# Patient Record
Sex: Female | Born: 1960
Health system: Southern US, Community
[De-identification: ages and names within clinical notes are randomized; demographics above are authoritative.]

## PROBLEM LIST (undated history)

## (undated) DIAGNOSIS — E119 Type 2 diabetes mellitus without complications: Secondary | ICD-10-CM

## (undated) DIAGNOSIS — E7800 Pure hypercholesterolemia, unspecified: Secondary | ICD-10-CM

## (undated) DIAGNOSIS — I10 Essential (primary) hypertension: Secondary | ICD-10-CM

## (undated) DIAGNOSIS — Z862 Personal history of diseases of the blood and blood-forming organs and certain disorders involving the immune mechanism: Secondary | ICD-10-CM

## (undated) DIAGNOSIS — K219 Gastro-esophageal reflux disease without esophagitis: Secondary | ICD-10-CM

## (undated) DIAGNOSIS — Z8719 Personal history of other diseases of the digestive system: Secondary | ICD-10-CM

## (undated) DIAGNOSIS — M272 Inflammatory conditions of jaws: Secondary | ICD-10-CM

## (undated) HISTORY — DX: Type 2 diabetes mellitus without complications: E11.9

## (undated) HISTORY — PX: TUBAL LIGATION: SHX77

## (undated) HISTORY — PX: DENTAL SURGERY: SHX609

## (undated) HISTORY — DX: Pure hypercholesterolemia, unspecified: E78.00

## (undated) NOTE — *Deleted (*Deleted)
Diabetes Mellitus and Nutrition, Adult When you have diabetes (diabetes mellitus), it is very important to have healthy eating habits because your blood sugar (glucose) levels are greatly affected by what you eat and drink. Eating healthy foods in the appropriate amounts, at about the same times every day, can help you:  Control your blood glucose.  Lower your risk of heart disease.  Improve your blood pressure.  Reach or maintain a healthy weight. Every person with diabetes is different, and each person has different needs for a meal plan. Your health care provider may recommend that you work with a diet and nutrition specialist (dietitian) to make a meal plan that is best for you. Your meal plan may vary depending on factors such as:  The calories you need.  The medicines you take.  Your weight.  Your blood glucose, blood pressure, and cholesterol levels.  Your activity level.  Other health conditions you have, such as heart or kidney disease. How do carbohydrates affect me? Carbohydrates, also called carbs, affect your blood glucose level more than any other type of food. Eating carbs naturally raises the amount of glucose in your blood. Carb counting is a method for keeping track of how many carbs you eat. Counting carbs is important to keep your blood glucose at a healthy level, especially if you use insulin or take certain oral diabetes medicines. It is important to know how many carbs you can safely have in each meal. This is different for every person. Your dietitian can help you calculate how many carbs you should have at each meal and for each snack. Foods that contain carbs include:  Bread, cereal, rice, pasta, and crackers.  Potatoes and corn.  Peas, beans, and lentils.  Milk and yogurt.  Fruit and juice.  Desserts, such as cakes, cookies, ice cream, and candy. How does alcohol affect me? Alcohol can cause a sudden decrease in blood glucose (hypoglycemia),  especially if you use insulin or take certain oral diabetes medicines. Hypoglycemia can be a life-threatening condition. Symptoms of hypoglycemia (sleepiness, dizziness, and confusion) are similar to symptoms of having too much alcohol. If your health care provider says that alcohol is safe for you, follow these guidelines:  Limit alcohol intake to no more than 1 drink per day for nonpregnant women and 2 drinks per day for men. One drink equals 12 oz of beer, 5 oz of wine, or 1 oz of hard liquor.  Do not drink on an empty stomach.  Keep yourself hydrated with water, diet soda, or unsweetened iced tea.  Keep in mind that regular soda, juice, and other mixers may contain a lot of sugar and must be counted as carbs. What are tips for following this plan?  Reading food labels  Start by checking the serving size on the "Nutrition Facts" label of packaged foods and drinks. The amount of calories, carbs, fats, and other nutrients listed on the label is based on one serving of the item. Many items contain more than one serving per package.  Check the total grams (g) of carbs in one serving. You can calculate the number of servings of carbs in one serving by dividing the total carbs by 15. For example, if a food has 30 g of total carbs, it would be equal to 2 servings of carbs.  Check the number of grams (g) of saturated and trans fats in one serving. Choose foods that have low or no amount of these fats.  Check the number of   milligrams (mg) of salt (sodium) in one serving. Most people should limit total sodium intake to less than 2,300 mg per day.  Always check the nutrition information of foods labeled as "low-fat" or "nonfat". These foods may be higher in added sugar or refined carbs and should be avoided.  Talk to your dietitian to identify your daily goals for nutrients listed on the label. Shopping  Avoid buying canned, premade, or processed foods. These foods tend to be high in fat, sodium,  and added sugar.  Shop around the outside edge of the grocery store. This includes fresh fruits and vegetables, bulk grains, fresh meats, and fresh dairy. Cooking  Use low-heat cooking methods, such as baking, instead of high-heat cooking methods like deep frying.  Cook using healthy oils, such as olive, canola, or sunflower oil.  Avoid cooking with butter, cream, or high-fat meats. Meal planning  Eat meals and snacks regularly, preferably at the same times every day. Avoid going long periods of time without eating.  Eat foods high in fiber, such as fresh fruits, vegetables, beans, and whole grains. Talk to your dietitian about how many servings of carbs you can eat at each meal.  Eat 4-6 ounces (oz) of lean protein each day, such as lean meat, chicken, fish, eggs, or tofu. One oz of lean protein is equal to: ? 1 oz of meat, chicken, or fish. ? 1 egg. ?  cup of tofu.  Eat some foods each day that contain healthy fats, such as avocado, nuts, seeds, and fish. Lifestyle  Check your blood glucose regularly.  Exercise regularly as told by your health care provider. This may include: ? 150 minutes of moderate-intensity or vigorous-intensity exercise each week. This could be brisk walking, biking, or water aerobics. ? Stretching and doing strength exercises, such as yoga or weightlifting, at least 2 times a week.  Take medicines as told by your health care provider.  Do not use any products that contain nicotine or tobacco, such as cigarettes and e-cigarettes. If you need help quitting, ask your health care provider.  Work with a counselor or diabetes educator to identify strategies to manage stress and any emotional and social challenges. Questions to ask a health care provider  Do I need to meet with a diabetes educator?  Do I need to meet with a dietitian?  What number can I call if I have questions?  When are the best times to check my blood glucose? Where to find more  information:  American Diabetes Association: diabetes.org  Academy of Nutrition and Dietetics: www.eatright.org  National Institute of Diabetes and Digestive and Kidney Diseases (NIH): www.niddk.nih.gov Summary  A healthy meal plan will help you control your blood glucose and maintain a healthy lifestyle.  Working with a diet and nutrition specialist (dietitian) can help you make a meal plan that is best for you.  Keep in mind that carbohydrates (carbs) and alcohol have immediate effects on your blood glucose levels. It is important to count carbs and to use alcohol carefully. This information is not intended to replace advice given to you by your health care provider. Make sure you discuss any questions you have with your health care provider. Document Revised: 07/12/2017 Document Reviewed: 09/03/2016 Elsevier Patient Education  2020 Elsevier Inc.  

---

## 1998-03-17 ENCOUNTER — Emergency Department (HOSPITAL_COMMUNITY): Admission: EM | Admit: 1998-03-17 | Discharge: 1998-03-17 | Payer: Self-pay | Admitting: Emergency Medicine

## 2009-08-13 HISTORY — PX: ESOPHAGOGASTRODUODENOSCOPY: SHX1529

## 2010-01-04 ENCOUNTER — Ambulatory Visit: Payer: Self-pay | Admitting: Internal Medicine

## 2010-01-04 ENCOUNTER — Inpatient Hospital Stay (HOSPITAL_COMMUNITY): Admission: EM | Admit: 2010-01-04 | Discharge: 2010-01-06 | Payer: Self-pay | Admitting: Emergency Medicine

## 2010-01-05 ENCOUNTER — Encounter (INDEPENDENT_AMBULATORY_CARE_PROVIDER_SITE_OTHER): Payer: Self-pay | Admitting: Internal Medicine

## 2010-01-05 ENCOUNTER — Ambulatory Visit: Payer: Self-pay | Admitting: Internal Medicine

## 2010-01-12 ENCOUNTER — Encounter: Payer: Self-pay | Admitting: Internal Medicine

## 2010-02-06 ENCOUNTER — Ambulatory Visit: Payer: Self-pay | Admitting: Urgent Care

## 2010-02-06 DIAGNOSIS — B3781 Candidal esophagitis: Secondary | ICD-10-CM | POA: Insufficient documentation

## 2010-02-06 DIAGNOSIS — A048 Other specified bacterial intestinal infections: Secondary | ICD-10-CM | POA: Insufficient documentation

## 2010-02-06 DIAGNOSIS — K279 Peptic ulcer, site unspecified, unspecified as acute or chronic, without hemorrhage or perforation: Secondary | ICD-10-CM

## 2010-02-06 DIAGNOSIS — D509 Iron deficiency anemia, unspecified: Secondary | ICD-10-CM | POA: Insufficient documentation

## 2010-03-02 ENCOUNTER — Encounter: Payer: Self-pay | Admitting: Internal Medicine

## 2010-03-03 ENCOUNTER — Encounter: Payer: Self-pay | Admitting: Internal Medicine

## 2010-03-17 ENCOUNTER — Encounter: Payer: Self-pay | Admitting: Internal Medicine

## 2010-04-20 ENCOUNTER — Encounter (INDEPENDENT_AMBULATORY_CARE_PROVIDER_SITE_OTHER): Payer: Self-pay | Admitting: *Deleted

## 2010-07-18 ENCOUNTER — Encounter: Payer: Self-pay | Admitting: Urgent Care

## 2010-09-12 NOTE — Assessment & Plan Note (Signed)
Summary: HOS F/U [id number]   Primary Care Provider:  Rande Lawman, [facility]  Chief Complaint:  follow up from hosp- anemia.  History of Present Illness: A 50 year old lady recently admitted to the hospital with profound iron-deficiency anemia and melena in the setting of frequent aspirin ingestion.  Found to have PUD, candida esophagitis, and h pylori on recent EGD.  She had multiple gastric (antral) ulcerations, likely benign and related to nonsteroidal use, and mulltiple small bulbar erosions and ulcerations as described above.  [facility]  No NSAIDs.  Finished diflucan.  Has not started meds for h pylori yet, but states it's too expensive.  Denies dysphagia and odynophagia.  c/o occ sharp right-sided transient abd pain.  Taking SS Tonic liquid iron daily.  c/o nausea after that.  Denies vomiting.  c/o water brash.  Protonin 40mg  two times a day.  Denies heartburn, butr has indigestion.  BM good.  LMP last week w/ heavy menses.  Has always had heavy periods.    Current Problems (verified): 1)  Helicobacter Pylori Infection  (ICD-041.86) 2)  Candidiasis, Esophageal  (ICD-112.84) 3)  Pud  (ICD-533.90) 4)  Anemia, Iron Deficiency  (ICD-280.9)  Current Medications (verified): 1)  Ss Tonic .Marland Kitchen.. 3 Tablespoons Daily 2)  Multivitamin .... Once Daily 3)  Protonix 40 Mg Tbec (Pantoprazole Sodium) .... Two Times A Day  Allergies (verified): 1)  ! Diona Fanti  Past History:  Past Medical History: HELICOBACTER PYLORI INFECTION (ICD-041.86) CANDIDIASIS, ESOPHAGEAL (ICD-112.84) PUD (ICD-533.90) ANEMIA, IRON DEFICIENCY (ICD-280.9)  Past Surgical History: tubal ligation  Family History: No known family history of colorectal carcinoma, IBD, liver or chronic GI problems.  Social History: She is single.  She has three children.  She works at [employer].  She smokes.  When I asked her how much she smokes she stated it just depends.  No alcohol or drug  use.      Review of Systems      See HPI General:  Denies fever, chills, sweats, anorexia, fatigue, weakness, malaise, weight loss, and sleep disorder. CV:  Denies chest pains, angina, palpitations, syncope, dyspnea on exertion, orthopnea, PND, peripheral edema, and claudication. Resp:  Denies dyspnea at rest, dyspnea with exercise, cough, sputum, wheezing, coughing up blood, and pleurisy. GI:  Denies difficulty swallowing, pain on swallowing, nausea, indigestion/heartburn, vomiting, vomiting blood, abdominal pain, jaundice, gas/bloating, diarrhea, constipation, change in bowel habits, bloody BM's, black BMs, and fecal incontinence. GU:  Complains of abnormal vaginal bleeding; denies urinary burning, blood in urine, nocturnal urination, urinary frequency, urinary incontinence, amenorrhea, vaginal discharge, and pelvic pain. Derm:  Denies rash, itching, dry skin, hives, moles, warts, and unhealing ulcers. Psych:  Denies depression, anxiety, memory loss, suicidal ideation, hallucinations, paranoia, phobia, and confusion. Heme:  Denies bruising, bleeding, and enlarged lymph nodes.  Vital Signs:  Patient profile:   50 year old female Height:      67.5 inches Weight:      153 pounds BMI:     23.69 Temp:     98.0 degrees F oral Pulse rate:   88 / minute BP sitting:   128 / 82  (left arm) Cuff size:   regular  Vitals Entered By: Burnadette Peter LPN (June 27, 624THL QA348G PM)  Physical Exam  General:  Well developed, well nourished, no acute distress. Head:  Normocephalic and atraumatic. Eyes:  Sclera clear, no icterus. Ears:  Normal auditory acuity. Nose:  No deformity, discharge,  or lesions. Mouth:  No deformity or lesions, dentition normal. Neck:  Supple; no masses or thyromegaly. Lungs:  Clear throughout to auscultation. Heart:  Regular rate and rhythm; no murmurs, rubs,  or bruits. Abdomen:  Soft, nontender and nondistended. No masses, hepatosplenomegaly or hernias noted. Normal bowel  sounds.without guarding and without rebound.   Msk:  Symmetrical with no gross deformities. Normal posture. Pulses:  Normal pulses noted. Extremities:  No clubbing, cyanosis, edema or deformities noted. Neurologic:  Alert and  oriented x4;  grossly normal neurologically. Skin:  Intact without significant lesions or rashes. Cervical Nodes:  No significant cervical adenopathy. Psych:  Alert and cooperative. Normal mood and affect.  Impression & Recommendations:  Problem # 1:  HELICOBACTER PYLORI INFECTION (ICD-041.86) 50 y/o [place] female recently admitted w/ profound IDA, candida esophagitis, h pylori & PUD.  She also has menorrhagia which may be contributing to IDA as well.  She has completed diflucan, will need h pylori treatment, followed by FU EGD to verify healing and colonoscopy to r/o malignancy in 04/2010.  Orders: Est. Patient Level III [id number])  Problem # 2:  PUD (ICD-533.90) See #1  Problem # 3:  ANEMIA, IRON DEFICIENCY (ICD-280.9) See #1  Patient Instructions: 1)  PYLERA 3 capsules at Breakfast, lunch, dinner & bedtime for 10 days (samples given) 2)  PRILOSEC 20mg  before breakfast & dinner for 10 days (samples) 3)  Hold PROTONIX while on omeprazole, then resume 4)  Have copy of labwork faxed to [place] 5)  EGD and colonoscopy in 04/2010 6)  The medication list was reviewed and reconciled.  All changed / newly prescribed medications were explained.  A complete medication list was provided to the patient / caregiver. Prescriptions: PROTONIX 40 MG TBEC (PANTOPRAZOLE SODIUM) two times a day  #60 x 2   Entered and Authorized by:   Andria Meuse FNP-BC   Signed by:   Andria Meuse FNP-BC on 02/06/2010   Method used:   Electronically to        [facility] (retail)       [address]       [place], [place]  [address]       Ph: [phone]       Fax: [phone]   RxID:   [id number]

## 2010-09-12 NOTE — Consult Note (Signed)
Summary: Consultation  NAME:  Sydney Morrow, Sydney Morrow               ACCOUNT NO.:  [id number]      MEDICAL RECORD NO.:  [id number]          PATIENT TYPE:  INP      LOCATION:  A309                          FACILITY:  [facility]      PHYSICIAN:  R. Garfield Cornea, M.D. DATE OF BIRTH:  October 17, 1960      DATE OF CONSULTATION:  01/04/2010   DATE OF DISCHARGE:                                    CONSULTATION      REASON FOR CONSULTATION:  GI bleed.      PHYSICIAN REQUESTING CONSULTATION:  Tesfaye D. Legrand Rams, MD.      HISTORY OF PRESENT ILLNESS:  The patient is a 50 year old African   American female who presented to the hospital with chief complaint, I am   just not feeling well.  She has had a couple episodes of vomiting.   Overall she just states that she feels lousy.  She has been feeling   tired and lightheaded.  She has had some increased reflux and some   abdominal discomfort especially with meals.  Her history is somewhat   vague.  She relates most of her symptoms since eating some steak at   [employer] over the weekend.  She admits to taking quite a bit of Galva for abdominal discomfort anywhere from 5 or 6 daily.  She is not   really clear on how long she has been doing this.  She really denies any   constipation and diarrhea.  She has not noticed any melena or rectal   bleeding.  She has never had an EGD or colonoscopy.      In the emergency department she was evaluated and found to have a   hemoglobin 3.9, hematocrit 12.4, MCV 63.9.  On digital rectal exam she   had black heme-positive stools.  She is scheduled to get 4 units of   packed red blood cells, the first unit is currently being transfused.      MEDICATIONS AT HOME:   1. Alka-Seltzer, several daily as outlined above.   2. Tylenol p.r.n.      ALLERGIES:  SHE STATES SHE IS ALLERGIC TO ASPIRIN WHICH CAUSES HER TO   FEEL NUMB IN THE FACE IF SHE TAKES TOO MUCH.      PAST MEDICAL HISTORY:  Seasonal allergies.      PAST SURGICAL  HISTORY:  Tubal ligation.      FAMILY HISTORY:  Negative for chronic GI illnesses, colorectal cancer or   liver disease.      SOCIAL HISTORY:  She is single.  She has three children.  She works at   [employer].  She smokes.  When I asked her how much she smokes she stated it   just depends.  No alcohol or drug use.      REVIEW OF SYSTEMS:  See HPI for GI and CONSTITUTIONAL.  CARDIOPULMONARY:   She has had some dyspnea on exertion.  No chest pain, palpitations or   cough.  GENITOURINARY:  No dysuria, hematuria.  GU:  She states her  menstrual cycles are fairly regular about once a month and not heavy   without any blood clots.      PHYSICAL EXAM:  Temp 97.6, pulse 108, respirations 18, blood pressure   139/76, O2 saturations 91% on room air.   GENERAL:  Pleasant well-nourished, well-developed black female in no   acute distress.   SKIN:  Warm and dry.  No jaundice.   HEENT:  Sclerae nonicteric.  Oropharyngeal mucosa moist and pink.   Exophthalmus bilaterally.  No lesions, erythema or exudate.  No   lymphadenopathy, thyromegaly.   CHEST:  Lungs are clear to auscultation.   CARDIAC:  Exam reveals regular rate and rhythm.  No murmurs, rubs or   gallops.   ABDOMEN:  Positive bowel sounds.  Abdomen soft.  She has mild epigastric   tenderness to deep palpation.  No rebound or guarding.  No organomegaly   or masses.  No abdominal bruits or hernias.   LOWER EXTREMITIES:  No edema.      LABORATORIES:  As mentioned above.  In addition her white count is 6100,   platelets 341,000, amylase 51.  Sodium 133, potassium 3.1, BUN 10,   creatinine 0.84, glucose 136, total bilirubin 0.3, alk phos 52, AST 21,   ALT 17, albumin 3.4, lipase 32.      IMPRESSION:  The patient is a 50-year lady who presents with profound   microcytic anemia with vague upper gastrointestinal symptoms and   gastroesophageal reflux disease.  She has been self medicating with   multiple Alka-Seltzers daily which contain   aspirin.  Her microcytic   indices would suggest that she has had a chronic gastrointestinal bleed.   On digital rectal exam she is noted to have black heme-positive stools.   Suspect upper gastrointestinal source as etiology.      RECOMMENDATIONS:   1. Esophagogastroduodenoscopy in the morning.   2. Proton pump inhibitor.   3. Transfuse as needed.   4. She will need a colonoscopy at a later date, timing based on       findings of esophagogastroduodenoscopy.   5. Replete potassium.   6. Check TSH.               Neil Crouch, P.A.         ______________________________   R. Garfield Cornea, M.D.

## 2010-09-12 NOTE — Letter (Signed)
Summary: Recall Colonoscopy/Endoscopy, Change to Office Visit  [facility]  [address]   [place], [place] [address]   Phone: [phone]  Fax: [phone]      April 20, 2010   Sydney Morrow [address] [place], [place]  [address] 1960-12-25   Dear Ms. Parrott,   According to our records, it is time for you to schedule a Colonoscopy/Endoscopy. However, after reviewing your medical record, we recommend an office visit in order to determine your need for a repeat procedure.  Please call [phone] at your convenience to schedule an office visit. If you have any questions or concerns, please feel free to contact our office.   Sincerely,   [facility] Ph: [phone]   Fax: [phone]  Appended Document: Recall Colonoscopy/Endoscopy, Change to Office Visit Invalid address..I tried to call pt and number listed is disconnected.

## 2010-09-12 NOTE — Letter (Signed)
Summary: DISABILITY DETERMINATION  DISABILITY DETERMINATION   Imported By: Hoy Morn 03/17/2010 13:53:07  _____________________________________________________________________  External Attachment:    Type:   Image     Comment:   External Document

## 2010-09-12 NOTE — Letter (Signed)
Summary: disability form  disability form   Imported By: Orma Flaming 03/03/2010 16:33:58  _____________________________________________________________________  External Attachment:    Type:   Image     Comment:   External Document

## 2010-09-12 NOTE — Medication Information (Signed)
Summary: PROTONIX 40MG   PROTONIX 40MG    Imported By: Hoy Morn 07/18/2010 13:44:07  _____________________________________________________________________  External Attachment:    Type:   Image     Comment:   External Document  Appended Document: PROTONIX 40MG     Prescriptions: PROTONIX 40 MG TBEC (PANTOPRAZOLE SODIUM) two times a day  #60 x 5   Entered and Authorized by:   Andria Meuse FNP-BC   Signed by:   Andria Meuse FNP-BC on 07/18/2010   Method used:   Electronically to        [facility] (retail)       [address]       [place], [place]  [address]       Ph: [phone]       Fax: [phone]   RxID:   [id number]

## 2010-09-12 NOTE — Op Note (Signed)
Summary: EGD w/ BX  NAME:  Sydney Morrow, Sydney Morrow               ACCOUNT NO.:  [id number]      MEDICAL RECORD NO.:  [id number]          PATIENT TYPE:  INP      LOCATION:  A309                          FACILITY:  [facility]      PHYSICIAN:  R. Garfield Cornea, M.D. DATE OF BIRTH:  03-Dec-1960      DATE OF PROCEDURE:   DATE OF DISCHARGE:                                  OPERATIVE REPORT      EGD with gastric biopsy and esophageal brushing for KOH prep.      INDICATIONS FOR PROCEDURE:  A 50 year old lady admitted to the hospital   with profound iron-deficiency anemia and melena in the setting of   frequent aspirin ingestion.  Hemoglobin in 3 range on admission.  She   received 4 units of packed red blood cells overnight.  Hemoglobin 8.4   this morning.  She remains entirely stable.  EGD now being done.  Risks,   benefits, alternatives, limitations, and imponderables have been   discussed, questions answered.  Please see the documentation in the   medical record.      PROCEDURE NOTE:  O2 saturation, blood pressure, pulse, respirations   monitored throughout the entirety of the procedure.      CONSCIOUS SEDATION:  Versed 5 mg IV, Demerol 125 mg IV in divided doses.      INSTRUMENT:  Pentax video chip system.      Cetacaine spray for topical oropharyngeal anesthesia.      FINDINGS:  Examination of the tubular esophagus revealed white plaques   throughout the esophagus that were fixed, more prominent in the distal   one-third of the esophagus.  Please see photos.  Mucosa was somewhat   friable underneath these areas.  EG junction easily traversed.      Stomach:  Gastric cavity was emptied.  It insufflated well with air and   thorough examination of the gastric mucosa including retroflexion in the   proximal stomach esophagogastric junction demonstrated multiple antral   erosions, the largest being approximately 1.25 cm.  It was deep yet   there were at least two other satellite ulcers.  They were  somewhat   smaller.  All had a clean base.  They appeared to be benign.  The   pylorus was patent, easily traversed.  Examination of the bulb and   second portion revealed multiple 3 to 4 mm bulbar ulcerations with   satellite erosions.  Otherwise, D1 and D2 appeared unremarkable.      Therapeutic/diagnostic maneuvers performed:  Antral biopsies of gastric   mucosa were taken to rule out H. pylori.  Subsequently a KOH brushing of   the distal esophageal plaques was taken.  The patient tolerated the   procedure well and was reactive after endoscopy.      IMPRESSION:   1. White, raised, somewhat fixed plaques esophageal mucosa concerning       for possible Candida esophagitis status post brushing.   2. Multiple gastric (antral) ulcerations, likely benign and related to  nonsteroidal use requiring no intervention at this time.   3. Status post gastric biopsy to rule out Helicobacter pylori.       Multiple small bulbar erosions and ulcerations as described above.      RECOMMENDATIONS:   1. Advance diet.  B.i.d. proton pump inhibitor therapy for the next 12       weeks.   2. Follow up on path.   3. Return in 12 weeks for follow up EGD to assess ulcer healing and       for first ever colonoscopy.   4. The patient is to absolutely avoid aspirin and NSAID products from       here on out.  Follow up on pending studies.  Further       recommendations to follow.               Bridgette Habermann, M.D.      Appended Document: EGD w/ BX pt has appt on 02/06/10 @ 2:30pm w/KJ

## 2010-10-30 LAB — CROSSMATCH

## 2010-10-30 LAB — COMPREHENSIVE METABOLIC PANEL
ALT: 17 U/L (ref 0–35)
Alkaline Phosphatase: 52 U/L (ref 39–117)
CO2: 25 mEq/L (ref 19–32)
Chloride: 98 mEq/L (ref 96–112)
GFR calc non Af Amer: >60 mL/min (ref >60)
Glucose, Bld: 136 mg/dL — ABNORMAL HIGH (ref 70–99)
Potassium: 3.1 mEq/L — ABNORMAL LOW (ref 3.5–5.1)
Sodium: 133 mEq/L — ABNORMAL LOW (ref 135–145)
Total Bilirubin: 0.3 mg/dL (ref 0.3–1.2)

## 2010-10-30 LAB — DIFFERENTIAL
Band Neutrophils: 0 % (ref 0–10)
Basophils Absolute: 0 10*3/uL (ref 0.0–0.1)
Basophils Absolute: 0 10*3/uL (ref 0.0–0.1)
Basophils Absolute: 0 10*3/uL (ref 0.0–0.1)
Basophils Relative: 0 % (ref 0–1)
Basophils Relative: 1 % (ref 0–1)
Eosinophils Absolute: 0 10*3/uL (ref 0.0–0.7)
Eosinophils Absolute: 0 10*3/uL (ref 0.0–0.7)
Eosinophils Relative: 0 % (ref 0–5)
Eosinophils Relative: 0 % (ref 0–5)
Lymphs Abs: 0.8 10*3/uL (ref 0.7–4.0)
Lymphs Abs: 2.4 10*3/uL (ref 0.7–4.0)
Monocytes Absolute: 0.6 10*3/uL (ref 0.1–1.0)
Monocytes Absolute: 0.7 10*3/uL (ref 0.1–1.0)
Monocytes Relative: 12 % (ref 3–12)
Myelocytes: 0 %
Neutro Abs: 4.1 10*3/uL (ref 1.7–7.7)
Neutro Abs: 4.6 10*3/uL (ref 1.7–7.7)
Neutrophils Relative %: 60 % (ref 43–77)
Promyelocytes Absolute: 0 %

## 2010-10-30 LAB — CBC
HCT: 25.9 % — ABNORMAL LOW (ref 36.0–46.0)
Hemoglobin: 3.9 g/dL — CL (ref 12.0–15.0)
Hemoglobin: 8.3 g/dL — ABNORMAL LOW (ref 12.0–15.0)
Hemoglobin: 8.4 g/dL — ABNORMAL LOW (ref 12.0–15.0)
MCHC: 30.4 g/dL (ref 30.0–36.0)
MCHC: 31.3 g/dL (ref 30.0–36.0)
MCHC: 32.4 g/dL (ref 30.0–36.0)
MCV: 77.5 fL — ABNORMAL LOW (ref 78.0–100.0)
MCV: 78.6 fL (ref 78.0–100.0)
RBC: 1.94 MIL/uL — ABNORMAL LOW (ref 3.87–5.11)
RBC: 3.34 MIL/uL — ABNORMAL LOW (ref 3.87–5.11)
RBC: 3.49 MIL/uL — ABNORMAL LOW (ref 3.87–5.11)
RDW: 25.4 % — ABNORMAL HIGH (ref 11.5–15.5)
WBC: 6.1 10*3/uL (ref 4.0–10.5)
WBC: 6.7 10*3/uL (ref 4.0–10.5)

## 2010-10-30 LAB — BASIC METABOLIC PANEL
CO2: 25 mEq/L (ref 19–32)
Calcium: 8.4 mg/dL (ref 8.4–10.5)
Chloride: 105 mEq/L (ref 96–112)
GFR calc Af Amer: >60 mL/min (ref >60)
Glucose, Bld: 108 mg/dL — ABNORMAL HIGH (ref 70–99)
Sodium: 136 mEq/L (ref 135–145)

## 2010-10-30 LAB — RETICULOCYTES
RBC.: 1.95 MIL/uL — ABNORMAL LOW (ref 3.87–5.11)
Retic Count, Absolute: 136.5 10*3/uL (ref 19.0–186.0)

## 2010-10-30 LAB — GLUCOSE, CAPILLARY

## 2010-10-30 LAB — FERRITIN: Ferritin: 3 ng/mL — ABNORMAL LOW (ref 10–291)

## 2010-10-30 LAB — FOLATE: Folate: 7.9 ng/mL

## 2010-10-30 LAB — KOH PREP

## 2010-10-30 LAB — IRON AND TIBC: UIBC: 459 ug/dL

## 2010-11-09 ENCOUNTER — Other Ambulatory Visit (HOSPITAL_COMMUNITY): Payer: Self-pay | Admitting: Family Medicine

## 2010-11-09 DIAGNOSIS — Z139 Encounter for screening, unspecified: Secondary | ICD-10-CM

## 2010-11-20 ENCOUNTER — Ambulatory Visit (HOSPITAL_COMMUNITY): Payer: PRIVATE HEALTH INSURANCE

## 2010-12-05 ENCOUNTER — Ambulatory Visit (HOSPITAL_COMMUNITY): Payer: PRIVATE HEALTH INSURANCE

## 2010-12-07 ENCOUNTER — Ambulatory Visit (HOSPITAL_COMMUNITY)
Admission: RE | Admit: 2010-12-07 | Discharge: 2010-12-07 | Disposition: A | Discharge status: Home / Self Care | Payer: PRIVATE HEALTH INSURANCE | Source: Ambulatory Visit | Attending: Family Medicine | Admitting: Family Medicine

## 2010-12-07 DIAGNOSIS — Z139 Encounter for screening, unspecified: Secondary | ICD-10-CM

## 2012-06-26 ENCOUNTER — Other Ambulatory Visit (HOSPITAL_COMMUNITY): Payer: Self-pay | Admitting: Nurse Practitioner

## 2012-06-26 DIAGNOSIS — Z139 Encounter for screening, unspecified: Secondary | ICD-10-CM

## 2012-07-01 ENCOUNTER — Ambulatory Visit (HOSPITAL_COMMUNITY): Payer: PRIVATE HEALTH INSURANCE

## 2012-07-24 ENCOUNTER — Ambulatory Visit (HOSPITAL_COMMUNITY): Payer: PRIVATE HEALTH INSURANCE

## 2012-07-25 ENCOUNTER — Ambulatory Visit (HOSPITAL_COMMUNITY): Payer: PRIVATE HEALTH INSURANCE

## 2017-01-29 ENCOUNTER — Encounter (HOSPITAL_COMMUNITY): Payer: Self-pay | Admitting: Emergency Medicine

## 2017-01-29 ENCOUNTER — Inpatient Hospital Stay (HOSPITAL_COMMUNITY)
Admission: EM | Admit: 2017-01-29 | Discharge: 2017-02-04 | DRG: 131 | Disposition: A | Discharge status: Home / Self Care | Payer: Self-pay | Attending: Internal Medicine | Admitting: Internal Medicine

## 2017-01-29 ENCOUNTER — Emergency Department (HOSPITAL_COMMUNITY): Payer: Self-pay

## 2017-01-29 DIAGNOSIS — I10 Essential (primary) hypertension: Secondary | ICD-10-CM | POA: Diagnosis present

## 2017-01-29 DIAGNOSIS — M272 Inflammatory conditions of jaws: Secondary | ICD-10-CM | POA: Diagnosis present

## 2017-01-29 DIAGNOSIS — K219 Gastro-esophageal reflux disease without esophagitis: Secondary | ICD-10-CM | POA: Diagnosis present

## 2017-01-29 DIAGNOSIS — F17210 Nicotine dependence, cigarettes, uncomplicated: Secondary | ICD-10-CM | POA: Diagnosis present

## 2017-01-29 DIAGNOSIS — K029 Dental caries, unspecified: Secondary | ICD-10-CM | POA: Diagnosis present

## 2017-01-29 DIAGNOSIS — L03211 Cellulitis of face: Secondary | ICD-10-CM

## 2017-01-29 DIAGNOSIS — Z79899 Other long term (current) drug therapy: Secondary | ICD-10-CM

## 2017-01-29 DIAGNOSIS — K047 Periapical abscess without sinus: Principal | ICD-10-CM | POA: Diagnosis present

## 2017-01-29 DIAGNOSIS — Z8249 Family history of ischemic heart disease and other diseases of the circulatory system: Secondary | ICD-10-CM

## 2017-01-29 DIAGNOSIS — E785 Hyperlipidemia, unspecified: Secondary | ICD-10-CM | POA: Diagnosis present

## 2017-01-29 DIAGNOSIS — R7309 Other abnormal glucose: Secondary | ICD-10-CM

## 2017-01-29 DIAGNOSIS — K122 Cellulitis and abscess of mouth: Secondary | ICD-10-CM

## 2017-01-29 DIAGNOSIS — E1165 Type 2 diabetes mellitus with hyperglycemia: Secondary | ICD-10-CM | POA: Diagnosis present

## 2017-01-29 DIAGNOSIS — R590 Localized enlarged lymph nodes: Secondary | ICD-10-CM | POA: Diagnosis present

## 2017-01-29 HISTORY — DX: Gastro-esophageal reflux disease without esophagitis: K21.9

## 2017-01-29 HISTORY — DX: Essential (primary) hypertension: I10

## 2017-01-29 HISTORY — DX: Personal history of diseases of the blood and blood-forming organs and certain disorders involving the immune mechanism: Z86.2

## 2017-01-29 HISTORY — DX: Inflammatory conditions of jaws: M27.2

## 2017-01-29 LAB — BASIC METABOLIC PANEL
ANION GAP: 13 (ref 5–15)
BUN: 13 mg/dL (ref 6–20)
CALCIUM: 9.3 mg/dL (ref 8.9–10.3)
CO2: 22 mmol/L (ref 22–32)
CREATININE: 1.02 mg/dL — AB (ref 0.44–1.00)
Chloride: 98 mmol/L — ABNORMAL LOW (ref 101–111)
Glucose, Bld: 266 mg/dL — ABNORMAL HIGH (ref 65–99)
Potassium: 4.1 mmol/L (ref 3.5–5.1)
SODIUM: 133 mmol/L — AB (ref 135–145)

## 2017-01-29 LAB — CBC WITH DIFFERENTIAL/PLATELET
BASOS ABS: 0 10*3/uL (ref 0.0–0.1)
BASOS PCT: 0 %
EOS ABS: 0 10*3/uL (ref 0.0–0.7)
Eosinophils Relative: 0 %
HEMATOCRIT: 42.1 % (ref 36.0–46.0)
HEMOGLOBIN: 14.6 g/dL (ref 12.0–15.0)
Lymphocytes Relative: 21 %
Lymphs Abs: 2.2 10*3/uL (ref 0.7–4.0)
MCH: 31.4 pg (ref 26.0–34.0)
MCHC: 34.7 g/dL (ref 30.0–36.0)
MCV: 90.5 fL (ref 78.0–100.0)
Monocytes Absolute: 0.6 10*3/uL (ref 0.1–1.0)
Monocytes Relative: 6 %
NEUTROS ABS: 7.8 10*3/uL — AB (ref 1.7–7.7)
NEUTROS PCT: 73 %
Platelets: 281 10*3/uL (ref 150–400)
RBC: 4.65 MIL/uL (ref 3.87–5.11)
RDW: 12.4 % (ref 11.5–15.5)
WBC: 10.7 10*3/uL — AB (ref 4.0–10.5)

## 2017-01-29 LAB — GLUCOSE, CAPILLARY
GLUCOSE-CAPILLARY: 225 mg/dL — AB (ref 65–99)
GLUCOSE-CAPILLARY: 293 mg/dL — AB (ref 65–99)

## 2017-01-29 LAB — CBC
HEMATOCRIT: 42.7 % (ref 36.0–46.0)
HEMOGLOBIN: 14.7 g/dL (ref 12.0–15.0)
MCH: 31.4 pg (ref 26.0–34.0)
MCHC: 34.4 g/dL (ref 30.0–36.0)
MCV: 91.2 fL (ref 78.0–100.0)
Platelets: 294 10*3/uL (ref 150–400)
RBC: 4.68 MIL/uL (ref 3.87–5.11)
RDW: 12.5 % (ref 11.5–15.5)
WBC: 10.2 10*3/uL (ref 4.0–10.5)

## 2017-01-29 LAB — I-STAT CG4 LACTIC ACID, ED: LACTIC ACID, VENOUS: 1.45 mmol/L (ref 0.5–1.9)

## 2017-01-29 LAB — CREATININE, SERUM
CREATININE: 1.05 mg/dL — AB (ref 0.44–1.00)
GFR calc Af Amer: >60 mL/min (ref >60)
GFR, EST NON AFRICAN AMERICAN: 58 mL/min — AB (ref >60)

## 2017-01-29 MED ORDER — IOPAMIDOL (ISOVUE-300) INJECTION 61%
100.0000 mL | Freq: Once | INTRAVENOUS | Status: AC | PRN
  Administered 2017-01-29: 75 mL via INTRAVENOUS

## 2017-01-29 MED ORDER — FENTANYL CITRATE (PF) 100 MCG/2ML IJ SOLN
INTRAMUSCULAR | Status: AC
  Filled 2017-01-29: qty 2

## 2017-01-29 MED ORDER — METOPROLOL TARTRATE 50 MG PO TABS
50.0000 mg | ORAL_TABLET | Freq: Two times a day (BID) | ORAL | Status: DC
  Administered 2017-01-29 – 2017-02-01 (×6): 50 mg via ORAL
  Filled 2017-01-29 (×6): qty 1

## 2017-01-29 MED ORDER — FENTANYL CITRATE (PF) 100 MCG/2ML IJ SOLN
50.0000 ug | Freq: Once | INTRAMUSCULAR | Status: AC
  Administered 2017-01-29: 50 ug via INTRAVENOUS

## 2017-01-29 MED ORDER — HYDRALAZINE HCL 20 MG/ML IJ SOLN
10.0000 mg | INTRAMUSCULAR | Status: DC | PRN
  Administered 2017-02-01 – 2017-02-03 (×3): 10 mg via INTRAVENOUS
  Filled 2017-01-29 (×3): qty 1

## 2017-01-29 MED ORDER — ONDANSETRON HCL 4 MG/2ML IJ SOLN: 4.0000 mg | Freq: Four times a day (QID) | INTRAMUSCULAR | Status: DC | PRN

## 2017-01-29 MED ORDER — ONDANSETRON HCL 4 MG PO TABS: 4.0000 mg | ORAL_TABLET | Freq: Four times a day (QID) | ORAL | Status: DC | PRN

## 2017-01-29 MED ORDER — LOSARTAN POTASSIUM 25 MG PO TABS
12.5000 mg | ORAL_TABLET | Freq: Every day | ORAL | Status: DC
  Administered 2017-01-29 – 2017-01-31 (×3): 12.5 mg via ORAL
  Filled 2017-01-29 (×3): qty 0.5

## 2017-01-29 MED ORDER — ACETAMINOPHEN 650 MG RE SUPP: 650.0000 mg | Freq: Four times a day (QID) | RECTAL | Status: DC | PRN

## 2017-01-29 MED ORDER — SODIUM CHLORIDE 0.9 % IV SOLN
INTRAVENOUS | Status: DC
  Administered 2017-01-29 – 2017-02-03 (×7): via INTRAVENOUS

## 2017-01-29 MED ORDER — ACETAMINOPHEN 325 MG PO TABS
650.0000 mg | ORAL_TABLET | Freq: Four times a day (QID) | ORAL | Status: DC | PRN
  Administered 2017-01-29 – 2017-02-02 (×4): 650 mg via ORAL
  Filled 2017-01-29 (×5): qty 2

## 2017-01-29 MED ORDER — INSULIN ASPART 100 UNIT/ML ~~LOC~~ SOLN
3.0000 [IU] | Freq: Three times a day (TID) | SUBCUTANEOUS | Status: DC
  Administered 2017-01-29 – 2017-01-31 (×6): 3 [IU] via SUBCUTANEOUS

## 2017-01-29 MED ORDER — SENNOSIDES-DOCUSATE SODIUM 8.6-50 MG PO TABS: 1.0000 | ORAL_TABLET | Freq: Every evening | ORAL | Status: DC | PRN

## 2017-01-29 MED ORDER — CLINDAMYCIN PHOSPHATE 600 MG/50ML IV SOLN
600.0000 mg | Freq: Once | INTRAVENOUS | Status: AC
  Administered 2017-01-29: 600 mg via INTRAVENOUS
  Filled 2017-01-29: qty 50

## 2017-01-29 MED ORDER — SODIUM CHLORIDE 0.9 % IV BOLUS (SEPSIS)
500.0000 mL | Freq: Once | INTRAVENOUS | Status: AC
  Administered 2017-01-29: 500 mL via INTRAVENOUS

## 2017-01-29 MED ORDER — ENOXAPARIN SODIUM 40 MG/0.4ML ~~LOC~~ SOLN
40.0000 mg | SUBCUTANEOUS | Status: DC
  Administered 2017-01-29 – 2017-02-02 (×3): 40 mg via SUBCUTANEOUS
  Filled 2017-01-29 (×5): qty 0.4

## 2017-01-29 MED ORDER — INSULIN ASPART 100 UNIT/ML ~~LOC~~ SOLN
0.0000 [IU] | Freq: Every day | SUBCUTANEOUS | Status: DC
  Administered 2017-01-29: 3 [IU] via SUBCUTANEOUS
  Administered 2017-01-30 – 2017-02-02 (×2): 2 [IU] via SUBCUTANEOUS

## 2017-01-29 MED ORDER — FENTANYL CITRATE (PF) 100 MCG/2ML IJ SOLN
50.0000 ug | Freq: Once | INTRAMUSCULAR | Status: AC
  Administered 2017-01-29: 50 ug via INTRAVENOUS
  Filled 2017-01-29: qty 2

## 2017-01-29 MED ORDER — INSULIN ASPART 100 UNIT/ML ~~LOC~~ SOLN
0.0000 [IU] | Freq: Three times a day (TID) | SUBCUTANEOUS | Status: DC
  Administered 2017-01-29 – 2017-01-30 (×3): 3 [IU] via SUBCUTANEOUS
  Administered 2017-01-30: 2 [IU] via SUBCUTANEOUS
  Administered 2017-01-31: 3 [IU] via SUBCUTANEOUS
  Administered 2017-01-31: 2 [IU] via SUBCUTANEOUS
  Administered 2017-02-01: 3 [IU] via SUBCUTANEOUS
  Administered 2017-02-01 – 2017-02-02 (×4): 2 [IU] via SUBCUTANEOUS
  Administered 2017-02-03 – 2017-02-04 (×2): 1 [IU] via SUBCUTANEOUS

## 2017-01-29 MED ORDER — PRAVASTATIN SODIUM 40 MG PO TABS
40.0000 mg | ORAL_TABLET | Freq: Every day | ORAL | Status: DC
  Administered 2017-01-29 – 2017-02-03 (×5): 40 mg via ORAL
  Filled 2017-01-29 (×5): qty 1

## 2017-01-29 MED ORDER — LORATADINE 10 MG PO TABS
10.0000 mg | ORAL_TABLET | Freq: Every day | ORAL | Status: DC
  Administered 2017-01-30 – 2017-02-04 (×5): 10 mg via ORAL
  Filled 2017-01-29 (×5): qty 1

## 2017-01-29 MED ORDER — CLINDAMYCIN PHOSPHATE 600 MG/50ML IV SOLN
600.0000 mg | Freq: Four times a day (QID) | INTRAVENOUS | Status: DC
  Administered 2017-01-29 – 2017-02-04 (×23): 600 mg via INTRAVENOUS
  Filled 2017-01-29 (×23): qty 50

## 2017-01-29 NOTE — ED Provider Notes (Signed)
[facility] DEPT Provider Note   CSN: [id number] Arrival date & time: 01/29/17  1059     History   Chief Complaint Chief Complaint  Patient presents with  . Facial Swelling    HPI Sydney Morrow is a 56 y.o. female.  HPI Patient presents with right-sided facial pain and swelling. States she's had some sinus type pain in her mid face for last couple days. States that today she had more swelling of the lower side of her face. States it hurts backbiter irritable closer jaw. Swelling by the lower jaw. States she does not have many teeth in her mouth here. No difficulty swallowing. No fevers. Patient states she is not diabetic. Past Medical History:  Diagnosis Date  . Acid reflux   . Hypertension     Patient Active Problem List   Diagnosis Date Noted  . 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 CANDIDIASIS, ESOPHAGEAL 02/06/2010  . ANEMIA, IRON DEFICIENCY 02/06/2010  . PUD 02/06/2010    History reviewed. No pertinent surgical history.  OB History    No data available       Home Medications    Prior to Admission medications   Medication Sig Start Date End Date Taking? Authorizing Provider  loratadine (CLARITIN) 10 MG tablet Take 10 mg by mouth daily.   Yes [provider]  losartan (COZAAR) 25 MG tablet Take 12.5-25 mg by mouth daily.   Yes [provider]  lovastatin (MEVACOR) 20 MG tablet Take 20 mg by mouth at bedtime.   Yes [provider]  metoprolol tartrate (LOPRESSOR) 50 MG tablet Take 50 mg by mouth 2 (two) times daily.   Yes [provider]    Family History History reviewed. No pertinent family history.  Social History Social History  Substance Use Topics  . Smoking status: Current Every Day Smoker    Packs/day: 0.50  . Smokeless tobacco: Never Used  . Alcohol use No     Allergies   Aspirin and Penicillins   Review of Systems Review of Systems  Constitutional: Negative for appetite change and fever.    HENT: Positive for facial swelling and sinus pressure. Negative for dental problem.   Respiratory: Negative for chest tightness.   Cardiovascular: Negative for chest pain.  Gastrointestinal: Negative for abdominal pain.  Genitourinary: Negative for dysuria.  Musculoskeletal: Negative for back pain.  Neurological: Negative for light-headedness.  Hematological: Negative for adenopathy.  Psychiatric/Behavioral: Negative for confusion.     Physical Exam Updated Vital Signs BP (!) 189/95 (BP Location: Right Arm)   Pulse (!) 129   Temp 98.5 F (36.9 C) (Oral)   Resp 18   Ht 5\' 7"  (1.702 m)   Wt 83.5 kg (184 lb)   SpO2 97%   BMI 28.82 kg/m   Physical Exam  Constitutional: She appears well-developed.  HENT:  Right lower facial swelling. Appears to go up to the lower jaw. There is appears to be either previous extraction are broken off posterior tooth. No fluctuance. No swelling of floor of mouth. No fluctuance on upper jaw. No trismus  Eyes: EOM are normal.  Neck: Neck supple.  Cardiovascular:  Tachycardia  Pulmonary/Chest: Effort normal.  Abdominal: There is no tenderness.  Musculoskeletal: She exhibits no edema.  Neurological: She is alert.  Skin: Skin is warm. Capillary refill takes less than 2 seconds.  Psychiatric: She has a normal mood and affect.     ED Treatments / Results  Labs (all labs ordered are listed, but only abnormal  results are displayed) Labs Reviewed  BASIC METABOLIC PANEL - Abnormal; Notable for the following:       Result Value   Sodium 133 (*)    Chloride 98 (*)    Glucose, Bld 266 (*)    Creatinine, Ser 1.02 (*)    All other components within normal limits  CBC WITH DIFFERENTIAL/PLATELET - Abnormal; Notable for the following:    WBC 10.7 (*)    Neutro Abs 7.8 (*)    All other components within normal limits  I-STAT CG4 LACTIC ACID, ED    EKG  EKG Interpretation None       Radiology Ct Maxillofacial W Contrast  Result Date:  01/29/2017 CLINICAL DATA:  56 year old female with right facial swelling, difficulty swallowing this morning. No known injury. CONTRAST:  75 mL Isovue-300 EXAM: CT MAXILLOFACIAL WITH CONTRAST TECHNIQUE: Multidetector CT imaging of the maxillofacial structures was performed with IV contrast. Multiplanar CT image reconstructions were also generated. A small metallic BB was placed on the right temple in order to reliably differentiate right from left. COMPARISON:  None. FINDINGS: Osseous: Poor dentition. Dental caries and periapical lucency along the residual right mandible molar with what appears to be a small area of lateral cortical breakthrough (series 7, image 28 and series 3, image 57) with a small 3 x 9 mm adjacent periosteal abscess (series 2, images 58 and 59). Multiple maxillary dentition periapical lucencies, including a large 18 mm left incisor region maxilla cyst (series 2, image 53 and series 7, image 13). No superimposed acute facial fracture.  Visible calvarium intact. Orbits: Bilateral orbits soft tissues are within normal limits. Orbital walls are intact. Sinuses: Clear aside from mild mucosal thickening in the left maxillary alveolar recess. Bilateral tympanic cavities, mastoids, and petrous apex air cells are clear. Soft tissues: Negative visualized larynx, pharynx, parapharyngeal spaces, retropharyngeal space and sublingual space. Left submandibular and parotid spaces are normal. Along the posterior body of the right mandible there is asymmetric soft tissue thickening and stranding which continues superficially to the right subcutaneous soft tissues along the right jaw. Small periosteal abscess noted as detailed above. There is asymmetric thickening of the right platysma. There is moderate asymmetric enlargement of the soft tissues of the right masticator space, including the right masseter muscle which appears heterogeneous and edematous (series 2, image 55). There is some secondary involvement of  the right submandibular space and anterior right parotid space. There are reactive appearing right level Ib and right level IIa lymph nodes individually measuring up to 9 mm short axis. No cystic or necrotic nodes. Limited intracranial: Negative visualized brain parenchyma. Major vascular structures in the neck and at the skullbase are patent, including the right IJ, and the cavernous sinus. IMPRESSION: 1. Multi-spatial soft tissue infection of the right face appears to be odontogenic in origin. There is a very small periosteal abscess along the lateral aspect of the roots of the residual right mandible molar. 2. There is associated moderate to severe inflammation affecting the right masticator space, portions of the right submandibular and anterior parotid spaces, and superficial soft tissues of the right face. No other soft tissue abscess identified. 3. Reactive right cervical lymphadenopathy.  No suppurative nodes. 4. Diffuse poor dentition. Prominent 18 mm probably chronic postinflammatory cyst of the left maxilla alveolar process. Electronically Signed   By: Genevie Ann M.D.   On: 01/29/2017 13:15    Procedures Procedures (including critical care time)  Medications Ordered in ED Medications  sodium chloride 0.9 %  bolus 500 mL (500 mLs Intravenous New Bag/Given 01/29/17 1205)  iopamidol (ISOVUE-300) 61 % injection 100 mL (75 mLs Intravenous Contrast Given 01/29/17 1238)     Initial Impression / Assessment and Plan / ED Course  I have reviewed the triage vital signs and the nursing notes.  Pertinent labs & imaging results that were available during my care of the patient were reviewed by me and considered in my medical decision making (see chart for details).     Patient with facial swelling. Has cellulitis without frank abscess on CT scan. However white count is mildly elevated and new elevation of her glucose. No reported diabetes. However with the elevated sugar and the rather acute onset of the  infection and should benefit from admission. Admit to internal medicine. No abscess for draining right now.  Final Clinical Impressions(s) / ED Diagnoses   Final diagnoses:  Facial cellulitis    New Prescriptions New Prescriptions   No medications on file     Davonna Belling, MD 01/29/17 1333

## 2017-01-29 NOTE — ED Triage Notes (Signed)
Pt reports woke up with right jaw swelling and oral pain. Pt denies any recent injury. nad noted. Airway patent.

## 2017-01-29 NOTE — H&P (Signed)
History and Physical    Sydney Morrow RXV:[id number] DOB: [birth year] DOA: 01/29/2017  Referring MD/NP/PA: Debbra Riding PCP: [facility]  Patient coming from: Home  Chief Complaint: Pain and swelling of right side of face  HPI: Sydney Morrow is a 56 y.o. female with history of hypertension and hyperlipidemia who comes into the hospital with a 24-hour history of pain and edema to the right side of her face. She has very poor dentition and states she has not seen a dentist in many years, possibly since she was a child. She is afebrile, has a slight leukocytosis of 10.7, a maxillofacial CT shows a soft tissue infection of the right face that appears to be odontogenic in origin with a small periosteal abscess along the lateral aspect of the roots of the residual right mandible molar. Moderate to severe inflammation affecting the right masticator space, portions of the right submandibular and anterior parotid spaces, and superficial soft tissues of the right face, no other soft tissue abscess identified. There is also reactive right cervical lymphadenopathy. EDP has discussed with oral surgery on-call, Dr. Diona Browner, decision has been made to transfer to [facility] in [place] for continued IV antibiotic therapy as well as potential oral surgery. Admission requested.  Past Medical/Surgical History: Past Medical History:  Diagnosis Date  . Acid reflux   . Hypertension     History reviewed. No pertinent surgical history.  Social History:  reports that she has been smoking.  She has been smoking about 0.50 packs per day. She has never used smokeless tobacco. She reports that she does not drink alcohol or use drugs.  Allergies: Allergies  Allergen Reactions  . Aspirin   . Penicillins     unknown    Family History:  History of hypertension, no diabetes that she is aware of any family members.  Prior to Admission medications   Medication Sig  Start Date End Date Taking? Authorizing Provider  loratadine (CLARITIN) 10 MG tablet Take 10 mg by mouth daily.   Yes [provider]  losartan (COZAAR) 25 MG tablet Take 12.5-25 mg by mouth daily.   Yes [provider]  lovastatin (MEVACOR) 20 MG tablet Take 20 mg by mouth at bedtime.   Yes [provider]  metoprolol tartrate (LOPRESSOR) 50 MG tablet Take 50 mg by mouth 2 (two) times daily.   Yes [provider]    Review of Systems:  Constitutional: Denies fever, chills, diaphoresis, appetite change and fatigue.  HEENT: Denies photophobia, eye pain, redness, hearing loss, ear pain, congestion, sore throat, rhinorrhea, sneezing, mouth sores, trouble swallowing, neck pain, neck stiffness and tinnitus.   Respiratory: Denies SOB, DOE, cough, chest tightness,  and wheezing.   Cardiovascular: Denies chest pain, palpitations and leg swelling.  Gastrointestinal: Denies nausea, vomiting, abdominal pain, diarrhea, constipation, blood in stool and abdominal distention.  Genitourinary: Denies dysuria, urgency, frequency, hematuria, flank pain and difficulty urinating.  Endocrine: Denies: hot or cold intolerance, sweats, changes in hair or nails, polyuria, polydipsia. Musculoskeletal: Denies myalgias, back pain, joint swelling, arthralgias and gait problem.  Skin: Denies pallor, rash and wound.  Neurological: Denies dizziness, seizures, syncope, weakness, light-headedness, numbness and headaches.  Hematological: Denies adenopathy. Easy bruising, personal or family bleeding history  Psychiatric/Behavioral: Denies suicidal ideation, mood changes, confusion, nervousness, sleep disturbance and agitation    Physical Exam: Vitals:   01/29/17 1105  BP: (!) 189/95  Pulse: (!) 129  Resp: 18  Temp: 98.5 F (36.9 C)  TempSrc: Oral  SpO2: 97%  Weight: 83.5 kg (184 lb)  Height: 5\' 7"  (1.702 m)     Constitutional: NAD, calm, comfortable Eyes: PERRL, lids and  conjunctivae normal ENMT: Mucous membranes are moist. Very poor dentition, significant swelling of the right lower facial area around the mandible.  Neck: normal, supple, no masses, no thyromegaly Respiratory: clear to auscultation bilaterally, no wheezing, no crackles. Normal respiratory effort. No accessory muscle use.  Cardiovascular: Regular rate and rhythm, no murmurs / rubs / gallops. No extremity edema. 2+ pedal pulses. No carotid bruits.  Abdomen: no tenderness, no masses palpated. No hepatosplenomegaly. Bowel sounds positive.  Musculoskeletal: no clubbing / cyanosis. No joint deformity upper and lower extremities. Good ROM, no contractures. Normal muscle tone.  Skin: no rashes, lesions, ulcers. No induration Neurologic: CN 2-12 grossly intact. Sensation intact, DTR normal. Strength 5/5 in all 4.  Psychiatric: Normal judgment and insight. Alert and oriented x 3. Normal mood.    Labs on Admission: I have personally reviewed the following labs and imaging studies  CBC:  Recent Labs Lab 01/29/17 1141  WBC 10.7*  NEUTROABS 7.8*  HGB 14.6  HCT 42.1  MCV 90.5  PLT 017   Basic Metabolic Panel:  Recent Labs Lab 01/29/17 1141  NA 133*  K 4.1  CL 98*  CO2 22  GLUCOSE 266*  BUN 13  CREATININE 1.02*  CALCIUM 9.3   GFR: Estimated Creatinine Clearance: 68.4 mL/min (A) (by C-G formula based on SCr of 1.02 mg/dL (H)). Liver Function Tests: No results for input(s): AST, ALT, ALKPHOS, BILITOT, PROT, ALBUMIN in the last 168 hours. No results for input(s): LIPASE, AMYLASE in the last 168 hours. No results for input(s): AMMONIA in the last 168 hours. Coagulation Profile: No results for input(s): INR, PROTIME in the last 168 hours. Cardiac Enzymes: No results for input(s): CKTOTAL, CKMB, CKMBINDEX, TROPONINI in the last 168 hours. BNP (last 3 results) No results for input(s): PROBNP in the last 8760 hours. HbA1C: No results for input(s): HGBA1C in the last 72 hours. CBG: No  results for input(s): GLUCAP in the last 168 hours. Lipid Profile: No results for input(s): CHOL, HDL, LDLCALC, TRIG, CHOLHDL, LDLDIRECT in the last 72 hours. Thyroid Function Tests: No results for input(s): TSH, T4TOTAL, FREET4, T3FREE, THYROIDAB in the last 72 hours. Anemia Panel: No results for input(s): VITAMINB12, FOLATE, FERRITIN, TIBC, IRON, RETICCTPCT in the last 72 hours. Urine analysis: No results found for: COLORURINE, APPEARANCEUR, LABSPEC, PHURINE, GLUCOSEU, HGBUR, BILIRUBINUR, KETONESUR, PROTEINUR, UROBILINOGEN, NITRITE, LEUKOCYTESUR Sepsis Labs: @LABRCNTIP (procalcitonin:4,lacticidven:4) )No results found for this or any previous visit (from the past 240 hour(s)).   Radiological Exams on Admission: Ct Maxillofacial W Contrast  Result Date: 01/29/2017 CLINICAL DATA:  56 year old female with right facial swelling, difficulty swallowing this morning. No known injury. CONTRAST:  75 mL Isovue-300 EXAM: CT MAXILLOFACIAL WITH CONTRAST TECHNIQUE: Multidetector CT imaging of the maxillofacial structures was performed with IV contrast. Multiplanar CT image reconstructions were also generated. A small metallic BB was placed on the right temple in order to reliably differentiate right from left. COMPARISON:  None. FINDINGS: Osseous: Poor dentition. Dental caries and periapical lucency along the residual right mandible molar with what appears to be a small area of lateral cortical breakthrough (series 7, image 28 and series 3, image 57) with a small 3 x 9 mm adjacent periosteal abscess (series 2, images 58 and 59). Multiple maxillary dentition periapical lucencies, including a large 18 mm left incisor region maxilla cyst (series 2, image  53 and series 7, image 13). No superimposed acute facial fracture.  Visible calvarium intact. Orbits: Bilateral orbits soft tissues are within normal limits. Orbital walls are intact. Sinuses: Clear aside from mild mucosal thickening in the left maxillary alveolar  recess. Bilateral tympanic cavities, mastoids, and petrous apex air cells are clear. Soft tissues: Negative visualized larynx, pharynx, parapharyngeal spaces, retropharyngeal space and sublingual space. Left submandibular and parotid spaces are normal. Along the posterior body of the right mandible there is asymmetric soft tissue thickening and stranding which continues superficially to the right subcutaneous soft tissues along the right jaw. Small periosteal abscess noted as detailed above. There is asymmetric thickening of the right platysma. There is moderate asymmetric enlargement of the soft tissues of the right masticator space, including the right masseter muscle which appears heterogeneous and edematous (series 2, image 55). There is some secondary involvement of the right submandibular space and anterior right parotid space. There are reactive appearing right level Ib and right level IIa lymph nodes individually measuring up to 9 mm short axis. No cystic or necrotic nodes. Limited intracranial: Negative visualized brain parenchyma. Major vascular structures in the neck and at the skullbase are patent, including the right IJ, and the cavernous sinus. IMPRESSION: 1. Multi-spatial soft tissue infection of the right face appears to be odontogenic in origin. There is a very small periosteal abscess along the lateral aspect of the roots of the residual right mandible molar. 2. There is associated moderate to severe inflammation affecting the right masticator space, portions of the right submandibular and anterior parotid spaces, and superficial soft tissues of the right face. No other soft tissue abscess identified. 3. Reactive right cervical lymphadenopathy.  No suppurative nodes. 4. Diffuse poor dentition. Prominent 18 mm probably chronic postinflammatory cyst of the left maxilla alveolar process. Electronically Signed   By: Genevie Ann M.D.   On: 01/29/2017 13:15    EKG: Independently reviewed. None obtained in  ED  Assessment/Plan Principal Problem:   Odontogenic infection of jaw Active Problems:   Elevated glucose    Facial cellulitis -Due to odontogenic infection of the jaw -Agree with continued clindamycin. -Will transfer to [facility] for evaluation by oral surgery. Dr. Hoyt Koch has been notified.  Elevated glucose -No history of diabetes. -With a random glucose above 200 this qualifies her as being a diabetic. -Will check hemoglobin A1c, start a sensitive sliding scale. Would benefit from metformin upon discharge plus/minus other medications based on hemoglobin A1c levels. -Will ask diabetes coordinator and dietitian to see her.  Hypertension -Blood pressure elevated, suspect in part due to active infection and pain. -Plan for now to continue home medications: Metoprolol and Cozaar. Can consider medication changes or dose adjustments based on further blood pressure readings.  Hyperlipidemia -Continue statin   DVT prophylaxis: Lovenox  Code Status: Full code  Family Communication: Mother at bedside updated on plan of care and questions answered  Disposition Plan: Transfer to [place] called: Oral surgery, Dr. Hoyt Koch  Admission status: Inpatient    Time Spent: 80 minutes  Lelon Frohlich MD [facility] Pager [phone]  If 7PM-7AM, please contact night-coverage [email or web address] Password [facility]  01/29/2017, 2:36 PM

## 2017-01-30 ENCOUNTER — Encounter (HOSPITAL_COMMUNITY): Payer: Self-pay | Admitting: General Practice

## 2017-01-30 ENCOUNTER — Inpatient Hospital Stay (HOSPITAL_COMMUNITY): Payer: Self-pay

## 2017-01-30 DIAGNOSIS — M272 Inflammatory conditions of jaws: Secondary | ICD-10-CM

## 2017-01-30 HISTORY — DX: Inflammatory conditions of jaws: M27.2

## 2017-01-30 LAB — BASIC METABOLIC PANEL
Anion gap: 7 (ref 5–15)
BUN: 12 mg/dL (ref 6–20)
CHLORIDE: 104 mmol/L (ref 101–111)
CO2: 24 mmol/L (ref 22–32)
CREATININE: 1.04 mg/dL — AB (ref 0.44–1.00)
Calcium: 8.8 mg/dL — ABNORMAL LOW (ref 8.9–10.3)
GFR calc non Af Amer: 59 mL/min — ABNORMAL LOW (ref >60)
Glucose, Bld: 235 mg/dL — ABNORMAL HIGH (ref 65–99)
Potassium: 3.9 mmol/L (ref 3.5–5.1)
Sodium: 135 mmol/L (ref 135–145)

## 2017-01-30 LAB — CBC
HCT: 40.2 % (ref 36.0–46.0)
Hemoglobin: 13.3 g/dL (ref 12.0–15.0)
MCH: 30.6 pg (ref 26.0–34.0)
MCHC: 33.1 g/dL (ref 30.0–36.0)
MCV: 92.4 fL (ref 78.0–100.0)
PLATELETS: 246 10*3/uL (ref 150–400)
RBC: 4.35 MIL/uL (ref 3.87–5.11)
RDW: 12.7 % (ref 11.5–15.5)
WBC: 7.2 10*3/uL (ref 4.0–10.5)

## 2017-01-30 LAB — GLUCOSE, CAPILLARY
GLUCOSE-CAPILLARY: 213 mg/dL — AB (ref 65–99)
GLUCOSE-CAPILLARY: 213 mg/dL — AB (ref 65–99)
GLUCOSE-CAPILLARY: 168 mg/dL — AB (ref 65–99)
GLUCOSE-CAPILLARY: 215 mg/dL — AB (ref 65–99)

## 2017-01-30 LAB — HIV ANTIBODY (ROUTINE TESTING W REFLEX): HIV SCREEN 4TH GENERATION: NONREACTIVE

## 2017-01-30 LAB — HEMOGLOBIN A1C
Hgb A1c MFr Bld: 9.7 % — ABNORMAL HIGH (ref 4.8–5.6)
Mean Plasma Glucose: 232 mg/dL

## 2017-01-30 MED ORDER — LIVING WELL WITH DIABETES BOOK
Freq: Once | Status: AC
  Administered 2017-01-30: 14:00:00
  Filled 2017-01-30: qty 1

## 2017-01-30 MED ORDER — MORPHINE SULFATE (PF) 2 MG/ML IV SOLN
1.0000 mg | INTRAVENOUS | Status: DC | PRN
  Administered 2017-01-30 – 2017-02-01 (×9): 1 mg via INTRAVENOUS
  Filled 2017-01-30 (×10): qty 1

## 2017-01-30 MED ORDER — INSULIN GLARGINE 100 UNIT/ML ~~LOC~~ SOLN
12.0000 [IU] | Freq: Every day | SUBCUTANEOUS | Status: DC
  Administered 2017-01-30 – 2017-01-31 (×2): 12 [IU] via SUBCUTANEOUS
  Filled 2017-01-30 (×3): qty 0.12

## 2017-01-30 NOTE — Progress Notes (Signed)
[facility] Progress Note  Patient: Sydney Morrow IWP:[id number]   PCP: [facility] [place] DOB: [birth year]   DOA: 01/29/2017   DOS: 01/30/2017   Date of Service: the patient was seen and examined on 01/30/2017  Subjective: Feeling better but still has pain. Swelling is actually also getting better. No fever no chills. No dysphagia or odynophagia. No diarrhea no chest pain  Brief hospital course: Pt. with PMH of hypertension and dyslipidemia; admitted on 01/29/2017, presented with complaint of swelling of the jaw, was found to have cellulitis and abscess of the jaw as well as new onset diabetes. Currently further plan is continue clinical monitoring with IV antibiotics.  Assessment and Plan: 1. Cellulitis of the face. Mandible or abscess. Primary odontogenic, oral surgeon consulted. Appreciate input. At present they do not feel that the patient has a drainable abscess and recommended to continue IV antibiotics and clinical monitoring. Follow-up blood cultures.  2. New-onset type 2 diabetes mellitus. Patient mentions that she follows up with PCP on a regular basis and has not been told that she has diabetes. A1c 9.7. Will require insulin on discharge. Currently on sliding scale insulin as well as meal coverage. I would add scheduled long-acting Lantus at night. Diabetes coordinator also consulted. Nurse requested to educate the patient on checking CBG as well as on giving herself insulin.  3. Essential hypertension Blood pressure elevated. Continuing home medication metoprolol and Cozaar.  4. Dyslipidemia. Continue statin.  Diet: Carb modified diet DVT Prophylaxis: subcutaneous Heparin  Advance goals of care discussion: full code  Family Communication: family was present at bedside, at the time of interview. The pt provided permission to discuss medical plan with the family. Opportunity was given to ask question and all questions were answered satisfactorily.    Disposition:  Discharge to home.  Consultants: Oral surgery Procedures: none  Antibiotics: Anti-infectives    Start     Dose/Rate Route Frequency Ordered Stop   01/29/17 2000  clindamycin (CLEOCIN) IVPB 600 mg     600 mg 100 mL/hr over 30 Minutes Intravenous Every 6 hours 01/29/17 1758     01/29/17 1345  clindamycin (CLEOCIN) IVPB 600 mg     600 mg 100 mL/hr over 30 Minutes Intravenous  Once 01/29/17 1333 01/29/17 1349       Objective: Physical Exam: Vitals:   01/29/17 1819 01/29/17 2227 01/30/17 0805 01/30/17 1537  BP: (!) 180/82 (!) 158/74 (!) 154/75 (!) 128/51  Pulse: (!) 107 84 84 73  Resp: 17 18 18 18   Temp: 98.2 F (36.8 C) 99.4 F (37.4 C) 99.3 F (37.4 C) 99.1 F (37.3 C)  TempSrc:   Oral Oral  SpO2: 98% 99% 98% 100%  Weight: 84.1 kg (185 lb 6.4 oz)     Height: 5\' 7"  (1.702 m)       Intake/Output Summary (Last 24 hours) at 01/30/17 1846 Last data filed at 01/30/17 1806  Gross per 24 hour  Intake           2392.5 ml  Output              400 ml  Net           1992.5 ml   Filed Weights   01/29/17 1105 01/29/17 1819  Weight: 83.5 kg (184 lb) 84.1 kg (185 lb 6.4 oz)   General: Alert, Awake and Oriented to Time, Place and Person. Appear in mild distress, affect irritable Eyes: PERRL, Conjunctiva normal ENT: Oral Mucosa clear moist. Poor  dentition, mild edema of the right jaw Neck: no JVD, no Abnormal Mass Or lumps Cardiovascular: S1 and S2 Present, no Murmur, Peripheral Pulses Present Respiratory: normal respiratory effort, Bilateral Air entry equal and Decreased, no use of accessory muscle, Clear to Auscultation, no Crackles, no wheezes Abdomen: Bowel Sound present, Soft and no tenderness, no hernia Skin: no redness, no Rash, on induration Extremities: no Pedal edema, no calf tenderness Neurologic: Grossly no focal neuro deficit. Bilaterally Equal motor strength  Data Reviewed: CBC:  Recent Labs Lab 01/29/17 1141 01/29/17 1915 01/30/17 0834   WBC 10.7* 10.2 7.2  NEUTROABS 7.8*  --   --   HGB 14.6 14.7 13.3  HCT 42.1 42.7 40.2  MCV 90.5 91.2 92.4  PLT 281 294 741   Basic Metabolic Panel:  Recent Labs Lab 01/29/17 1141 01/29/17 1915 01/30/17 0834  NA 133*  --  135  K 4.1  --  3.9  CL 98*  --  104  CO2 22  --  24  GLUCOSE 266*  --  235*  BUN 13  --  12  CREATININE 1.02* 1.05* 1.04*  CALCIUM 9.3  --  8.8*    Liver Function Tests: No results for input(s): AST, ALT, ALKPHOS, BILITOT, PROT, ALBUMIN in the last 168 hours. No results for input(s): LIPASE, AMYLASE in the last 168 hours. No results for input(s): AMMONIA in the last 168 hours. Coagulation Profile: No results for input(s): INR, PROTIME in the last 168 hours. Cardiac Enzymes: No results for input(s): CKTOTAL, CKMB, CKMBINDEX, TROPONINI in the last 168 hours. BNP (last 3 results) No results for input(s): PROBNP in the last 8760 hours. CBG:  Recent Labs Lab 01/29/17 1826 01/29/17 2229 01/30/17 0804 01/30/17 1220 01/30/17 1723  GLUCAP 225* 293* 213* 213* 168*   Studies: Dg Orthopantogram  Result Date: 01/30/2017 CLINICAL DATA:  Cellulitis and abscess of oral soft tissues since yesterday, right worse than left, no injury. EXAM: ORTHOPANTOGRAM/PANORAMIC COMPARISON:  Maxillofacial CT 01/29/2017. FINDINGS: Midline artifact typical of Panorex technique. Multiple teeth are absent. As seen on CT, there is poor dentition with multiple dental caries and periapical lucencies. Largest lucency involves the left maxillary incisor. No evidence of acute fracture, dislocation or bone destruction. IMPRESSION: Stable radiographic appearance compared with recent CT. Poor dentition with multiple dental caries and periapical abscesses. Electronically Signed   By: Richardean Sale M.D.   On: 01/30/2017 16:54    Scheduled Meds: . enoxaparin (LOVENOX) injection  40 mg Subcutaneous Q24H  . insulin aspart  0-5 Units Subcutaneous QHS  . insulin aspart  0-9 Units Subcutaneous  TID WC  . insulin aspart  3 Units Subcutaneous TID WC  . loratadine  10 mg Oral Daily  . losartan  12.5 mg Oral Daily  . metoprolol tartrate  50 mg Oral BID  . pravastatin  40 mg Oral q1800   Continuous Infusions: . sodium chloride 75 mL/hr at 01/30/17 1020  . clindamycin (CLEOCIN) IV Stopped (01/30/17 1729)   PRN Meds: acetaminophen **OR** acetaminophen, hydrALAZINE, morphine injection, ondansetron **OR** ondansetron (ZOFRAN) IV, senna-docusate  Time spent: 35 minutes  Author: Berle Mull, MD [facility] Pager: [phone] 01/30/2017 6:46 PM  If 7PM-7AM, please contact night-coverage at [email or web address], password [facility]

## 2017-01-30 NOTE — Consult Note (Signed)
Reason for Consult:facial swelling Referring Physician: Hospitalist service  Sydney Morrow is an 56 y.o. female.  ZO:XWRUE facial swelling, cheek and lower jaw   HPI: Began having swelling 3 days ago. History of bad teeth. Admitted to [facility] and transferred to [facility]. Denies difficulty swallowing. Feeling somewhat better with lessening of swelling since on antibiotics.  Past Medical History:  Diagnosis Date  . Acid reflux   . History of anemia   . Hypertension   . Jaw inflammation, right 01/30/2017    Past Surgical History:  Procedure Laterality Date  . DENTAL SURGERY    . TUBAL LIGATION      History reviewed. No pertinent family history.  Social History:  reports that she has been smoking Cigarettes.  She has a 7.50 pack-year smoking history. She has never used smokeless tobacco. She reports that she does not drink alcohol or use drugs.  Allergies:  Allergies  Allergen Reactions  . Aspirin   . Penicillins     unknown    Medications: I have reviewed the patient's current medications.  Results for orders placed or performed during the hospital encounter of 01/29/17 (from the past 48 hour(s))  Basic metabolic panel     Status: Abnormal   Collection Time: 01/29/17 11:41 AM  Result Value Ref Range   Sodium 133 (L) 135 - 145 mmol/L   Potassium 4.1 3.5 - 5.1 mmol/L   Chloride 98 (L) 101 - 111 mmol/L   CO2 22 22 - 32 mmol/L   Glucose, Bld 266 (H) 65 - 99 mg/dL   BUN 13 6 - 20 mg/dL   Creatinine, Ser 1.02 (H) 0.44 - 1.00 mg/dL   Calcium 9.3 8.9 - 10.3 mg/dL   GFR calc non Af Amer >60 >60 mL/min   GFR calc Af Amer >60 >60 mL/min    Comment: (NOTE) The eGFR has been calculated using the CKD EPI equation. This calculation has not been validated in all clinical situations. eGFR's persistently <60 mL/min signify possible Chronic Kidney Disease.    Anion gap 13 5 - 15  CBC with Differential     Status: Abnormal   Collection Time: 01/29/17 11:41 AM  Result Value Ref  Range   WBC 10.7 (H) 4.0 - 10.5 K/uL   RBC 4.65 3.87 - 5.11 MIL/uL   Hemoglobin 14.6 12.0 - 15.0 g/dL   HCT 42.1 36.0 - 46.0 %   MCV 90.5 78.0 - 100.0 fL   MCH 31.4 26.0 - 34.0 pg   MCHC 34.7 30.0 - 36.0 g/dL   RDW 12.4 11.5 - 15.5 %   Platelets 281 150 - 400 K/uL   Neutrophils Relative % 73 %   Neutro Abs 7.8 (H) 1.7 - 7.7 K/uL   Lymphocytes Relative 21 %   Lymphs Abs 2.2 0.7 - 4.0 K/uL   Monocytes Relative 6 %   Monocytes Absolute 0.6 0.1 - 1.0 K/uL   Eosinophils Relative 0 %   Eosinophils Absolute 0.0 0.0 - 0.7 K/uL   Basophils Relative 0 %   Basophils Absolute 0.0 0.0 - 0.1 K/uL  I-Stat CG4 Lactic Acid, ED     Status: None   Collection Time: 01/29/17 12:01 PM  Result Value Ref Range   Lactic Acid, Venous 1.45 0.5 - 1.9 mmol/L  Glucose, capillary     Status: Abnormal   Collection Time: 01/29/17  6:26 PM  Result Value Ref Range   Glucose-Capillary 225 (H) 65 - 99 mg/dL  HIV antibody (Routine Testing)  Status: None   Collection Time: 01/29/17  7:15 PM  Result Value Ref Range   HIV Screen 4th Generation wRfx Non Reactive Non Reactive    Comment: (NOTE) Performed At: [facility] [place], [place] [id number] Lindon Romp MD CW:[id number]   CBC     Status: None   Collection Time: 01/29/17  7:15 PM  Result Value Ref Range   WBC 10.2 4.0 - 10.5 K/uL   RBC 4.68 3.87 - 5.11 MIL/uL   Hemoglobin 14.7 12.0 - 15.0 g/dL   HCT 42.7 36.0 - 46.0 %   MCV 91.2 78.0 - 100.0 fL   MCH 31.4 26.0 - 34.0 pg   MCHC 34.4 30.0 - 36.0 g/dL   RDW 12.5 11.5 - 15.5 %   Platelets 294 150 - 400 K/uL  Creatinine, serum     Status: Abnormal   Collection Time: 01/29/17  7:15 PM  Result Value Ref Range   Creatinine, Ser 1.05 (H) 0.44 - 1.00 mg/dL   GFR calc non Af Amer 58 (L) >60 mL/min   GFR calc Af Amer >60 >60 mL/min    Comment: (NOTE) The eGFR has been calculated using the CKD EPI equation. This calculation has not been validated in all clinical  situations. eGFR's persistently <60 mL/min signify possible Chronic Kidney Disease.   Hemoglobin A1c     Status: Abnormal   Collection Time: 01/29/17  7:15 PM  Result Value Ref Range   Hgb A1c MFr Bld 9.7 (H) 4.8 - 5.6 %    Comment: (NOTE)         Pre-diabetes: 5.7 - 6.4         Diabetes: >6.4         Glycemic control for adults with diabetes: <7.0    Mean Plasma Glucose 232 mg/dL    Comment: (NOTE) Performed At: [facility] [place], [place] [id number] Lindon Romp MD KJ:[id number]   Glucose, capillary     Status: Abnormal   Collection Time: 01/29/17 10:29 PM  Result Value Ref Range   Glucose-Capillary 293 (H) 65 - 99 mg/dL  Glucose, capillary     Status: Abnormal   Collection Time: 01/30/17  8:04 AM  Result Value Ref Range   Glucose-Capillary 213 (H) 65 - 99 mg/dL  Basic metabolic panel     Status: Abnormal   Collection Time: 01/30/17  8:34 AM  Result Value Ref Range   Sodium 135 135 - 145 mmol/L   Potassium 3.9 3.5 - 5.1 mmol/L   Chloride 104 101 - 111 mmol/L   CO2 24 22 - 32 mmol/L   Glucose, Bld 235 (H) 65 - 99 mg/dL   BUN 12 6 - 20 mg/dL   Creatinine, Ser 1.04 (H) 0.44 - 1.00 mg/dL   Calcium 8.8 (L) 8.9 - 10.3 mg/dL   GFR calc non Af Amer 59 (L) >60 mL/min   GFR calc Af Amer >60 >60 mL/min    Comment: (NOTE) The eGFR has been calculated using the CKD EPI equation. This calculation has not been validated in all clinical situations. eGFR's persistently <60 mL/min signify possible Chronic Kidney Disease.    Anion gap 7 5 - 15  CBC     Status: None   Collection Time: 01/30/17  8:34 AM  Result Value Ref Range   WBC 7.2 4.0 - 10.5 K/uL   RBC 4.35 3.87 - 5.11 MIL/uL   Hemoglobin 13.3 12.0 - 15.0 g/dL   HCT 40.2  36.0 - 46.0 %   MCV 92.4 78.0 - 100.0 fL   MCH 30.6 26.0 - 34.0 pg   MCHC 33.1 30.0 - 36.0 g/dL   RDW 12.7 11.5 - 15.5 %   Platelets 246 150 - 400 K/uL  Glucose, capillary     Status: Abnormal   Collection Time: 01/30/17  12:20 PM  Result Value Ref Range   Glucose-Capillary 213 (H) 65 - 99 mg/dL    Ct Maxillofacial W Contrast  Result Date: 01/29/2017 CLINICAL DATA:  56 year old female with right facial swelling, difficulty swallowing this morning. No known injury. CONTRAST:  75 mL Isovue-300 EXAM: CT MAXILLOFACIAL WITH CONTRAST TECHNIQUE: Multidetector CT imaging of the maxillofacial structures was performed with IV contrast. Multiplanar CT image reconstructions were also generated. A small metallic BB was placed on the right temple in order to reliably differentiate right from left. COMPARISON:  None. FINDINGS: Osseous: Poor dentition. Dental caries and periapical lucency along the residual right mandible molar with what appears to be a small area of lateral cortical breakthrough (series 7, image 28 and series 3, image 57) with a small 3 x 9 mm adjacent periosteal abscess (series 2, images 58 and 59). Multiple maxillary dentition periapical lucencies, including a large 18 mm left incisor region maxilla cyst (series 2, image 53 and series 7, image 13). No superimposed acute facial fracture.  Visible calvarium intact. Orbits: Bilateral orbits soft tissues are within normal limits. Orbital walls are intact. Sinuses: Clear aside from mild mucosal thickening in the left maxillary alveolar recess. Bilateral tympanic cavities, mastoids, and petrous apex air cells are clear. Soft tissues: Negative visualized larynx, pharynx, parapharyngeal spaces, retropharyngeal space and sublingual space. Left submandibular and parotid spaces are normal. Along the posterior body of the right mandible there is asymmetric soft tissue thickening and stranding which continues superficially to the right subcutaneous soft tissues along the right jaw. Small periosteal abscess noted as detailed above. There is asymmetric thickening of the right platysma. There is moderate asymmetric enlargement of the soft tissues of the right masticator space, including  the right masseter muscle which appears heterogeneous and edematous (series 2, image 55). There is some secondary involvement of the right submandibular space and anterior right parotid space. There are reactive appearing right level Ib and right level IIa lymph nodes individually measuring up to 9 mm short axis. No cystic or necrotic nodes. Limited intracranial: Negative visualized brain parenchyma. Major vascular structures in the neck and at the skullbase are patent, including the right IJ, and the cavernous sinus. IMPRESSION: 1. Multi-spatial soft tissue infection of the right face appears to be odontogenic in origin. There is a very small periosteal abscess along the lateral aspect of the roots of the residual right mandible molar. 2. There is associated moderate to severe inflammation affecting the right masticator space, portions of the right submandibular and anterior parotid spaces, and superficial soft tissues of the right face. No other soft tissue abscess identified. 3. Reactive right cervical lymphadenopathy.  No suppurative nodes. 4. Diffuse poor dentition. Prominent 18 mm probably chronic postinflammatory cyst of the left maxilla alveolar process. Electronically Signed   By: Genevie Ann M.D.   On: 01/29/2017 13:15    ROS Blood pressure (!) 128/51, pulse 73, temperature 99.1 F (37.3 C), temperature source Oral, resp. rate 18, height _0  (1.702 m), weight 185 lb 6.4 oz (84.1 kg), SpO2 100 %. General appearance: alert, cooperative and no distress Head: Normocephalic, without obvious abnormality, atraumatic, mild edema right cheek.  non-fluctuant. Eyes: negative Nose: Nares normal. Septum midline. Mucosa normal. No drainage or sinus tenderness. Throat: multiple decayed teeth. No fluctuant absesses. No purulent exudate. Pharynx clear. no trismus Neck: supple, symmetrical, trachea midline and mild lymphadenopathy right cervical  Assessment/Plan: 57 yo F HTN, Hyperlipidemia with multiple dental  caries and abscessed teeth. No drainable space infection. Will obtain panorex radiograph. If swelling continues to diminish, would prefer to treat as outpatient in office setting.   Navya Timmons M 01/30/2017, 3:58 PM

## 2017-01-30 NOTE — Progress Notes (Signed)
Inpatient Diabetes Program Recommendations  AACE/ADA: New Consensus Statement on Inpatient Glycemic Control (2015)  Target Ranges:  Prepandial:   less than 140 mg/dL      Peak postprandial:   less than 180 mg/dL (1-2 hours)      Critically ill patients:  140 - 180 mg/dL   Results for KITRINA, MAURIN (MRN [id number]) as of 01/30/2017 15:49  Ref. Range 01/29/2017 18:26 01/29/2017 22:29 01/30/2017 08:04 01/30/2017 12:20  Glucose-Capillary Latest Ref Range: 65 - 99 mg/dL 225 (H) 293 (H) 213 (H) 213 (H)   Results for VALEREE, LEIDY (MRN [id number]) as of 01/30/2017 15:49  Ref. Range 01/29/2017 19:15  Hemoglobin A1C Latest Ref Range: 4.8 - 5.6 % 9.7 (H)    Admit with: Odontogenic infection of the jaw  No Prior History of DM.  New diagnosis of DM this admission.  Current Insulin Orders: Novolog Sensitive Correction Scale/ SSI (0-9 units) TID AC + HS      Novolog 3 units TID      MD- If pt continues to have elevated fasting glucose levels, please consider starting basal insulin for in-hospital:  Recommend Lantus 12 units QHS (0.15 units/kg dosing)  For discharge, pt may be able to try oral DM medications to start.  Could try Metformin + Amaryl with follow up with the [facility].     Spoke with pt about new diagnosis.  Discussed A1C results with her and explained what an A1C is, basic pathophysiology of DM Type 2, basic home care, basic diabetes diet nutrition principles, importance of checking CBGs and maintaining good CBG control to prevent long-term and short-term complications.  Reviewed signs and symptoms of hyperglycemia and hypoglycemia and how to treat hypoglycemia at home.  Also reviewed blood sugar goals and A1c goals for home.    RNs to provide ongoing basic DM education at bedside with this patient.  Have ordered educational booklet and DM videos.  RD consult also placed for DM diet education for this patient.  Patient told me both her mother and her father  both had diabetes.  Has checked her CBGs before and was told she was "pre-diabetic".    Discussed with patient that the MD will likely send her home on some sort of medication regimen to help control CBGs.  [facility] for care.  Gave patient information on purchasing an inexpensive CBG meter and strips at [employer] OTC without a Rx.  Meter is $9 and a box of 50 strips is $9.  Also gave pt information on free DM classes offered at [facility].      --Will follow patient during hospitalization--  Wyn Quaker RN, MSN, CDE Diabetes Coordinator Inpatient Glycemic Control Team Team Pager: [phone] (8a-5p)

## 2017-01-31 LAB — BASIC METABOLIC PANEL
Anion gap: 8 (ref 5–15)
BUN: 12 mg/dL (ref 6–20)
CALCIUM: 8.5 mg/dL — AB (ref 8.9–10.3)
CO2: 22 mmol/L (ref 22–32)
Chloride: 108 mmol/L (ref 101–111)
Creatinine, Ser: 0.93 mg/dL (ref 0.44–1.00)
GFR calc Af Amer: >60 mL/min (ref >60)
Glucose, Bld: 190 mg/dL — ABNORMAL HIGH (ref 65–99)
POTASSIUM: 4 mmol/L (ref 3.5–5.1)
SODIUM: 138 mmol/L (ref 135–145)

## 2017-01-31 LAB — CBC
HCT: 37.5 % (ref 36.0–46.0)
Hemoglobin: 12.1 g/dL (ref 12.0–15.0)
MCH: 30.5 pg (ref 26.0–34.0)
MCHC: 32.3 g/dL (ref 30.0–36.0)
MCV: 94.5 fL (ref 78.0–100.0)
PLATELETS: 200 10*3/uL (ref 150–400)
RBC: 3.97 MIL/uL (ref 3.87–5.11)
RDW: 13.1 % (ref 11.5–15.5)
WBC: 6.4 10*3/uL (ref 4.0–10.5)

## 2017-01-31 LAB — GLUCOSE, CAPILLARY
GLUCOSE-CAPILLARY: 198 mg/dL — AB (ref 65–99)
Glucose-Capillary: 183 mg/dL — ABNORMAL HIGH (ref 65–99)
Glucose-Capillary: 212 mg/dL — ABNORMAL HIGH (ref 65–99)
Glucose-Capillary: 181 mg/dL — ABNORMAL HIGH (ref 65–99)

## 2017-01-31 LAB — MAGNESIUM: MAGNESIUM: 1.8 mg/dL (ref 1.7–2.4)

## 2017-01-31 MED ORDER — FAMOTIDINE 20 MG PO TABS
20.0000 mg | ORAL_TABLET | Freq: Every day | ORAL | Status: DC
  Administered 2017-01-31 – 2017-02-04 (×4): 20 mg via ORAL
  Filled 2017-01-31 (×4): qty 1

## 2017-01-31 NOTE — Progress Notes (Addendum)
[facility] Progress Note  Patient: Sydney Morrow   PCP: [facility] [place] DOB: [birth year]   DOA: 01/29/2017             DOS: 01/31/2017   Date of Service: the patient was seen and examined on 01/31/2017  Subjective: Pain is about the same, swelling she feels is actually about the same. No difficulty swallowing. No diarrhea.  Brief hospital course: Pt. with PMH of hypertension and dyslipidemia; admitted on 01/29/2017, presented with complaint of swelling of the jaw, was found to have cellulitis and abscess of the jaw as well as new onset diabetes. Currently further plan is continue clinical monitoring with IV antibiotics.  Assessment and Plan: 1. Cellulitis of the face. Mandible or abscess. Primary odontogenic, oral surgeon consulted. Appreciate input. At present they do not feel that the patient has a drainable abscess and recommended to continue IV antibiotics and clinical monitoring. Unfortunately no blood cultures. Continue IV antibiotics for tonight transition to by mouth tomorrow.  2. New-onset type 2 diabetes mellitus. Patient mentions that she follows up with PCP on a regular basis and has not been told that she has diabetes. A1c 9.7. Will require insulin on discharge. Currently on sliding scale insulin as well as meal coverage. I would add scheduled long-acting Lantus at night. Diabetes coordinator also consulted. Nurse requested to educate the patient on checking CBG as well as on giving herself insulin.  3. Essential hypertension Blood pressure elevated. Continuing home medication metoprolol and Cozaar.  4. Dyslipidemia. Continue statin.  Diet: Carb modified diet DVT Prophylaxis: subcutaneous Heparin  Advance goals of care discussion: full code  Family Communication: no family was present at bedside, at the time of interview.    Disposition:  Discharge to home. Likely tomorrow  Consultants: Oral surgery Procedures:  none  Antibiotics:           Anti-infectives    Start     Dose/Rate Route Frequency Ordered Stop   01/29/17 2000  clindamycin (CLEOCIN) IVPB 600 mg     600 mg 100 mL/hr over 30 Minutes Intravenous Every 6 hours 01/29/17 1758     01/29/17 1345  clindamycin (CLEOCIN) IVPB 600 mg     600 mg 100 mL/hr over 30 Minutes Intravenous  Once 01/29/17 1333 01/29/17 1349       Objective: Physical Exam:       Vitals:   01/31/17 0502 01/31/17 0558 01/31/17 1254 01/31/17 1446  BP: (!) 175/79 (!) 149/57 (!) 144/66 (!) 168/84  Pulse: 65 66 66 65  Resp: 18   16  Temp: 98.1 F (36.7 C)   98.7 F (37.1 C)  TempSrc:      SpO2: 100%   100%  Weight:      Height:        Intake/Output Summary (Last 24 hours) at 01/31/17 1717 Last data filed at 01/31/17 1444  Gross per 24 hour  Intake             1360 ml  Output             1900 ml  Net             -540 ml       Filed Weights   01/29/17 1105 01/29/17 1819  Weight: 83.5 kg (184 lb) 84.1 kg (185 lb 6.4 oz)   General: Alert, Awake and Oriented to Time, Place and Person. Appear in mild distress, affect irritable Eyes: PERRL, Conjunctiva normal ENT: Oral Mucosa  clear moist. Poor dentition, mild edema of the right jaw Neck: no JVD, no Abnormal Mass Or lumps Cardiovascular: S1 and S2 Present, no Murmur, Peripheral Pulses Present Respiratory: normal respiratory effort, Bilateral Air entry equal and Decreased, no use of accessory muscle, Clear to Auscultation, no Crackles, no wheezes Abdomen: Bowel Sound present, Soft and no tenderness, no hernia Skin: no redness, no Rash, on induration Extremities: no Pedal edema, no calf tenderness Neurologic: Grossly no focal neuro deficit. Bilaterally Equal motor strength  Data Reviewed: CBC:  Last Labs    Recent Labs Lab 01/29/17 1141 01/29/17 1915 01/30/17 0834 01/31/17 0552  WBC 10.7* 10.2 7.2 6.4  NEUTROABS 7.8*  --   --   --   HGB 14.6 14.7 13.3 12.1  HCT  42.1 42.7 40.2 37.5  MCV 90.5 91.2 92.4 94.5  PLT 281 294 246 200     Basic Metabolic Panel:  Last Labs    Recent Labs Lab 01/29/17 1141 01/29/17 1915 01/30/17 0834 01/31/17 0552  NA 133*  --  135 138  K 4.1  --  3.9 4.0  CL 98*  --  104 108  CO2 22  --  24 22  GLUCOSE 266*  --  235* 190*  BUN 13  --  12 12  CREATININE 1.02* 1.05* 1.04* 0.93  CALCIUM 9.3  --  8.8* 8.5*  MG  --   --   --  1.8     CBG:  Last Labs    Recent Labs Lab 01/30/17 1220 01/30/17 1723 01/30/17 2008 01/31/17 0800 01/31/17 1202  GLUCAP 213* 168* 215* 183* 212*     Studies: No results found.  Scheduled Meds: . enoxaparin (LOVENOX) injection  40 mg Subcutaneous Q24H  . famotidine  20 mg Oral Daily  . insulin aspart  0-5 Units Subcutaneous QHS  . insulin aspart  0-9 Units Subcutaneous TID WC  . insulin aspart  3 Units Subcutaneous TID WC  . insulin glargine  12 Units Subcutaneous QHS  . loratadine  10 mg Oral Daily  . losartan  12.5 mg Oral Daily  . metoprolol tartrate  50 mg Oral BID  . pravastatin  40 mg Oral q1800   Continuous Infusions: . sodium chloride 75 mL/hr at 01/31/17 0038  . clindamycin (CLEOCIN) IV Stopped (01/31/17 1502)   PRN Meds: acetaminophen **OR** acetaminophen, hydrALAZINE, morphine injection, ondansetron **OR** ondansetron (ZOFRAN) IV, senna-docusate  Time spent: 30 minutes  Author: Berle Mull, MD [facility] Pager: [phone] 01/31/2017 5:17 PM  If 7PM-7AM, please contact night-coverage at [email or web address], password [facility]

## 2017-01-31 NOTE — Progress Notes (Signed)
Brief Nutrition Education Note  Lab Results  Component Value Date   HGBA1C 9.7 (H) 01/29/2017   RD consulted for diet education for DM. Pt with new onset DM, but was diagnosed with pre-diabetes in the past.   Attempted to see pt x 2, however, pt either unavailable or in with nursing at time of visit.   RD will attempt to follow-up to address further education needs. Noted pt was seen by DM coordinator on 01/30/17 and 01/31/17; pt was also given information on free outpatient DM classes in [facility] area where pt resides.   Erica Osuna A. Jimmye Norman, RD, LDN, CDE Pager: [phone] After hours Pager: [phone]

## 2017-01-31 NOTE — Progress Notes (Addendum)
Inpatient Diabetes Program Recommendations  AACE/ADA: New Consensus Statement on Inpatient Glycemic Control (2015)  Target Ranges:  Prepandial:   less than 140 mg/dL      Peak postprandial:   less than 180 mg/dL (1-2 hours)      Critically ill patients:  140 - 180 mg/dL   Lab Results  Component Value Date   GLUCAP 183 (H) 01/31/2017   HGBA1C 9.7 (H) 01/29/2017    Review of Glycemic Control  Results for Sydney Morrow, Sydney Morrow (MRN [id number]) as of 01/31/2017 09:37  Ref. Range 01/30/2017 08:04 01/30/2017 12:20 01/30/2017 17:23 01/30/2017 20:08 01/31/2017 08:00  Glucose-Capillary Latest Ref Range: 65 - 99 mg/dL 213 (H) 213 (H) 168 (H) 215 (H) 183 (H)    Diabetes history: Type 2- new diagnosis Outpatient Diabetes medications: none  Current orders for Inpatient glycemic control: Novolog Sensitive Correction Scale/ SSI (0-9 units) TID AC , Novolog 0-5 units qhs, Novolog 3 units TID, Lantus 12 units qhs  Inpatient Diabetes Program Recommendations:   Consider increasing Novolog correction to moderate correction scale 0-15 units tid.   Consider increasing Lantus insulin to 14 units qhs  Please change diet to heart healthy/carb modified  Met with patient at the bedside- she is feeling very poorly and denies questions regarding diabetes stating" both my mother and father" had diabetes.  When I attempted to discuss A1C- she stated "she told me yesterday".  She was able to tell me where to purchase a meter as instructed by RN yesterday.  When the patient is feeling better and if she has questions, please do not hesitate to place a referral so we can speak to her.   Gentry Fitz, RN, BA, MHA, CDE Diabetes Coordinator Inpatient Diabetes Program  [phone] (Team Pager) [phone] ([place]) 01/31/2017 9:43 AM

## 2017-02-01 ENCOUNTER — Inpatient Hospital Stay (HOSPITAL_COMMUNITY): Payer: Self-pay | Admitting: Anesthesiology

## 2017-02-01 ENCOUNTER — Encounter (HOSPITAL_COMMUNITY)
Admission: EM | Disposition: A | Discharge status: Home / Self Care | Payer: Self-pay | Source: Home / Self Care | Attending: Internal Medicine

## 2017-02-01 HISTORY — PX: TOOTH EXTRACTION: SHX859

## 2017-02-01 LAB — CBC
HCT: 34.9 % — ABNORMAL LOW (ref 36.0–46.0)
Hemoglobin: 11.3 g/dL — ABNORMAL LOW (ref 12.0–15.0)
MCH: 30.1 pg (ref 26.0–34.0)
MCHC: 32.4 g/dL (ref 30.0–36.0)
MCV: 92.8 fL (ref 78.0–100.0)
PLATELETS: 247 10*3/uL (ref 150–400)
RBC: 3.76 MIL/uL — ABNORMAL LOW (ref 3.87–5.11)
RDW: 12.7 % (ref 11.5–15.5)
WBC: 5.7 10*3/uL (ref 4.0–10.5)

## 2017-02-01 LAB — BASIC METABOLIC PANEL
Anion gap: 6 (ref 5–15)
BUN: 9 mg/dL (ref 6–20)
CALCIUM: 8.7 mg/dL — AB (ref 8.9–10.3)
CO2: 22 mmol/L (ref 22–32)
CREATININE: 1.04 mg/dL — AB (ref 0.44–1.00)
Chloride: 110 mmol/L (ref 101–111)
GFR calc Af Amer: >60 mL/min (ref >60)
GFR calc non Af Amer: 59 mL/min — ABNORMAL LOW (ref >60)
GLUCOSE: 177 mg/dL — AB (ref 65–99)
Potassium: 4.1 mmol/L (ref 3.5–5.1)
Sodium: 138 mmol/L (ref 135–145)

## 2017-02-01 LAB — GLUCOSE, CAPILLARY
GLUCOSE-CAPILLARY: 120 mg/dL — AB (ref 65–99)
Glucose-Capillary: 155 mg/dL — ABNORMAL HIGH (ref 65–99)
Glucose-Capillary: 142 mg/dL — ABNORMAL HIGH (ref 65–99)
Glucose-Capillary: 207 mg/dL — ABNORMAL HIGH (ref 65–99)
Glucose-Capillary: 164 mg/dL — ABNORMAL HIGH (ref 65–99)
Glucose-Capillary: 199 mg/dL — ABNORMAL HIGH (ref 65–99)

## 2017-02-01 LAB — SURGICAL PCR SCREEN
MRSA, PCR: NEGATIVE
STAPHYLOCOCCUS AUREUS: NEGATIVE

## 2017-02-01 SURGERY — DENTAL RESTORATION/EXTRACTIONS
Anesthesia: General | Site: Mouth

## 2017-02-01 MED ORDER — 0.9 % SODIUM CHLORIDE (POUR BTL) OPTIME
TOPICAL | Status: DC | PRN
  Administered 2017-02-01: 1000 mL

## 2017-02-01 MED ORDER — OXYMETAZOLINE HCL 0.05 % NA SOLN
NASAL | Status: AC
  Filled 2017-02-01: qty 15

## 2017-02-01 MED ORDER — LABETALOL HCL 5 MG/ML IV SOLN
INTRAVENOUS | Status: AC
  Filled 2017-02-01: qty 4

## 2017-02-01 MED ORDER — METOPROLOL TARTRATE 5 MG/5ML IV SOLN
5.0000 mg | Freq: Three times a day (TID) | INTRAVENOUS | Status: DC
  Administered 2017-02-01 – 2017-02-02 (×4): 5 mg via INTRAVENOUS
  Filled 2017-02-01 (×4): qty 5

## 2017-02-01 MED ORDER — INSULIN GLARGINE 100 UNIT/ML ~~LOC~~ SOLN
6.0000 [IU] | Freq: Every day | SUBCUTANEOUS | Status: DC
  Administered 2017-02-01: 6 [IU] via SUBCUTANEOUS
  Filled 2017-02-01 (×2): qty 0.06

## 2017-02-01 MED ORDER — OXYCODONE HCL 5 MG/5ML PO SOLN: 5.0000 mg | Freq: Once | ORAL | Status: DC | PRN

## 2017-02-01 MED ORDER — MIDAZOLAM HCL 5 MG/5ML IJ SOLN
INTRAMUSCULAR | Status: DC | PRN
  Administered 2017-02-01: 2 mg via INTRAVENOUS

## 2017-02-01 MED ORDER — OXYCODONE-ACETAMINOPHEN 5-325 MG PO TABS
1.0000 | ORAL_TABLET | ORAL | Status: DC | PRN
  Administered 2017-02-02 – 2017-02-04 (×8): 2 via ORAL
  Filled 2017-02-01 (×8): qty 2

## 2017-02-01 MED ORDER — LIDOCAINE-EPINEPHRINE 2 %-1:100000 IJ SOLN
INTRAMUSCULAR | Status: AC
  Filled 2017-02-01: qty 1

## 2017-02-01 MED ORDER — LABETALOL HCL 5 MG/ML IV SOLN
5.0000 mg | Freq: Once | INTRAVENOUS | Status: AC
  Administered 2017-02-01: 5 mg via INTRAVENOUS

## 2017-02-01 MED ORDER — LIDOCAINE HCL (CARDIAC) 20 MG/ML IV SOLN
INTRAVENOUS | Status: DC | PRN
  Administered 2017-02-01: 60 mg via INTRAVENOUS

## 2017-02-01 MED ORDER — LIDOCAINE-EPINEPHRINE 2 %-1:100000 IJ SOLN
INTRAMUSCULAR | Status: DC | PRN
  Administered 2017-02-01: 20 mL

## 2017-02-01 MED ORDER — KETOROLAC TROMETHAMINE 30 MG/ML IJ SOLN: 30.0000 mg | Freq: Four times a day (QID) | INTRAMUSCULAR | Status: DC | PRN

## 2017-02-01 MED ORDER — OXYCODONE HCL 5 MG PO TABS: 5.0000 mg | ORAL_TABLET | Freq: Once | ORAL | Status: DC | PRN

## 2017-02-01 MED ORDER — FENTANYL CITRATE (PF) 250 MCG/5ML IJ SOLN
INTRAMUSCULAR | Status: AC
  Filled 2017-02-01: qty 5

## 2017-02-01 MED ORDER — HYDROMORPHONE HCL 1 MG/ML IJ SOLN
0.2500 mg | INTRAMUSCULAR | Status: DC | PRN
  Administered 2017-02-01: 0.5 mg via INTRAVENOUS

## 2017-02-01 MED ORDER — STARCH (THICKENING) PO POWD: ORAL | Status: DC | PRN

## 2017-02-01 MED ORDER — MORPHINE SULFATE (PF) 2 MG/ML IV SOLN: 2.0000 mg | INTRAVENOUS | Status: DC | PRN

## 2017-02-01 MED ORDER — FENTANYL CITRATE (PF) 100 MCG/2ML IJ SOLN
INTRAMUSCULAR | Status: DC | PRN
  Administered 2017-02-01: 200 ug via INTRAVENOUS
  Administered 2017-02-01: 50 ug via INTRAVENOUS

## 2017-02-01 MED ORDER — MORPHINE SULFATE (PF) 2 MG/ML IV SOLN
1.0000 mg | INTRAVENOUS | Status: DC | PRN
Start: 2017-02-01 — End: 2017-02-04
  Administered 2017-02-01 – 2017-02-02 (×4): 1 mg via INTRAVENOUS
  Filled 2017-02-01 (×4): qty 1

## 2017-02-01 MED ORDER — SUGAMMADEX SODIUM 200 MG/2ML IV SOLN
INTRAVENOUS | Status: DC | PRN
  Administered 2017-02-01: 150 mg via INTRAVENOUS

## 2017-02-01 MED ORDER — LACTATED RINGERS IV SOLN
INTRAVENOUS | Status: DC
  Administered 2017-02-01 (×2): via INTRAVENOUS

## 2017-02-01 MED ORDER — NITROGLYCERIN 0.4 MG/HR TD PT24
0.4000 mg | MEDICATED_PATCH | Freq: Every day | TRANSDERMAL | Status: DC
  Administered 2017-02-01 – 2017-02-02 (×2): 0.4 mg via TRANSDERMAL
  Filled 2017-02-01 (×2): qty 1

## 2017-02-01 MED ORDER — PROPOFOL 10 MG/ML IV BOLUS
INTRAVENOUS | Status: AC
  Filled 2017-02-01: qty 20

## 2017-02-01 MED ORDER — PROMETHAZINE HCL 25 MG/ML IJ SOLN: 6.2500 mg | INTRAMUSCULAR | Status: DC | PRN

## 2017-02-01 MED ORDER — RESOURCE THICKENUP CLEAR PO POWD
ORAL | Status: DC | PRN
  Filled 2017-02-01: qty 125

## 2017-02-01 MED ORDER — PROPOFOL 10 MG/ML IV BOLUS
INTRAVENOUS | Status: DC | PRN
  Administered 2017-02-01: 200 mg via INTRAVENOUS

## 2017-02-01 MED ORDER — ONDANSETRON HCL 4 MG/2ML IJ SOLN
INTRAMUSCULAR | Status: DC | PRN
  Administered 2017-02-01: 4 mg via INTRAVENOUS

## 2017-02-01 MED ORDER — ROCURONIUM BROMIDE 100 MG/10ML IV SOLN
INTRAVENOUS | Status: DC | PRN
  Administered 2017-02-01: 20 mg via INTRAVENOUS
  Administered 2017-02-01: 10 mg via INTRAVENOUS

## 2017-02-01 MED ORDER — LOSARTAN POTASSIUM 50 MG PO TABS
50.0000 mg | ORAL_TABLET | Freq: Every day | ORAL | Status: DC
  Administered 2017-02-02: 50 mg via ORAL
  Filled 2017-02-01: qty 1

## 2017-02-01 MED ORDER — SODIUM CHLORIDE 0.9 % IR SOLN
Status: DC | PRN
  Administered 2017-02-01: 1000 mL

## 2017-02-01 MED ORDER — PHENYLEPHRINE HCL 10 MG/ML IJ SOLN
INTRAMUSCULAR | Status: DC | PRN
  Administered 2017-02-01: 80 ug via INTRAVENOUS

## 2017-02-01 MED ORDER — OXYMETAZOLINE HCL 0.05 % NA SOLN
NASAL | Status: DC | PRN
  Administered 2017-02-01: 1

## 2017-02-01 MED ORDER — HEMOSTATIC AGENTS (NO CHARGE) OPTIME
TOPICAL | Status: DC | PRN
  Administered 2017-02-01: 1 via TOPICAL

## 2017-02-01 MED ORDER — MIDAZOLAM HCL 2 MG/2ML IJ SOLN
INTRAMUSCULAR | Status: AC
  Filled 2017-02-01: qty 2

## 2017-02-01 MED ORDER — HYDROMORPHONE HCL 1 MG/ML IJ SOLN
INTRAMUSCULAR | Status: AC
  Filled 2017-02-01: qty 0.5

## 2017-02-01 MED ORDER — SUGAMMADEX SODIUM 200 MG/2ML IV SOLN
INTRAVENOUS | Status: AC
  Filled 2017-02-01: qty 2

## 2017-02-01 SURGICAL SUPPLY — 35 items
BLADE SURG 15 STRL LF DISP TIS (BLADE) IMPLANT
BLADE SURG 15 STRL SS (BLADE)
BUR CROSS CUT FISSURE 1.6 (BURR) ×1 IMPLANT
BUR CROSS CUT FISSURE 1.6MM (BURR) ×1
BUR EGG ELITE 4.0 (BURR) ×2 IMPLANT
BUR EGG ELITE 4.0MM (BURR) ×1
CANISTER SUCT 3000ML PPV (MISCELLANEOUS) ×3 IMPLANT
COVER SURGICAL LIGHT HANDLE (MISCELLANEOUS) ×3 IMPLANT
GAUZE PACKING FOLDED 2  STR (GAUZE/BANDAGES/DRESSINGS) ×2
GAUZE PACKING FOLDED 2 STR (GAUZE/BANDAGES/DRESSINGS) ×1 IMPLANT
GLOVE BIO SURGEON STRL SZ 6.5 (GLOVE) ×2 IMPLANT
GLOVE BIO SURGEON STRL SZ7 (GLOVE) IMPLANT
GLOVE BIO SURGEON STRL SZ7.5 (GLOVE) ×3 IMPLANT
GLOVE BIO SURGEONS STRL SZ 6.5 (GLOVE) ×1
GLOVE BIOGEL PI IND STRL 6.5 (GLOVE) IMPLANT
GLOVE BIOGEL PI IND STRL 7.0 (GLOVE) ×1 IMPLANT
GLOVE BIOGEL PI INDICATOR 6.5 (GLOVE)
GLOVE BIOGEL PI INDICATOR 7.0 (GLOVE) ×2
GOWN STRL REUS W/ TWL LRG LVL3 (GOWN DISPOSABLE) ×1 IMPLANT
GOWN STRL REUS W/ TWL XL LVL3 (GOWN DISPOSABLE) ×1 IMPLANT
GOWN STRL REUS W/TWL LRG LVL3 (GOWN DISPOSABLE) ×3
GOWN STRL REUS W/TWL XL LVL3 (GOWN DISPOSABLE) ×3
KIT BASIN OR (CUSTOM PROCEDURE TRAY) ×3 IMPLANT
KIT ROOM TURNOVER OR (KITS) ×3 IMPLANT
NEEDLE 22X1 1/2 (OR ONLY) (NEEDLE) ×6 IMPLANT
NS IRRIG 1000ML POUR BTL (IV SOLUTION) ×3 IMPLANT
PAD ARMBOARD 7.5X6 YLW CONV (MISCELLANEOUS) ×3 IMPLANT
SPONGE SURGIFOAM ABS GEL 12-7 (HEMOSTASIS) ×2 IMPLANT
SUT CHROMIC 3 0 PS 2 (SUTURE) ×6 IMPLANT
SWAB COLLECTION DEVICE MRSA (MISCELLANEOUS) ×2 IMPLANT
SWAB CULTURE LIQ STUART DBL (MISCELLANEOUS) ×2 IMPLANT
TOWEL GREEN STERILE (TOWEL DISPOSABLE) IMPLANT
TRAY ENT MC OR (CUSTOM PROCEDURE TRAY) ×3 IMPLANT
TUBING IRRIGATION (MISCELLANEOUS) ×3 IMPLANT
YANKAUER SUCT BULB TIP NO VENT (SUCTIONS) ×3 IMPLANT

## 2017-02-01 NOTE — Anesthesia Procedure Notes (Signed)
Procedure Name: Intubation Date/Time: 02/01/2017 12:44 PM Performed by: Izora Gala Pre-anesthesia Checklist: Patient identified, Emergency Drugs available, Suction available and Patient being monitored Patient Re-evaluated:Patient Re-evaluated prior to inductionOxygen Delivery Method: Circle system utilized Preoxygenation: Pre-oxygenation with 100% oxygen Intubation Type: IV induction Ventilation: Mask ventilation without difficulty Laryngoscope Size: Miller and 3 Grade View: Grade II Nasal Tubes: Right and Magill forceps- large, utilized Tube size: 7.0 mm Number of attempts: 1 Placement Confirmation: ETT inserted through vocal cords under direct vision,  positive ETCO2 and breath sounds checked- equal and bilateral Secured at: 25 cm Tube secured with: Tape Dental Injury: Teeth and Oropharynx as per pre-operative assessment

## 2017-02-01 NOTE — Plan of Care (Signed)
Problem: Food- and Nutrition-Related Knowledge Deficit (NB-1.1) Goal: Nutrition education Formal process to instruct or train a patient/client in a skill or to impart knowledge to help patients/clients voluntarily manage or modify food choices and eating behavior to maintain or improve health. Outcome: Adequate for Discharge  RD consulted for nutrition education regarding diabetes.   Lab Results  Component Value Date   HGBA1C 9.7 (H) 01/29/2017   Spoke with pt, who reports she has multiple family members with DM and is knowledgeable about diagnosis. She typically consumes 2 meals per day (Breakfast: eggs, sausage, toast with cheese and lunch of salad with a variety of fruits and vegetables). She reports she will occasionally consume pizza, however, will choose veggie with thin crust and only consume 2-3 square slices. She typically consumes a salad when eating pizza. Beverages are mainly water or sparkling water, but will occasionally consume tea or lemonade.   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   RD provided "Carbohydrate Counting for People with Diabetes" handout from the Academy of [employer]etics. Discussed different food groups and their effects on blood sugar, emphasizing carbohydrate-containing foods. Provided list of carbohydrates and recommended serving sizes of common foods.  Discussed importance of controlled and consistent carbohydrate intake throughout the day. Provided examples of ways to balance meals/snacks and encouraged intake of high-fiber, whole grain complex carbohydrates. Teach back method used.  Expect fair to good compliance.  Body mass index is 29.04 kg/m.  Pt meets criteria for overweight based on current BMI.  Current diet order is Carb Modified, patient is consuming approximately 50-75% of meals at this time. Labs and medications reviewed. No further nutrition interventions warranted at this time. RD contact information provided. If additional nutrition issues arise, please re-consult RD.  Telly Jawad A. Jimmye Norman, RD, LDN, CDE Pager: [phone] After hours Pager: [phone]

## 2017-02-01 NOTE — Transfer of Care (Signed)
Immediate Anesthesia Transfer of Care Note  Patient: Sydney Morrow  Procedure(s) Performed: Procedure(s): FULL DENTAL EXTRACTIONS (N/A)  Patient Location: PACU  Anesthesia Type:General  Level of Consciousness: awake, alert , oriented and patient cooperative  Airway & Oxygen Therapy: Patient Spontanous Breathing and Patient connected to nasal cannula oxygen  Post-op Assessment: Report given to RN, Post -op Vital signs reviewed and stable and Patient moving all extremities  Post vital signs: Reviewed and stable  Last Vitals:  Vitals:   02/01/17 0810 02/01/17 0814  BP: (!) 184/61   Pulse: (!) 55 68  Resp:    Temp:      Last Pain:  Vitals:   02/01/17 0217  TempSrc:   PainSc: 2       Patients Stated Pain Goal: 2 (74/71/85 5015)  Complications: No apparent anesthesia complications

## 2017-02-01 NOTE — Anesthesia Postprocedure Evaluation (Signed)
Anesthesia Post Note  Patient: MA MUNOZ  Procedure(s) Performed: Procedure(s) (LRB): FULL DENTAL EXTRACTIONS (N/A)     Patient location during evaluation: PACU Anesthesia Type: General Level of consciousness: awake and alert Pain management: pain level controlled Vital Signs Assessment: post-procedure vital signs reviewed and stable Respiratory status: spontaneous breathing, nonlabored ventilation, respiratory function stable and patient connected to nasal cannula oxygen Cardiovascular status: blood pressure returned to baseline and stable Postop Assessment: no signs of nausea or vomiting Anesthetic complications: no    Last Vitals:  Vitals:   02/01/17 1840 02/01/17 2016  BP: (!) 199/74 (!) 189/71  Pulse:  81  Resp:  18  Temp:  37.9 C    Last Pain:  Vitals:   02/01/17 1650  TempSrc:   PainSc: Asleep                 Ryan P Ellender

## 2017-02-01 NOTE — H&P (Signed)
H&P documentation  -History and Physical Reviewed  -Patient has been re-examined  -No change in the plan of care  Leara Rawl M  

## 2017-02-01 NOTE — Anesthesia Preprocedure Evaluation (Addendum)
Anesthesia Evaluation  Patient identified by MRN, date of birth, ID band Patient awake    Reviewed: Allergy & Precautions, NPO status , Patient's Chart, lab work & pertinent test results  Airway Mallampati: IV  TM Distance: >3 FB Neck ROM: Full  Mouth opening: Limited Mouth Opening  Dental  (+) Poor Dentition, Missing   Pulmonary Current Smoker,    Pulmonary exam normal breath sounds clear to auscultation       Cardiovascular hypertension, Pt. on medications and Pt. on home beta blockers Normal cardiovascular exam Rhythm:Regular Rate:Normal     Neuro/Psych negative neurological ROS  negative psych ROS   GI/Hepatic Neg liver ROS, GERD  Medicated and Controlled,  Endo/Other  negative endocrine ROS  Renal/GU negative Renal ROS  negative genitourinary   Musculoskeletal negative musculoskeletal ROS (+)   Abdominal   Peds negative pediatric ROS (+)  Hematology  (+) anemia ,   Anesthesia Other Findings Hyperlipidemia   Reproductive/Obstetrics negative OB ROS                            Anesthesia Physical Anesthesia Plan  ASA: III  Anesthesia Plan: General   Post-op Pain Management:    Induction: Intravenous  PONV Risk Score and Plan: 2 and Ondansetron, Dexamethasone, Propofol and Midazolam  Airway Management Planned: Nasal ETT  Additional Equipment:   Intra-op Plan:   Post-operative Plan: Extubation in OR  Informed Consent: I have reviewed the patients History and Physical, chart, labs and discussed the procedure including the risks, benefits and alternatives for the proposed anesthesia with the patient or authorized representative who has indicated his/her understanding and acceptance.   Dental advisory given  Plan Discussed with: CRNA  Anesthesia Plan Comments:        Anesthesia Quick Evaluation

## 2017-02-01 NOTE — Op Note (Signed)
01/29/2017 - 02/01/2017  1:43 PM  PATIENT:  Sydney Morrow  56 y.o. female  PRE-OPERATIVE DIAGNOSIS:  Nonrestorable teeth # 3, 5, 7, 8, 9, 10, 11, 12, 13, 14, 21, 22, 23, 24, 25, 26, 27, 28, 31, cyst left maxilla, multiple dental abscesses, right facial cellulitis  POST-OPERATIVE DIAGNOSIS:  SAME  PROCEDURE:  Procedure(s):  EXTRACTIONS teeth # 3, 5, 7, 8, 9, 10, 11, 12, 13, 14, 21, 22, 23, 24, 25, 26, 27, 28, 31, Alveoloplasty, removal cyst left maxilla, incision and drainage right mandibular abscess  SURGEON:  Surgeon(s): Diona Browner, DDS  ANESTHESIA:   local and general  EBL:  minimal  DRAINS: none   SPECIMEN:  Cyst left maxilla  COUNTS:  YES  PLAN OF CARE: Discharge to home after PACU  PATIENT DISPOSITION:  PACU - hemodynamically stable.   PROCEDURE DETAILS: Dictation # [id number]  Gae Bon, DMD 02/01/2017 1:43 PM

## 2017-02-01 NOTE — Progress Notes (Signed)
[facility] Progress Note  Patient: Sydney Morrow:[id number]   PCP: [facility], [place] DOB: [birth year]   DOA: 01/29/2017   DOS: 02/01/2017   Date of Service: the patient was seen and examined on 02/01/2017  Subjective: Patient reports no significant change in her condition. Swelling and pain is about the same. No abdominal pain.  Brief hospital course: Pt. with PMH of hypertension and dyslipidemia; admitted on 01/29/2017, presented with complaint of swelling of the jaw, was found to have cellulitis and abscess of the jaw as well as new onset diabetes. Currently further plan is continue clinical monitoring post operative course  Assessment and Plan: 1. Cellulitis of the face. Mandibular abscess. Primary odontogenic, oral surgeon consulted. Appreciate input. Last time patient was informed that the patient will be undergoing tooth extraction as well as incision and drainage of the abscess. Remain nothing by mouth. Underwent extraction off 18 teeth as well as incision and drainage of the mandible or abscess. Multiple organisms growing currently. Since the patient is clinically getting better clindamycin I would continue and follow the results of the culture.  2. New-onset type 2 diabetes mellitus. Patient mentions that she follows up with PCP on a regular basis and has not been told that she has diabetes. A1c 9.7. Will require insulin on discharge. Diabetes coordinator also consulted. Nurse requested to educate the patient on checking CBG as well as on giving herself insulin. Since the patient remains much nothing by mouth, will continue with sliding scale insulin, discontinue scheduled before meals coverage as well as reduce the dose of Lantus from 12 units to 6 units.  3. Essential hypertension Blood pressure elevated. Since the patient remains nothing by mouth at present, will be using IV Lopressor, nitroglycerin ointment to control her blood pressure. May require  clonidine patch as well.  4. Dyslipidemia. Hold statin.  Diet: Carb modified diet DVT Prophylaxis: subcutaneous Heparin  Advance goals of care discussion: full code  Family Communication: no family was present at bedside, at the time of interview.    Disposition:  Discharge to home.  Consultants: Oral surgery Procedures: EXTRACTIONS teeth # 3, 5, 7, 8, 9, 10, 11, 12, 13, 14, 21, 22, 23, 24, 25, 26, 27, 28, 31, Alveoloplasty, removal cyst left maxilla, incision and drainage right mandibular abscess  Antibiotics: Anti-infectives    Start     Dose/Rate Route Frequency Ordered Stop   01/29/17 2000  clindamycin (CLEOCIN) IVPB 600 mg     600 mg 100 mL/hr over 30 Minutes Intravenous Every 6 hours 01/29/17 1758     01/29/17 1345  clindamycin (CLEOCIN) IVPB 600 mg     600 mg 100 mL/hr over 30 Minutes Intravenous  Once 01/29/17 1333 01/29/17 1349       Objective: Physical Exam: Vitals:   02/01/17 1514 02/01/17 1515 02/01/17 1659 02/01/17 1840  BP: (!) 179/85  (!) 201/69 (!) 199/74  Pulse: 74 78    Resp: 16 15    Temp:  98.6 F (37 C)    TempSrc:      SpO2: 100% 100%    Weight:      Height:        Intake/Output Summary (Last 24 hours) at 02/01/17 1931 Last data filed at 02/01/17 1558  Gross per 24 hour  Intake           2962.5 ml  Output               20 ml  Net  2942.5 ml   Filed Weights   01/29/17 1105 01/29/17 1819  Weight: 83.5 kg (184 lb) 84.1 kg (185 lb 6.4 oz)   General: Alert, Awake and Oriented to Time, Place and Person. Appear in mild distress, affect irritable Eyes: PERRL, Conjunctiva normal ENT: Oral Mucosa clear moist. Poor dentition, mild edema of the right jaw Neck: no JVD, no Abnormal Mass Or lumps Cardiovascular: S1 and S2 Present, no Murmur, Peripheral Pulses Present Respiratory: normal respiratory effort, Bilateral Air entry equal and Decreased, no use of accessory muscle, Clear to Auscultation, no Crackles, no wheezes Abdomen: Bowel  Sound present, Soft and no tenderness, no hernia Skin: no redness, no Rash, on induration Extremities: no Pedal edema, no calf tenderness Neurologic: Grossly no focal neuro deficit. Bilaterally Equal motor strength  Data Reviewed: CBC:  Recent Labs Lab 01/29/17 1141 01/29/17 1915 01/30/17 0834 01/31/17 0552 02/01/17 0437  WBC 10.7* 10.2 7.2 6.4 5.7  NEUTROABS 7.8*  --   --   --   --   HGB 14.6 14.7 13.3 12.1 11.3*  HCT 42.1 42.7 40.2 37.5 34.9*  MCV 90.5 91.2 92.4 94.5 92.8  PLT 281 294 246 200 606   Basic Metabolic Panel:  Recent Labs Lab 01/29/17 1141 01/29/17 1915 01/30/17 0834 01/31/17 0552 02/01/17 0437  NA 133*  --  135 138 138  K 4.1  --  3.9 4.0 4.1  CL 98*  --  104 108 110  CO2 22  --  24 22 22   GLUCOSE 266*  --  235* 190* 177*  BUN 13  --  12 12 9   CREATININE 1.02* 1.05* 1.04* 0.93 1.04*  CALCIUM 9.3  --  8.8* 8.5* 8.7*  MG  --   --   --  1.8  --     Liver Function Tests: No results for input(s): AST, ALT, ALKPHOS, BILITOT, PROT, ALBUMIN in the last 168 hours. No results for input(s): LIPASE, AMYLASE in the last 168 hours. No results for input(s): AMMONIA in the last 168 hours. Coagulation Profile: No results for input(s): INR, PROTIME in the last 168 hours. Cardiac Enzymes: No results for input(s): CKTOTAL, CKMB, CKMBINDEX, TROPONINI in the last 168 hours. BNP (last 3 results) No results for input(s): PROBNP in the last 8760 hours. CBG:  Recent Labs Lab 01/31/17 2123 02/01/17 0749 02/01/17 1138 02/01/17 1431 02/01/17 1656  GLUCAP 181* 155* 120* 142* 207*   Studies: No results found.  Scheduled Meds: . enoxaparin (LOVENOX) injection  40 mg Subcutaneous Q24H  . famotidine  20 mg Oral Daily  . HYDROmorphone      . insulin aspart  0-5 Units Subcutaneous QHS  . insulin aspart  0-9 Units Subcutaneous TID WC  . insulin glargine  6 Units Subcutaneous QHS  . labetalol      . loratadine  10 mg Oral Daily  . losartan  50 mg Oral Daily  .  metoprolol tartrate  5 mg Intravenous Q8H  . nitroGLYCERIN  0.4 mg Transdermal Daily  . pravastatin  40 mg Oral q1800   Continuous Infusions: . sodium chloride 75 mL/hr at 01/31/17 2039  . clindamycin (CLEOCIN) IV 600 mg (02/01/17 1731)  . lactated ringers     PRN Meds: acetaminophen **OR** acetaminophen, hydrALAZINE, ketorolac, morphine injection, ondansetron **OR** ondansetron (ZOFRAN) IV, oxyCODONE-acetaminophen, senna-docusate  Time spent: 35 minutes  Author: Berle Mull, MD [facility] Pager: [phone] 02/01/2017 7:31 PM  If 7PM-7AM, please contact night-coverage at [email or web address], password [facility]

## 2017-02-02 ENCOUNTER — Encounter (HOSPITAL_COMMUNITY): Payer: Self-pay | Admitting: Oral Surgery

## 2017-02-02 LAB — BASIC METABOLIC PANEL
ANION GAP: 10 (ref 5–15)
Anion gap: 17 — ABNORMAL HIGH (ref 5–15)
BUN: 12 mg/dL (ref 6–20)
BUN: 12 mg/dL (ref 6–20)
CALCIUM: 8.8 mg/dL — AB (ref 8.9–10.3)
CALCIUM: 8.9 mg/dL (ref 8.9–10.3)
CHLORIDE: 105 mmol/L (ref 101–111)
CHLORIDE: 107 mmol/L (ref 101–111)
CO2: 17 mmol/L — AB (ref 22–32)
CO2: 22 mmol/L (ref 22–32)
CREATININE: 1.13 mg/dL — AB (ref 0.44–1.00)
Creatinine, Ser: 1.05 mg/dL — ABNORMAL HIGH (ref 0.44–1.00)
GFR calc Af Amer: >60 mL/min (ref >60)
GFR calc non Af Amer: 53 mL/min — ABNORMAL LOW (ref >60)
GFR, EST NON AFRICAN AMERICAN: 58 mL/min — AB (ref >60)
GLUCOSE: 154 mg/dL — AB (ref 65–99)
GLUCOSE: 171 mg/dL — AB (ref 65–99)
POTASSIUM: 3.9 mmol/L (ref 3.5–5.1)
Potassium: 4 mmol/L (ref 3.5–5.1)
SODIUM: 139 mmol/L (ref 135–145)
Sodium: 139 mmol/L (ref 135–145)

## 2017-02-02 LAB — GLUCOSE, CAPILLARY
GLUCOSE-CAPILLARY: 159 mg/dL — AB (ref 65–99)
Glucose-Capillary: 158 mg/dL — ABNORMAL HIGH (ref 65–99)
Glucose-Capillary: 192 mg/dL — ABNORMAL HIGH (ref 65–99)

## 2017-02-02 LAB — CBC
HEMATOCRIT: 38 % (ref 36.0–46.0)
HEMOGLOBIN: 12.4 g/dL (ref 12.0–15.0)
MCH: 30 pg (ref 26.0–34.0)
MCHC: 32.6 g/dL (ref 30.0–36.0)
MCV: 92 fL (ref 78.0–100.0)
Platelets: 316 10*3/uL (ref 150–400)
RBC: 4.13 MIL/uL (ref 3.87–5.11)
RDW: 12.7 % (ref 11.5–15.5)
WBC: 9.4 10*3/uL (ref 4.0–10.5)

## 2017-02-02 LAB — MAGNESIUM: MAGNESIUM: 1.9 mg/dL (ref 1.7–2.4)

## 2017-02-02 MED ORDER — INSULIN GLARGINE 100 UNIT/ML ~~LOC~~ SOLN
10.0000 [IU] | Freq: Every day | SUBCUTANEOUS | Status: DC
  Administered 2017-02-03 (×2): 10 [IU] via SUBCUTANEOUS
  Filled 2017-02-02 (×2): qty 0.1

## 2017-02-02 MED ORDER — METOPROLOL TARTRATE 50 MG PO TABS
50.0000 mg | ORAL_TABLET | Freq: Two times a day (BID) | ORAL | Status: DC
  Administered 2017-02-02 – 2017-02-04 (×4): 50 mg via ORAL
  Filled 2017-02-02 (×4): qty 1

## 2017-02-02 MED ORDER — KETOROLAC TROMETHAMINE 30 MG/ML IJ SOLN
30.0000 mg | Freq: Three times a day (TID) | INTRAMUSCULAR | Status: DC
  Administered 2017-02-02 – 2017-02-03 (×2): 30 mg via INTRAVENOUS
  Filled 2017-02-02 (×2): qty 1

## 2017-02-02 NOTE — Progress Notes (Signed)
[facility] Progress Note  Patient: Sydney Morrow DXI:[id number]   PCP: [facility] [place] DOB: [birth year]   DOA: 01/29/2017   DOS: 02/02/2017   Date of Service: the patient was seen and examined on 02/02/2017  Subjective: Pain and swelling is still present. Unable to tolerate anything by mouth until this morning. No abdominal pain no nausea or vomiting.  Brief hospital course: Pt. with PMH of hypertension and dyslipidemia; admitted on 01/29/2017, presented with complaint of swelling of the jaw, was found to have cellulitis and abscess of the jaw as well as new onset diabetes. Currently further plan is continue clinical monitoring post operative course  Assessment and Plan: 1. Cellulitis of the face. Mandibular abscess. Primary odontogenic, oral surgeon consulted. Appreciate input. Last time patient was informed that the patient will be undergoing tooth extraction as well as incision and drainage of the abscess. Remain nothing by mouth. Underwent extraction off 18 teeth as well as incision and drainage of the mandible or abscess. Multiple organisms growing currently. Since the patient is clinically getting better clindamycin I would continue and follow the results of the culture.  2. New-onset type 2 diabetes mellitus. Patient mentions that she follows up with PCP on a regular basis and has not been told that she has diabetes. A1c 9.7. Will require insulin on discharge. Diabetes coordinator also consulted. Nurse requested to educate the patient on checking CBG as well as on giving herself insulin.  3. Essential hypertension Blood pressure elevated. Resuming oral blood pressure medications.  4. Dyslipidemia. Resume statin.  Diet: Carb modified diet, full liquid DVT Prophylaxis: subcutaneous Heparin  Advance goals of care discussion: full code  Family Communication: no family was present at bedside, at the time of interview.    Disposition:  Discharge to  home.  Consultants: Oral surgery Procedures: EXTRACTIONS teeth # 3, 5, 7, 8, 9, 10, 11, 12, 13, 14, 21, 22, 23, 24, 25, 26, 27, 28, 31, Alveoloplasty, removal cyst left maxilla, incision and drainage right mandibular abscess  Antibiotics: Anti-infectives    Start     Dose/Rate Route Frequency Ordered Stop   01/29/17 2000  clindamycin (CLEOCIN) IVPB 600 mg     600 mg 100 mL/hr over 30 Minutes Intravenous Every 6 hours 01/29/17 1758     01/29/17 1345  clindamycin (CLEOCIN) IVPB 600 mg     600 mg 100 mL/hr over 30 Minutes Intravenous  Once 01/29/17 1333 01/29/17 1349       Objective: Physical Exam: Vitals:   02/01/17 2242 02/02/17 0141 02/02/17 0435 02/02/17 1509  BP: (!) 180/64 (!) 168/62 (!) 159/64 (!) 166/83  Pulse: 78 86 89 87  Resp: 17  17 16   Temp: 100.2 F (37.9 C)  98.7 F (37.1 C) 98.4 F (36.9 C)  TempSrc:      SpO2:   97% 100%  Weight:      Height:       No intake or output data in the 24 hours ending 02/02/17 1835 Filed Weights   01/29/17 1105 01/29/17 1819  Weight: 83.5 kg (184 lb) 84.1 kg (185 lb 6.4 oz)   General: Alert, Awake and Oriented to Time, Place and Person. Appear in mild distress, affect normal  Eyes: PERRL, Conjunctiva normal ENT: Swelling of the face as well as jaw. Difficult to assess further Cardiovascular: S1 and S2 Present, no Murmur, Peripheral Pulses Present Respiratory: normal respiratory effort, Bilateral Air entry equal and Decreased, no use of accessory muscle, Clear to Auscultation, no Crackles,  no wheezes Abdomen: Bowel Sound present, Soft and no tenderness, no hernia Skin: no redness, no Rash, on induration Extremities: no Pedal edema, no calf tenderness Neurologic: Grossly no focal neuro deficit. Bilaterally Equal motor strength  Data Reviewed: CBC:  Recent Labs Lab 01/29/17 1141 01/29/17 1915 01/30/17 0834 01/31/17 0552 02/01/17 0437 02/02/17 0427  WBC 10.7* 10.2 7.2 6.4 5.7 9.4  NEUTROABS 7.8*  --   --   --   --   --    HGB 14.6 14.7 13.3 12.1 11.3* 12.4  HCT 42.1 42.7 40.2 37.5 34.9* 38.0  MCV 90.5 91.2 92.4 94.5 92.8 92.0  PLT 281 294 246 200 247 169   Basic Metabolic Panel:  Recent Labs Lab 01/30/17 0834 01/31/17 0552 02/01/17 0437 02/02/17 0427 02/02/17 1208  NA 135 138 138 139 139  K 3.9 4.0 4.1 3.9 4.0  CL 104 108 110 105 107  CO2 24 22 22  17* 22  GLUCOSE 235* 190* 177* 154* 171*  BUN 12 12 9 12 12   CREATININE 1.04* 0.93 1.04* 1.05* 1.13*  CALCIUM 8.8* 8.5* 8.7* 8.8* 8.9  MG  --  1.8  --  1.9  --     Liver Function Tests: No results for input(s): AST, ALT, ALKPHOS, BILITOT, PROT, ALBUMIN in the last 168 hours. No results for input(s): LIPASE, AMYLASE in the last 168 hours. No results for input(s): AMMONIA in the last 168 hours. Coagulation Profile: No results for input(s): INR, PROTIME in the last 168 hours. Cardiac Enzymes: No results for input(s): CKTOTAL, CKMB, CKMBINDEX, TROPONINI in the last 168 hours. BNP (last 3 results) No results for input(s): PROBNP in the last 8760 hours. CBG:  Recent Labs Lab 02/01/17 2013 02/01/17 2302 02/02/17 0819 02/02/17 1202 02/02/17 1736  GLUCAP 164* 199* 159* 158* 192*   Studies: No results found.  Scheduled Meds: . enoxaparin (LOVENOX) injection  40 mg Subcutaneous Q24H  . famotidine  20 mg Oral Daily  . insulin aspart  0-5 Units Subcutaneous QHS  . insulin aspart  0-9 Units Subcutaneous TID WC  . insulin glargine  6 Units Subcutaneous QHS  . loratadine  10 mg Oral Daily  . losartan  50 mg Oral Daily  . metoprolol tartrate  5 mg Intravenous Q8H  . nitroGLYCERIN  0.4 mg Transdermal Daily  . pravastatin  40 mg Oral q1800   Continuous Infusions: . sodium chloride 75 mL/hr at 02/02/17 1816  . clindamycin (CLEOCIN) IV Stopped (02/02/17 1740)   PRN Meds: acetaminophen **OR** acetaminophen, hydrALAZINE, ketorolac, morphine injection, ondansetron **OR** ondansetron (ZOFRAN) IV, oxyCODONE-acetaminophen, RESOURCE THICKENUP CLEAR,  senna-docusate  Time spent: 35 minutes  Author: Berle Mull, MD [facility] Pager: [phone] 02/02/2017 6:35 PM  If 7PM-7AM, please contact night-coverage at [email or web address], password [facility]

## 2017-02-02 NOTE — Progress Notes (Signed)
Sheritha E Babiarz PROGRESS NOTE:   SUBJECTIVE: pain and swelling  OBJECTIVE:  Vitals: Blood pressure (!) 159/64, pulse 89, temperature 98.7 F (37.1 C), resp. rate 17, height 5\' 7"  (1.702 m), weight 185 lb 6.4 oz (84.1 kg), SpO2 97 %. Lab results: Results for orders placed or performed during the hospital encounter of 01/29/17 (from the past 24 hour(s))  Surgical pcr screen     Status: None   Collection Time: 02/01/17 10:39 AM  Result Value Ref Range   MRSA, PCR NEGATIVE NEGATIVE   Staphylococcus aureus NEGATIVE NEGATIVE  Glucose, capillary     Status: Abnormal   Collection Time: 02/01/17 11:38 AM  Result Value Ref Range   Glucose-Capillary 120 (H) 65 - 99 mg/dL  Aerobic/Anaerobic Culture (surgical/deep wound)     Status: None (Preliminary result)   Collection Time: 02/01/17  1:22 PM  Result Value Ref Range   Specimen Description ABSCESS    Special Requests ORAL ABSCESS    Gram Stain      MODERATE WBC PRESENT, PREDOMINANTLY PMN FEW SQUAMOUS EPITHELIAL CELLS PRESENT MODERATE GRAM POSITIVE COCCI IN PAIRS FEW GRAM POSITIVE RODS FEW GRAM NEGATIVE RODS    Culture PENDING    Report Status PENDING   Glucose, capillary     Status: Abnormal   Collection Time: 02/01/17  2:31 PM  Result Value Ref Range   Glucose-Capillary 142 (H) 65 - 99 mg/dL   Comment 1 Notify RN    Comment 2 Document in Chart   Glucose, capillary     Status: Abnormal   Collection Time: 02/01/17  4:56 PM  Result Value Ref Range   Glucose-Capillary 207 (H) 65 - 99 mg/dL  Glucose, capillary     Status: Abnormal   Collection Time: 02/01/17  8:13 PM  Result Value Ref Range   Glucose-Capillary 164 (H) 65 - 99 mg/dL  Glucose, capillary     Status: Abnormal   Collection Time: 02/01/17 11:02 PM  Result Value Ref Range   Glucose-Capillary 199 (H) 65 - 99 mg/dL  CBC     Status: None   Collection Time: 02/02/17  4:27 AM  Result Value Ref Range   WBC 9.4 4.0 - 10.5 K/uL   RBC 4.13 3.87 - 5.11 MIL/uL   Hemoglobin  12.4 12.0 - 15.0 g/dL   HCT 38.0 36.0 - 46.0 %   MCV 92.0 78.0 - 100.0 fL   MCH 30.0 26.0 - 34.0 pg   MCHC 32.6 30.0 - 36.0 g/dL   RDW 12.7 11.5 - 15.5 %   Platelets 316 150 - 400 K/uL  Basic metabolic panel     Status: Abnormal   Collection Time: 02/02/17  4:27 AM  Result Value Ref Range   Sodium 139 135 - 145 mmol/L   Potassium 3.9 3.5 - 5.1 mmol/L   Chloride 105 101 - 111 mmol/L   CO2 17 (L) 22 - 32 mmol/L   Glucose, Bld 154 (H) 65 - 99 mg/dL   BUN 12 6 - 20 mg/dL   Creatinine, Ser 1.05 (H) 0.44 - 1.00 mg/dL   Calcium 8.8 (L) 8.9 - 10.3 mg/dL   GFR calc non Af Amer 58 (L) >60 mL/min   GFR calc Af Amer >60 >60 mL/min   Anion gap 17 (H) 5 - 15  Magnesium     Status: None   Collection Time: 02/02/17  4:27 AM  Result Value Ref Range   Magnesium 1.9 1.7 - 2.4 mg/dL  Glucose, capillary  Status: Abnormal   Collection Time: 02/02/17  8:19 AM  Result Value Ref Range   Glucose-Capillary 159 (H) 65 - 99 mg/dL   Radiology Results: No results found. General appearance: alert, cooperative and no distress Head: Normocephalic, without obvious abnormality, atraumatic Eyes: negative Nose: Nares normal. Septum midline. Mucosa normal. No drainage or sinus tenderness. Throat: mild-moderate edema right mandible, sutures intact, hemostatic. pharynx clear no edema. Neck: no adenopathy, supple, symmetrical, trachea midline and thyroid not enlarged, symmetric, no tenderness/mass/nodules  ASSESSMENT: Post-op edema/pain.   PLAN: Antibiotics, pain meds. OK for D/C from oral surgery standpoint.    Gae Bon 02/02/2017

## 2017-02-03 LAB — CBC WITH DIFFERENTIAL/PLATELET
BASOS PCT: 1 %
Basophils Absolute: 0 10*3/uL (ref 0.0–0.1)
EOS ABS: 0.1 10*3/uL (ref 0.0–0.7)
Eosinophils Relative: 1 %
HCT: 36.4 % (ref 36.0–46.0)
HEMOGLOBIN: 11.3 g/dL — AB (ref 12.0–15.0)
Lymphocytes Relative: 45 %
Lymphs Abs: 2.7 10*3/uL (ref 0.7–4.0)
MCH: 29.6 pg (ref 26.0–34.0)
MCHC: 31 g/dL (ref 30.0–36.0)
MCV: 95.3 fL (ref 78.0–100.0)
MONOS PCT: 8 %
Monocytes Absolute: 0.5 10*3/uL (ref 0.1–1.0)
Neutro Abs: 2.7 10*3/uL (ref 1.7–7.7)
Neutrophils Relative %: 45 %
Platelets: 275 10*3/uL (ref 150–400)
RBC: 3.82 MIL/uL — ABNORMAL LOW (ref 3.87–5.11)
RDW: 13.3 % (ref 11.5–15.5)
WBC: 6 10*3/uL (ref 4.0–10.5)

## 2017-02-03 LAB — BASIC METABOLIC PANEL
Anion gap: 10 (ref 5–15)
BUN: 14 mg/dL (ref 6–20)
CALCIUM: 8.8 mg/dL — AB (ref 8.9–10.3)
CO2: 22 mmol/L (ref 22–32)
CREATININE: 1.17 mg/dL — AB (ref 0.44–1.00)
Chloride: 106 mmol/L (ref 101–111)
GFR calc Af Amer: 59 mL/min — ABNORMAL LOW (ref >60)
GFR calc non Af Amer: 51 mL/min — ABNORMAL LOW (ref >60)
Glucose, Bld: 168 mg/dL — ABNORMAL HIGH (ref 65–99)
Potassium: 4.3 mmol/L (ref 3.5–5.1)
SODIUM: 138 mmol/L (ref 135–145)

## 2017-02-03 LAB — GLUCOSE, CAPILLARY
GLUCOSE-CAPILLARY: 119 mg/dL — AB (ref 65–99)
GLUCOSE-CAPILLARY: 134 mg/dL — AB (ref 65–99)
GLUCOSE-CAPILLARY: 172 mg/dL — AB (ref 65–99)
Glucose-Capillary: 116 mg/dL — ABNORMAL HIGH (ref 65–99)
Glucose-Capillary: 144 mg/dL — ABNORMAL HIGH (ref 65–99)

## 2017-02-03 LAB — MAGNESIUM: MAGNESIUM: 2.1 mg/dL (ref 1.7–2.4)

## 2017-02-03 MED ORDER — POLYETHYLENE GLYCOL 3350 17 G PO PACK
17.0000 g | PACK | Freq: Every day | ORAL | Status: DC
  Administered 2017-02-03: 17 g via ORAL
  Filled 2017-02-03 (×2): qty 1

## 2017-02-03 MED ORDER — GI COCKTAIL ~~LOC~~
30.0000 mL | Freq: Once | ORAL | Status: AC
  Administered 2017-02-03: 30 mL via ORAL
  Filled 2017-02-03: qty 30

## 2017-02-03 MED ORDER — SENNOSIDES-DOCUSATE SODIUM 8.6-50 MG PO TABS
1.0000 | ORAL_TABLET | Freq: Two times a day (BID) | ORAL | Status: DC
  Administered 2017-02-03 – 2017-02-04 (×3): 1 via ORAL
  Filled 2017-02-03 (×3): qty 1

## 2017-02-03 MED ORDER — PANTOPRAZOLE SODIUM 40 MG PO TBEC
40.0000 mg | DELAYED_RELEASE_TABLET | Freq: Every day | ORAL | Status: DC
  Administered 2017-02-03 – 2017-02-04 (×2): 40 mg via ORAL
  Filled 2017-02-03 (×2): qty 1

## 2017-02-03 MED ORDER — GLIMEPIRIDE 2 MG PO TABS
2.0000 mg | ORAL_TABLET | Freq: Every day | ORAL | Status: DC
  Administered 2017-02-03 – 2017-02-04 (×2): 2 mg via ORAL
  Filled 2017-02-03 (×2): qty 1

## 2017-02-03 MED ORDER — METFORMIN HCL 500 MG PO TABS
1000.0000 mg | ORAL_TABLET | Freq: Two times a day (BID) | ORAL | Status: DC
  Administered 2017-02-03 – 2017-02-04 (×3): 1000 mg via ORAL
  Filled 2017-02-03: qty 2

## 2017-02-03 MED ORDER — MAGNESIUM HYDROXIDE 400 MG/5ML PO SUSP
15.0000 mL | Freq: Once | ORAL | Status: AC
  Administered 2017-02-03: 15 mL via ORAL
  Filled 2017-02-03: qty 30

## 2017-02-03 MED ORDER — LOSARTAN POTASSIUM 50 MG PO TABS
100.0000 mg | ORAL_TABLET | Freq: Every day | ORAL | Status: DC
  Administered 2017-02-03: 100 mg via ORAL
  Filled 2017-02-03 (×2): qty 2

## 2017-02-03 MED ORDER — METFORMIN HCL 500 MG PO TABS
1000.0000 mg | ORAL_TABLET | Freq: Two times a day (BID) | ORAL | Status: DC
  Filled 2017-02-03 (×2): qty 2

## 2017-02-03 MED ORDER — SODIUM BICARBONATE/SODIUM CHLORIDE MOUTHWASH
OROMUCOSAL | Status: DC | PRN
  Filled 2017-02-03: qty 1000

## 2017-02-03 NOTE — Progress Notes (Signed)
[facility] Progress Note  Patient: Sydney Morrow EUM:[id number]   PCP: [facility] [place] DOB: [birth year]   DOA: 01/29/2017   DOS: 02/03/2017   Date of Service: the patient was seen and examined on 02/03/2017  Subjective: Feeling better. Pain is well-controlled. Swelling is also about the same. Complain about severe acid reflux. No nausea no vomiting.  Brief hospital course: Pt. with PMH of hypertension and dyslipidemia; admitted on 01/29/2017, presented with complaint of swelling of the jaw, was found to have cellulitis and abscess of the jaw as well as new onset diabetes. Currently further plan is continue clinical monitoring post operative course  Assessment and Plan: 1. Cellulitis of the face. Mandibular abscess. Primary odontogenic, oral surgeon consulted. Appreciate input. Last time patient was informed that the patient will be undergoing tooth extraction as well as incision and drainage of the abscess. Tolerating full liquid diet, I would advance to dysphagia 1 diet and monitor prior to discharge Underwent extraction off 18 teeth as well as incision and drainage of the mandible or abscess. Multiple organisms growing currently. Since the patient is clinically getting better clindamycin I would continue and follow the results of the culture.  2. New-onset type 2 diabetes mellitus. Patient mentions that she follows up with PCP on a regular basis and has not been told that she has diabetes. A1c 9.7. Patient would like to go home on oral medication. We provided metformin as well as Amaryl in the hospital. Monitor.  3. Essential hypertension Blood pressure elevated. Resuming oral blood pressure medications.  4. Dyslipidemia. Resume statin.  Diet: Carb modified diet, full liquid DVT Prophylaxis: subcutaneous Heparin  Advance goals of care discussion: full code  Family Communication: no family was present at bedside, at the time of interview.    Disposition:    Discharge to home.  Consultants: Oral surgery Procedures: EXTRACTIONS teeth # 3, 5, 7, 8, 9, 10, 11, 12, 13, 14, 21, 22, 23, 24, 25, 26, 27, 28, 31, Alveoloplasty, removal cyst left maxilla, incision and drainage right mandibular abscess  Antibiotics: Anti-infectives    Start     Dose/Rate Route Frequency Ordered Stop   01/29/17 2000  clindamycin (CLEOCIN) IVPB 600 mg     600 mg 100 mL/hr over 30 Minutes Intravenous Every 6 hours 01/29/17 1758     01/29/17 1345  clindamycin (CLEOCIN) IVPB 600 mg     600 mg 100 mL/hr over 30 Minutes Intravenous  Once 01/29/17 1333 01/29/17 1349       Objective: Physical Exam: Vitals:   02/03/17 0015 02/03/17 0449 02/03/17 0945 02/03/17 1714  BP: (!) 158/90 (!) 172/62 (!) 164/60 (!) 175/73  Pulse: (!) 57 (!) 53 60 64  Resp: 18 18    Temp: 98.6 F (37 C) 98.3 F (36.8 C)  98.2 F (36.8 C)  TempSrc:    Oral  SpO2: 98% 100%  98%  Weight:      Height:        Intake/Output Summary (Last 24 hours) at 02/03/17 1737 Last data filed at 02/03/17 0444  Gross per 24 hour  Intake              885 ml  Output                0 ml  Net              885 ml   Filed Weights   01/29/17 1105 01/29/17 1819  Weight: 83.5 kg (184 lb) 84.1  kg (185 lb 6.4 oz)   General: Alert, Awake and Oriented to Time, Place and Person. Appear in mild distress, affect normal  Eyes: PERRL, Conjunctiva normal ENT: Swelling of the face as well as jaw. Difficult to assess further Cardiovascular: S1 and S2 Present, no Murmur, Peripheral Pulses Present Respiratory: normal respiratory effort, Bilateral Air entry equal and Decreased, no use of accessory muscle, Clear to Auscultation, no Crackles, no wheezes Abdomen: Bowel Sound present, Soft and no tenderness, no hernia Skin: no redness, no Rash, on induration Extremities: no Pedal edema, no calf tenderness Neurologic: Grossly no focal neuro deficit. Bilaterally Equal motor strength  Data Reviewed: CBC:  Recent Labs Lab  01/29/17 1141  01/30/17 0834 01/31/17 0552 02/01/17 0437 02/02/17 0427 02/03/17 0424  WBC 10.7*  < > 7.2 6.4 5.7 9.4 6.0  NEUTROABS 7.8*  --   --   --   --   --  2.7  HGB 14.6  < > 13.3 12.1 11.3* 12.4 11.3*  HCT 42.1  < > 40.2 37.5 34.9* 38.0 36.4  MCV 90.5  < > 92.4 94.5 92.8 92.0 95.3  PLT 281  < > 246 200 247 316 275  < > = values in this interval not displayed. Basic Metabolic Panel:  Recent Labs Lab 01/31/17 0552 02/01/17 0437 02/02/17 0427 02/02/17 1208 02/03/17 0424  NA 138 138 139 139 138  K 4.0 4.1 3.9 4.0 4.3  CL 108 110 105 107 106  CO2 22 22 17* 22 22  GLUCOSE 190* 177* 154* 171* 168*  BUN 12 9 12 12 14   CREATININE 0.93 1.04* 1.05* 1.13* 1.17*  CALCIUM 8.5* 8.7* 8.8* 8.9 8.8*  MG 1.8  --  1.9  --  2.1    Liver Function Tests: No results for input(s): AST, ALT, ALKPHOS, BILITOT, PROT, ALBUMIN in the last 168 hours. No results for input(s): LIPASE, AMYLASE in the last 168 hours. No results for input(s): AMMONIA in the last 168 hours. Coagulation Profile: No results for input(s): INR, PROTIME in the last 168 hours. Cardiac Enzymes: No results for input(s): CKTOTAL, CKMB, CKMBINDEX, TROPONINI in the last 168 hours. BNP (last 3 results) No results for input(s): PROBNP in the last 8760 hours. CBG:  Recent Labs Lab 02/02/17 1202 02/02/17 1736 02/03/17 0011 02/03/17 0844 02/03/17 1156  GLUCAP 158* 192* 172* 116* 144*   Studies: No results found.  Scheduled Meds: . famotidine  20 mg Oral Daily  . glimepiride  2 mg Oral Q breakfast  . insulin aspart  0-5 Units Subcutaneous QHS  . insulin aspart  0-9 Units Subcutaneous TID WC  . insulin glargine  10 Units Subcutaneous QHS  . loratadine  10 mg Oral Daily  . losartan  100 mg Oral Daily  . metFORMIN  1,000 mg Oral BID WC  . metFORMIN  1,000 mg Oral BID WC  . metoprolol tartrate  50 mg Oral BID  . pantoprazole  40 mg Oral Daily  . polyethylene glycol  17 g Oral Daily  . pravastatin  40 mg Oral q1800   . senna-docusate  1 tablet Oral BID   Continuous Infusions: . sodium chloride 75 mL/hr at 02/03/17 0444  . clindamycin (CLEOCIN) IV 600 mg (02/03/17 1715)   PRN Meds: acetaminophen **OR** acetaminophen, hydrALAZINE, morphine injection, ondansetron **OR** ondansetron (ZOFRAN) IV, oxyCODONE-acetaminophen, RESOURCE THICKENUP CLEAR, sodium bicarbonate/sodium chloride  Time spent: 35 minutes  Author: Berle Mull, MD [facility] Pager: [phone] 02/03/2017 5:37 PM  If 7PM-7AM, please contact night-coverage at  [email or web address], password [facility]

## 2017-02-03 NOTE — Plan of Care (Signed)
Problem: Skin Integrity: Goal: Risk for impaired skin integrity will decrease Outcome: Progressing Skin assessed, no red or areas of breakdown noted.  Patient is ambulatory and is able to reposition self frequently.  Pt verbalized understanding of reporting changes in skin integrity to RN.  Patient progressing towards goal.

## 2017-02-04 LAB — BASIC METABOLIC PANEL
Anion gap: 8 (ref 5–15)
BUN: 10 mg/dL (ref 6–20)
CALCIUM: 8.8 mg/dL — AB (ref 8.9–10.3)
CO2: 22 mmol/L (ref 22–32)
CREATININE: 0.95 mg/dL (ref 0.44–1.00)
Chloride: 110 mmol/L (ref 101–111)
GFR calc Af Amer: >60 mL/min (ref >60)
GFR calc non Af Amer: >60 mL/min (ref >60)
GLUCOSE: 97 mg/dL (ref 65–99)
Potassium: 4.3 mmol/L (ref 3.5–5.1)
Sodium: 140 mmol/L (ref 135–145)

## 2017-02-04 LAB — GLUCOSE, CAPILLARY: Glucose-Capillary: 123 mg/dL — ABNORMAL HIGH (ref 65–99)

## 2017-02-04 MED ORDER — FAMOTIDINE 20 MG PO TABS
20.0000 mg | ORAL_TABLET | Freq: Every day | ORAL | 0 refills | Status: DC
Start: 2017-02-04 — End: 2017-04-09

## 2017-02-04 MED ORDER — METFORMIN HCL 500 MG PO TABS: 500.0000 mg | ORAL_TABLET | Freq: Two times a day (BID) | ORAL | 0 refills | Status: DC

## 2017-02-04 MED ORDER — CLINDAMYCIN HCL 300 MG PO CAPS: 600.0000 mg | ORAL_CAPSULE | Freq: Three times a day (TID) | ORAL | 0 refills | Status: AC

## 2017-02-04 MED ORDER — LOSARTAN POTASSIUM 100 MG PO TABS: 100.0000 mg | ORAL_TABLET | Freq: Every day | ORAL | 0 refills | Status: DC

## 2017-02-04 MED ORDER — GLIMEPIRIDE 2 MG PO TABS: 2.0000 mg | ORAL_TABLET | Freq: Every day | ORAL | 0 refills | Status: DC

## 2017-02-04 MED ORDER — POLYETHYLENE GLYCOL 3350 17 G PO PACK: 17.0000 g | PACK | Freq: Every day | ORAL | 0 refills | Status: DC | PRN

## 2017-02-04 MED ORDER — SACCHAROMYCES BOULARDII 250 MG PO CAPS
250.0000 mg | ORAL_CAPSULE | Freq: Two times a day (BID) | ORAL | Status: DC
  Administered 2017-02-04: 250 mg via ORAL
  Filled 2017-02-04: qty 1

## 2017-02-04 MED ORDER — OXYCODONE-ACETAMINOPHEN 5-325 MG PO TABS: 1.0000 | ORAL_TABLET | Freq: Three times a day (TID) | ORAL | 0 refills | Status: DC | PRN

## 2017-02-04 MED ORDER — SACCHAROMYCES BOULARDII 250 MG PO CAPS: 250.0000 mg | ORAL_CAPSULE | Freq: Two times a day (BID) | ORAL | 0 refills | Status: DC

## 2017-02-04 MED ORDER — SODIUM BICARBONATE/SODIUM CHLORIDE MOUTHWASH: 1.0000 "application " | OROMUCOSAL | 0 refills | Status: DC | PRN

## 2017-02-04 MED ORDER — FAMOTIDINE 20 MG PO TABS: 20.0000 mg | ORAL_TABLET | Freq: Every day | ORAL | 0 refills | Status: DC

## 2017-02-04 MED ORDER — BLOOD GLUCOSE MONITOR KIT: PACK | 0 refills | Status: DC

## 2017-02-04 MED ORDER — CLINDAMYCIN HCL 300 MG PO CAPS: 600.0000 mg | ORAL_CAPSULE | Freq: Three times a day (TID) | ORAL | 0 refills | Status: DC

## 2017-02-04 MED FILL — POLYETHYLENE GLYCOL 3350 PO: 15 days supply | Qty: 255 | Fill #0

## 2017-02-04 MED FILL — LOSARTAN POTASSIUM 100 MG T: 100 | 30 days supply | Qty: 30 | Fill #0

## 2017-02-04 MED FILL — FAMOTIDINE 20 MG TABLET: 20 | 14 days supply | Qty: 14 | Fill #0

## 2017-02-04 MED FILL — GLIMEPIRIDE 2 MG TABLET: 2 | 30 days supply | Qty: 30 | Fill #0

## 2017-02-04 MED FILL — ?METFORMIN HCL 500MG TABLET: 500 | 30 days supply | Qty: 60 | Fill #0

## 2017-02-04 MED FILL — CLINDAMYCIN HCL 300 MG CAP: 300 | 3 days supply | Qty: 18 | Fill #0

## 2017-02-04 NOTE — Discharge Instructions (Signed)
Blood Glucose Monitoring, Adult Monitoring your blood sugar (glucose) helps you manage your diabetes. It also helps you and your health care provider determine how well your diabetes management plan is working. Blood glucose monitoring involves checking your blood glucose as often as directed, and keeping a record (log) of your results over time. Why should I monitor my blood glucose? Checking your blood glucose regularly can:  Help you understand how food, exercise, illnesses, and medicines affect your blood glucose.  Let you know what your blood glucose is at any time. You can quickly tell if you are having low blood glucose (hypoglycemia) or high blood glucose (hyperglycemia).  Help you and your health care provider adjust your medicines as needed.  When should I check my blood glucose? Follow instructions from your health care provider about how often to check your blood glucose. This may depend on:  The type of diabetes you have.  How well-controlled your diabetes is.  Medicines you are taking.  If you have type 2 diabetes:  If you take insulin or other diabetes medicines, check your blood glucose at least 2 times a day.  If you are on intensive insulin therapy, check your blood glucose at least 4 times a day. Occasionally, you may also need to check between 2:00 a.m. and 3:00 a.m., as directed.  Also check your blood glucose: ? Before and after exercise. ? Before potentially dangerous tasks, like driving or using heavy machinery.  You may need to check your blood glucose more often if: ? Your medicine is being adjusted. ? Your diabetes is not well-controlled. ? You are ill. What is a blood glucose log?  A blood glucose log is a record of your blood glucose readings. It helps you and your health care provider: ? Look for patterns in your blood glucose over time. ? Adjust your diabetes management plan as needed.  Every time you check your blood glucose, write down your  result and notes about things that may be affecting your blood glucose, such as your diet and exercise for the day.  Most glucose meters store a record of glucose readings in the meter. Some meters allow you to download your records to a computer. How do I check my blood glucose? Follow these steps to get accurate readings of your blood glucose: Supplies needed   Blood glucose meter.  Test strips for your meter. Each meter has its own strips. You must use the strips that come with your meter.  A needle to prick your finger (lancet). Do not use lancets more than once.  A device that holds the lancet (lancing device).  A journal or log book to write down your results. Procedure  Wash your hands with soap and water.  Prick the side of your finger (not the tip) with the lancet. Use a different finger each time.  Gently rub the finger until a small drop of blood appears.  Follow instructions that come with your meter for inserting the test strip, applying blood to the strip, and using your blood glucose meter.  Write down your result and any notes. Alternative testing sites  Some meters allow you to use areas of your body other than your finger (alternative sites) to test your blood.  If you think you may have hypoglycemia, or if you have hypoglycemia unawareness, do not use alternative sites. Use your finger instead.  Alternative sites may not be as accurate as the fingers, because blood flow is slower in these areas. This  means that the result you get may be delayed, and it may be different from the result that you would get from your finger.  The most common alternative sites are: ? Forearm. ? Thigh. ? Palm of the hand. Additional tips  Always keep your supplies with you.  If you have questions or need help, all blood glucose meters have a 24-hour hotline number that you can call. You may also contact your health care provider.  After you use a few boxes of test strips,  adjust (calibrate) your blood glucose meter by following instructions that came with your meter. This information is not intended to replace advice given to you by your health care provider. Make sure you discuss any questions you have with your health care provider. Document Released: 08/02/2003 Document Revised: 02/17/2016 Document Reviewed: 01/09/2016 Elsevier Interactive Patient Education  2017 [employer] for Diabetes Mellitus, Adult Carbohydrate counting is a method for keeping track of how many carbohydrates you eat. Eating carbohydrates naturally increases the amount of sugar (glucose) in the blood. Counting how many carbohydrates you eat helps keep your blood glucose within normal limits, which helps you manage your diabetes (diabetes mellitus). It is important to know how many carbohydrates you can safely have in each meal. This is different for every person. A diet and nutrition specialist (registered dietitian) can help you make a meal plan and calculate how many carbohydrates you should have at each meal and snack. Carbohydrates are found in the following foods:  Grains, such as breads and cereals.  Dried beans and soy products.  Starchy vegetables, such as potatoes, peas, and corn.  Fruit and fruit juices.  Milk and yogurt.  Sweets and snack foods, such as cake, cookies, candy, chips, and soft drinks.  How do I count carbohydrates? There are two ways to count carbohydrates in food. You can use either of the methods or a combination of both. Reading "Nutrition Facts" on packaged food The "Nutrition Facts" list is included on the labels of almost all packaged foods and beverages in the [place] It includes:  The serving size.  Information about nutrients in each serving, including the grams (g) of carbohydrate per serving.  To use the Nutrition Facts":  Decide how many servings you will have.  Multiply the number of servings by the number of  carbohydrates per serving.  The resulting number is the total amount of carbohydrates that you will be having.  Learning standard serving sizes of other foods When you eat foods containing carbohydrates that are not packaged or do not include "Nutrition Facts" on the label, you need to measure the servings in order to count the amount of carbohydrates:  Measure the foods that you will eat with a food scale or measuring cup, if needed.  Decide how many standard-size servings you will eat.  Multiply the number of servings by 15. Most carbohydrate-rich foods have about 15 g of carbohydrates per serving. ? For example, if you eat 8 oz (170 g) of strawberries, you will have eaten 2 servings and 30 g of carbohydrates (2 servings x 15 g = 30 g).  For foods that have more than one food mixed, such as soups and casseroles, you must count the carbohydrates in each food that is included.  The following list contains standard serving sizes of common carbohydrate-rich foods. Each of these servings has about 15 g of carbohydrates:   hamburger bun or  English muffin.   oz (15 mL) syrup.  oz (14 g) jelly.  1 slice of bread.  1 six-inch tortilla.  3 oz (85 g) cooked rice or pasta.  4 oz (113 g) cooked dried beans.  4 oz (113 g) starchy vegetable, such as peas, corn, or potatoes.  4 oz (113 g) hot cereal.  4 oz (113 g) mashed potatoes or  of a large baked potato.  4 oz (113 g) canned or frozen fruit.  4 oz (120 mL) fruit juice.  4-6 crackers.  6 chicken nuggets.  6 oz (170 g) unsweetened dry cereal.  6 oz (170 g) plain fat-free yogurt or yogurt sweetened with artificial sweeteners.  8 oz (240 mL) milk.  8 oz (170 g) fresh fruit or one small piece of fruit.  24 oz (680 g) popped popcorn.  Example of carbohydrate counting Sample meal  3 oz (85 g) chicken breast.  6 oz (170 g) brown rice.  4 oz (113 g) corn.  8 oz (240 mL) milk.  8 oz (170 g) strawberries with  sugar-free whipped topping. Carbohydrate calculation 1. Identify the foods that contain carbohydrates: ? Rice. ? Corn. ? Milk. ? Strawberries. 2. Calculate how many servings you have of each food: ? 2 servings rice. ? 1 serving corn. ? 1 serving milk. ? 1 serving strawberries. 3. Multiply each number of servings by 15 g: ? 2 servings rice x 15 g = 30 g. ? 1 serving corn x 15 g = 15 g. ? 1 serving milk x 15 g = 15 g. ? 1 serving strawberries x 15 g = 15 g. 4. Add together all of the amounts to find the total grams of carbohydrates eaten: ? 30 g + 15 g + 15 g + 15 g = 75 g of carbohydrates total. This information is not intended to replace advice given to you by your health care provider. Make sure you discuss any questions you have with your health care provider. Document Released: 07/30/2005 Document Revised: 02/17/2016 Document Reviewed: 01/11/2016 Elsevier Interactive Patient Education  2018 [employer]. Diabetes Mellitus and Food It is important for you to manage your blood sugar (glucose) level. Your blood glucose level can be greatly affected by what you eat. Eating healthier foods in the appropriate amounts throughout the day at about the same time each day will help you control your blood glucose level. It can also help slow or prevent worsening of your diabetes mellitus. Healthy eating may even help you improve the level of your blood pressure and reach or maintain a healthy weight. General recommendations for healthful eating and cooking habits include:  Eating meals and snacks regularly. Avoid going long periods of time without eating to lose weight.  Eating a diet that consists mainly of plant-based foods, such as fruits, vegetables, nuts, legumes, and whole grains.  Using low-heat cooking methods, such as baking, instead of high-heat cooking methods, such as deep frying.  Work with your dietitian to make sure you understand how to use the Nutrition Facts information on  food labels. How can food affect me? Carbohydrates Carbohydrates affect your blood glucose level more than any other type of food. Your dietitian will help you determine how many carbohydrates to eat at each meal and teach you how to count carbohydrates. Counting carbohydrates is important to keep your blood glucose at a healthy level, especially if you are using insulin or taking certain medicines for diabetes mellitus. Alcohol Alcohol can cause sudden decreases in blood glucose (hypoglycemia), especially if you use insulin or  take certain medicines for diabetes mellitus. Hypoglycemia can be a life-threatening condition. Symptoms of hypoglycemia (sleepiness, dizziness, and disorientation) are similar to symptoms of having too much alcohol. If your health care provider has given you approval to drink alcohol, do so in moderation and use the following guidelines:  Women should not have more than one drink per day, and men should not have more than two drinks per day. One drink is equal to: ? 12 oz of beer. ? 5 oz of wine. ? 1 oz of hard liquor.  Do not drink on an empty stomach.  Keep yourself hydrated. Have water, diet soda, or unsweetened iced tea.  Regular soda, juice, and other mixers might contain a lot of carbohydrates and should be counted.  What foods are not recommended? As you make food choices, it is important to remember that all foods are not the same. Some foods have fewer nutrients per serving than other foods, even though they might have the same number of calories or carbohydrates. It is difficult to get your body what it needs when you eat foods with fewer nutrients. Examples of foods that you should avoid that are high in calories and carbohydrates but low in nutrients include:  Trans fats (most processed foods list trans fats on the Nutrition Facts label).  Regular soda.  Juice.  Candy.  Sweets, such as cake, pie, doughnuts, and cookies.  Fried foods.  What foods  can I eat? Eat nutrient-rich foods, which will nourish your body and keep you healthy. The food you should eat also will depend on several factors, including:  The calories you need.  The medicines you take.  Your weight.  Your blood glucose level.  Your blood pressure level.  Your cholesterol level.  You should eat a variety of foods, including:  Protein. ? Lean cuts of meat. ? Proteins low in saturated fats, such as fish, egg whites, and beans. Avoid processed meats.  Fruits and vegetables. ? Fruits and vegetables that may help control blood glucose levels, such as apples, mangoes, and yams.  Dairy products. ? Choose fat-free or low-fat dairy products, such as milk, yogurt, and cheese.  Grains, bread, pasta, and rice. ? Choose whole grain products, such as multigrain bread, whole oats, and brown rice. These foods may help control blood pressure.  Fats. ? Foods containing healthful fats, such as nuts, avocado, olive oil, canola oil, and fish.  Does everyone with diabetes mellitus have the same meal plan? Because every person with diabetes mellitus is different, there is not one meal plan that works for everyone. It is very important that you meet with a dietitian who will help you create a meal plan that is just right for you. This information is not intended to replace advice given to you by your health care provider. Make sure you discuss any questions you have with your health care provider. Document Released: 04/26/2005 Document Revised: 01/05/2016 Document Reviewed: 06/26/2013 Elsevier Interactive Patient Education  2017 [employer].

## 2017-02-04 NOTE — Op Note (Signed)
NAMEMarland Kitchen  Sydney Morrow, Sydney Morrow NO.:  [id number]  MEDICAL RECORD NO.:  [id number]  LOCATION:                                 FACILITY:  PHYSICIAN:  Gae Bon, M.D.       DATE OF BIRTH:  DATE OF PROCEDURE:  02/01/2017 DATE OF DISCHARGE:                              OPERATIVE REPORT   PREOPERATIVE DIAGNOSES: 1. Nonrestorable teeth numbers 3, 5, 7, 8, 9, 10, 11, 12, 13, 14, 21,     22, 23, 24, 25, 26, 27, 28, 31. 2. Cyst, left maxilla. 3. Multiple dental abscesses. 4. Right facial cellulitis.  POSTOPERATIVE DIAGNOSES: 1. Nonrestorable teeth numbers 3, 5, 7, 8, 9, 10, 11, 12, 13, 14, 21,     22, 23, 24, 25, 26, 27, 28, 31. 2. Cyst, left maxilla. 3. Multiple dental abscesses. 4. Right facial cellulitis.  PROCEDURES: 1. Extraction of teeth numbers 3, 5, 7, 8, 9, 10, 11, 12, 13, 14, 21,     22, 23, 24, 25, 26, 27, 28, 31. 2. Alveoplasty, right and left maxilla and mandible. 3. Removal of cyst, left maxilla. 4. Incision and drainage of right mandibular abscess.  SURGEON:  Gae Bon, M.D.  ANESTHESIA:  General nasal intubation.  DESCRIPTION OF PROCEDURE:  The patient was taken to the operating room, placed on the table in supine position.  General anesthesia was administered intravenously and a nasal endotracheal tube was placed and secured.  The eyes were protected and the patient was draped for the procedure.  Time-out was performed.  The posterior pharynx was suctioned and a throat pack was placed.  Lidocaine 2% with 1:100,000 epinephrine was infiltrated in inferior alveolar block on the right and left side and in buccal and palatal infiltration of the maxilla, total of 20 mL was utilized.  A bite block was placed in the right side of the mouth and the left side was operated first.  A #15 blade was used to make an incision distal to tooth #21, carrying forward to tooth #28 in the gingival sulcus on the buccal and lingual aspects of these areas.   The periosteum was reflected with a periosteal elevator.  The teeth were elevated with a 301 elevator and removed from the mouth with the Asch forceps and the rongeurs.  The sockets were curetted.  The tissue was trimmed and then exposed buccally and lingually to expose the alveolar crest.  Alveoplasty was performed using egg-shaped burr and then a bone file.  The area was irrigated and closed with 3-0 chromic.  A 15 blade was then used to make an incision around teeth numbers 14, 13, 12, 11, 10, 9, 8, 7 in the maxilla on the buccal and palatal aspects of the gingival sulcus.  Periosteum was reflected buccally and palatally.  The teeth were elevated with a 301 elevator and removed from the mouth with a dental forceps and rongeurs.  The cystic lesion in the area of tooth #11 was enucleated using a Soil scientist and 15 blade.  This was removed and submitted for pathological examination in formalin.  Then, the periosteum was reflected to expose the alveolar crest.  An alveoplasty was performed  using the egg-shaped burr and bone file.  The area was irrigated and closed with 3-0 chromic.  A larger bite block was placed on the left side of the mouth and a Sweetheart retractor was used to retract the tongue.  A 15 blade was used to incise into the crestal tissue overlying the alveolar crest on the lower right side, beginning at tooth #31, carrying forward to tooth #28.  There was some fluctuance in the buccal vestibule and a 15 blade was used to make an incision and purulent exudate was expressed.  Cultures were submitted aerobic and anaerobic for this.  Then, the periosteum was reflected from around these teeth and then the teeth were elevated and removed with the forceps and rongeurs.  The sockets were irrigated.  Periosteum was reflected to expose the alveolar crest and then alveoplasty was performed.  The area was irrigated and closed with 3-0 chromic in the right maxilla.  Incision was  made around teeth numbers 3 and 5 and carried forward to the anterior incision.  Periosteum was reflected. Tooth #5 was removed using the upper forceps.  Tooth #3 required sectioning prior to removal and this tooth was removed in pieces using the rongeur.  Then, the egg-shaped burr and bone file were used to perform the alveoplasty.  The area was irrigated and closed with 3-0 chromic.  Then, the oral cavity was irrigated and suctioned.  Throat pack was removed.  The patient was left in care of anesthesia for extubation and transportation to the recovery room.  The patient was scheduled to go back to the floor.  ESTIMATED BLOOD LOSS:  Minimal.  COMPLICATIONS:  None.  SPECIMEN:  Cyst, left maxilla.  COUNTS:  Correct.     Gae Bon, M.D.   ______________________________ Gae Bon, M.D.    SMJ/MEDQ  D:  02/01/2017  T:  02/02/2017  Job:  [id number]

## 2017-02-04 NOTE — Progress Notes (Signed)
Lind Guest Schaad to be D/C'd Home per MD order.  Discussed with the patient and all questions fully answered.  VSS, Skin clean, dry and intact without evidence of skin break down, no evidence of skin tears noted. IV catheter discontinued intact. Site without signs and symptoms of complications. Dressing and pressure applied.  An After Visit Summary was printed and given to the patient. Patient received prescription.  D/c education completed with patient/family including follow up instructions, medication list, d/c activities limitations if indicated, with other d/c instructions as indicated by MD - patient able to verbalize understanding, all questions fully answered.   Patient instructed to return to ED, call 911, or call MD for any changes in condition.   Patient escorted via [place], and D/C home via private auto.  Luci Bank 02/04/2017 11:47 AM

## 2017-02-05 LAB — AEROBIC/ANAEROBIC CULTURE (SURGICAL/DEEP WOUND): CULTURE: NORMAL

## 2017-02-05 LAB — AEROBIC/ANAEROBIC CULTURE W GRAM STAIN (SURGICAL/DEEP WOUND)

## 2017-02-06 NOTE — Discharge Summary (Signed)
[facility] Discharge Summary   Patient: Sydney Morrow [id number]:[id number]   PCP: [facility] DOB: [birth year]   Date of admission: 01/29/2017   Date of discharge: 02/04/2017     Discharge Diagnoses:  Principal Problem:   Odontogenic infection of jaw Active Problems:   Elevated glucose   Admitted From: home Disposition:  home  Recommendations for Outpatient Follow-up:  1. Please follow up with PCP in 1 week   [place], [facility]. Schedule an appointment as soon as possible for a visit in 1 week(s).   Contact information: [place] [address] [phone]        [facility]. Call on 02/08/2017.   Why:  Please call on Friday, 02/08/2017 at 8:30 am  to arranged post hospital follow up appointment Contact information: [place] [phone]       [place], [place], [place]. Schedule an appointment as soon as possible for a visit in 1 month(s).   Specialty:  Oral Surgery Contact information: [place] [phone]          Diet recommendation: diabetic diet  Activity: The patient is advised to gradually reintroduce usual activities.  Discharge Condition: good  Code Status: full code  History of present illness: As per the H and P dictated on admission, "RACQUEL ARKIN is a 56 y.o. female with history of hypertension and hyperlipidemia who comes into the hospital with a 24-hour history of pain and edema to the right side of her face. She has very poor dentition and states she has not seen a dentist in many years, possibly since she was a child. She is afebrile, has a slight leukocytosis of 10.7, a maxillofacial CT shows a soft tissue infection of the right face that appears to be odontogenic in origin with a small periosteal abscess along the lateral aspect of the roots of the residual right mandible  molar. Moderate to severe inflammation affecting the right masticator space, portions of the right submandibular and anterior parotid spaces, and superficial soft tissues of the right face, no other soft tissue abscess identified. There is also reactive right cervical lymphadenopathy. EDP has discussed with oral surgery on-call, Dr. Diona Browner, decision has been made to transfer to [facility] in [place] for continued IV antibiotic therapy as well as potential oral surgery. Admission requested."  Hospital Course:  Summary of her active problems in the hospital is as following. 1. Cellulitis of the face. Mandibular abscess. Primary odontogenic, oral surgeon consulted. Appreciate input. Underwent multiple teeth extraction as well as incision and drainage of the abscess. Tolerating full liquid diet, I would advance to dysphagia 1 diet Multiple organisms growing currently. Since the patient is clinically getting better clindamycin I would continue and follow the results of the culture.  2. New-onset type 2 diabetes mellitus. Patient mentions that she follows up with PCP on a regular basis and has not been told that she has diabetes. A1c 9.7. Patient would like to go home on oral medication. We provided metformin as well as Amaryl.  3. Essential hypertension Blood pressure elevated. Resuming oral blood pressure medications.  4. Dyslipidemia. Resume statin.  All other chronic medical condition were stable during the hospitalization.  Patient was ambulatory without any assistance. On the day of the discharge the patient's vitals were stable, and no other acute medical condition were reported by patient. the patient was  felt safe to be discharge at home with family.  Procedures and Results:  EXTRACTIONSteeth # 3, 5, 7, 8, 9, 10, 11, 12, 13, 14, 21, 22, 23, 24, 25, 26, 27, 28, 45, Alveoloplasty, removal cyst left maxilla, incision and drainage right mandibular abscess    Consultations:  Oral surgery   DISCHARGE MEDICATION: Discharge Medication List as of 02/04/2017 12:24 PM    START taking these medications   Details  blood glucose meter kit and supplies KIT Dispense based on patient and insurance preference. Use up to four times daily as directed. (FOR ICD-9 250.00, 250.01)., Print    oxyCODONE-acetaminophen (PERCOCET/ROXICET) 5-325 MG tablet Take 1-2 tablets by mouth every 8 (eight) hours as needed for moderate pain., Starting Mon 02/04/2017, Print    saccharomyces boulardii (FLORASTOR) 250 MG capsule Take 1 capsule (250 mg total) by mouth 2 (two) times daily., Starting Mon 02/04/2017, Normal    sodium bicarbonate/sodium chloride SOLN 1 application by Mouth Rinse route as needed for mouth pain., Starting Mon 02/04/2017, Print      CONTINUE these medications which have CHANGED   Details  clindamycin (CLEOCIN) 300 MG capsule Take 2 capsules (600 mg total) by mouth 3 (three) times daily., Starting Mon 02/04/2017, Until Thu 02/07/2017, Normal    famotidine (PEPCID) 20 MG tablet Take 1 tablet (20 mg total) by mouth daily., Starting Mon 02/04/2017, Normal    glimepiride (AMARYL) 2 MG tablet Take 1 tablet (2 mg total) by mouth daily with breakfast., Starting Mon 02/04/2017, Normal    losartan (COZAAR) 100 MG tablet Take 1 tablet (100 mg total) by mouth daily., Starting Mon 02/04/2017, Normal    metFORMIN (GLUCOPHAGE) 500 MG tablet Take 1 tablet (500 mg total) by mouth 2 (two) times daily with a meal., Starting Mon 02/04/2017, Normal    polyethylene glycol (MIRALAX / GLYCOLAX) packet Take 17 g by mouth daily as needed., Starting Mon 02/04/2017, Normal      CONTINUE these medications which have NOT CHANGED   Details  fluticasone (FLONASE) 50 MCG/ACT nasal spray Place 1 spray into both nostrils 2 (two) times daily., Historical Med    loratadine (CLARITIN) 10 MG tablet Take 10 mg by mouth daily., Historical Med    lovastatin (MEVACOR) 20 MG tablet Take 20 mg  by mouth at bedtime., Historical Med    metoprolol tartrate (LOPRESSOR) 50 MG tablet Take 50 mg by mouth 2 (two) times daily., Historical Med      STOP taking these medications     ranitidine (ZANTAC) 150 MG tablet        Allergies  Allergen Reactions  . Aspirin   . Penicillins     unknown   Discharge Instructions    Diet - low sodium heart healthy    Complete by:  As directed    Diet Carb Modified    Complete by:  As directed    Discharge instructions    Complete by:  As directed    It is important that you read following instructions as well as go over your medication list with RN to help you understand your care after this hospitalization.  Discharge Instructions: Please follow-up with PCP in one week  Please request your primary care physician to go over all Hospital Tests and Procedure/Radiological results at the follow up,  Please get all Hospital records sent to your PCP by signing hospital release before you go home.   Do not drive, operating heavy machinery, perform activities at heights, swimming or participation in water  activities or provide baby sitting services while you are on Pain, Sleep and Anxiety Medications; until you have been seen by Primary Care Physician or a Neurologist and advised to do so again. Do not take more than prescribed Pain, Sleep and Anxiety Medications. You were cared for by a hospitalist during your hospital stay. If you have any questions about your discharge medications or the care you received while you were in the hospital after you are discharged, you can call the unit and ask to speak with the hospitalist on call if the hospitalist that took care of you is not available.  Once you are discharged, your primary care physician will handle any further medical issues. Please note that NO REFILLS for any discharge medications will be authorized once you are discharged, as it is imperative that you return to your primary care physician (or  establish a relationship with a primary care physician if you do not have one) for your aftercare needs so that they can reassess your need for medications and monitor your lab values. You Must read complete instructions/literature along with all the possible adverse reactions/side effects for all the Medicines you take and that have been prescribed to you. Take any new Medicines after you have completely understood and accept all the possible adverse reactions/side effects. Wear Seat belts while driving. If you have smoked or chewed Tobacco in the last 2 yrs please stop smoking and/or stop any Recreational drug use.   Increase activity slowly    Complete by:  As directed      Discharge Exam: Filed Weights   01/29/17 1105 01/29/17 1819  Weight: 83.5 kg (184 lb) 84.1 kg (185 lb 6.4 oz)   Vitals:   02/04/17 0545 02/04/17 0923  BP: (!) 150/85 (!) 175/61  Pulse: 60 65  Resp: 17   Temp: 98.3 F (36.8 C)    General: Appear in no distress, no Rash; Oral Mucosa moist. Cardiovascular: S1 and S2 Present, no Murmur, no JVD Respiratory: Bilateral Air entry present and Clear to Auscultation, no Crackles, no wheezes Abdomen: Bowel Sound present, Soft and no tenderness Extremities: no Pedal edema, no calf tenderness Neurology: Grossly no focal neuro deficit.  The results of significant diagnostics from this hospitalization (including imaging, microbiology, ancillary and laboratory) are listed below for reference.    Significant Diagnostic Studies: Dg Orthopantogram  Result Date: 01/30/2017 CLINICAL DATA:  Cellulitis and abscess of oral soft tissues since yesterday, right worse than left, no injury. EXAM: ORTHOPANTOGRAM/PANORAMIC COMPARISON:  Maxillofacial CT 01/29/2017. FINDINGS: Midline artifact typical of Panorex technique. Multiple teeth are absent. As seen on CT, there is poor dentition with multiple dental caries and periapical lucencies. Largest lucency involves the left maxillary incisor. No  evidence of acute fracture, dislocation or bone destruction. IMPRESSION: Stable radiographic appearance compared with recent CT. Poor dentition with multiple dental caries and periapical abscesses. Electronically Signed   By: Richardean Sale M.D.   On: 01/30/2017 16:54   Ct Maxillofacial W Contrast  Result Date: 01/29/2017 CLINICAL DATA:  56 year old female with right facial swelling, difficulty swallowing this morning. No known injury. CONTRAST:  75 mL Isovue-300 EXAM: CT MAXILLOFACIAL WITH CONTRAST TECHNIQUE: Multidetector CT imaging of the maxillofacial structures was performed with IV contrast. Multiplanar CT image reconstructions were also generated. A small metallic BB was placed on the right temple in order to reliably differentiate right from left. COMPARISON:  None. FINDINGS: Osseous: Poor dentition. Dental caries and periapical lucency along the residual right mandible molar with what  appears to be a small area of lateral cortical breakthrough (series 7, image 28 and series 3, image 57) with a small 3 x 9 mm adjacent periosteal abscess (series 2, images 58 and 59). Multiple maxillary dentition periapical lucencies, including a large 18 mm left incisor region maxilla cyst (series 2, image 53 and series 7, image 13). No superimposed acute facial fracture.  Visible calvarium intact. Orbits: Bilateral orbits soft tissues are within normal limits. Orbital walls are intact. Sinuses: Clear aside from mild mucosal thickening in the left maxillary alveolar recess. Bilateral tympanic cavities, mastoids, and petrous apex air cells are clear. Soft tissues: Negative visualized larynx, pharynx, parapharyngeal spaces, retropharyngeal space and sublingual space. Left submandibular and parotid spaces are normal. Along the posterior body of the right mandible there is asymmetric soft tissue thickening and stranding which continues superficially to the right subcutaneous soft tissues along the right jaw. Small periosteal  abscess noted as detailed above. There is asymmetric thickening of the right platysma. There is moderate asymmetric enlargement of the soft tissues of the right masticator space, including the right masseter muscle which appears heterogeneous and edematous (series 2, image 55). There is some secondary involvement of the right submandibular space and anterior right parotid space. There are reactive appearing right level Ib and right level IIa lymph nodes individually measuring up to 9 mm short axis. No cystic or necrotic nodes. Limited intracranial: Negative visualized brain parenchyma. Major vascular structures in the neck and at the skullbase are patent, including the right IJ, and the cavernous sinus. IMPRESSION: 1. Multi-spatial soft tissue infection of the right face appears to be odontogenic in origin. There is a very small periosteal abscess along the lateral aspect of the roots of the residual right mandible molar. 2. There is associated moderate to severe inflammation affecting the right masticator space, portions of the right submandibular and anterior parotid spaces, and superficial soft tissues of the right face. No other soft tissue abscess identified. 3. Reactive right cervical lymphadenopathy.  No suppurative nodes. 4. Diffuse poor dentition. Prominent 18 mm probably chronic postinflammatory cyst of the left maxilla alveolar process. Electronically Signed   By: Genevie Ann M.D.   On: 01/29/2017 13:15    Microbiology: Recent Results (from the past 240 hour(s))  Surgical pcr screen     Status: None   Collection Time: 02/01/17 10:39 AM  Result Value Ref Range Status   MRSA, PCR NEGATIVE NEGATIVE Final   Staphylococcus aureus NEGATIVE NEGATIVE Final    Comment:        The Xpert SA Assay (FDA approved for NASAL specimens in patients over 36 years of age), is one component of a comprehensive surveillance program.  Test performance has been validated by [facility] for patients greater than or  equal to 43 year old. It is not intended to diagnose infection nor to guide or monitor treatment.   Aerobic/Anaerobic Culture (surgical/deep wound)     Status: None   Collection Time: 02/01/17  1:22 PM  Result Value Ref Range Status   Specimen Description ABSCESS  Final   Special Requests ORAL ABSCESS  Final   Gram Stain   Final    MODERATE WBC PRESENT, PREDOMINANTLY PMN FEW SQUAMOUS EPITHELIAL CELLS PRESENT MODERATE GRAM POSITIVE COCCI IN PAIRS FEW GRAM POSITIVE RODS FEW GRAM NEGATIVE RODS    Culture   Final    NORMAL OROPHARYNGEAL FLORA FEW PREVOTELLA MELANINOGENICA BETA LACTAMASE POSITIVE    Report Status 02/05/2017 FINAL  Final  Labs: CBC:  Recent Labs Lab 01/31/17 0552 02/01/17 0437 02/02/17 0427 02/03/17 0424  WBC 6.4 5.7 9.4 6.0  NEUTROABS  --   --   --  2.7  HGB 12.1 11.3* 12.4 11.3*  HCT 37.5 34.9* 38.0 36.4  MCV 94.5 92.8 92.0 95.3  PLT 200 247 316 025   Basic Metabolic Panel:  Recent Labs Lab 01/31/17 0552 02/01/17 0437 02/02/17 0427 02/02/17 1208 02/03/17 0424 02/04/17 0555  NA 138 138 139 139 138 140  K 4.0 4.1 3.9 4.0 4.3 4.3  CL 108 110 105 107 106 110  CO2 22 22 17* 22 22 22   GLUCOSE 190* 177* 154* 171* 168* 97  BUN 12 9 12 12 14 10   CREATININE 0.93 1.04* 1.05* 1.13* 1.17* 0.95  CALCIUM 8.5* 8.7* 8.8* 8.9 8.8* 8.8*  MG 1.8  --  1.9  --  2.1  --    Liver Function Tests: No results for input(s): AST, ALT, ALKPHOS, BILITOT, PROT, ALBUMIN in the last 168 hours. No results for input(s): LIPASE, AMYLASE in the last 168 hours. No results for input(s): AMMONIA in the last 168 hours. Cardiac Enzymes: No results for input(s): CKTOTAL, CKMB, CKMBINDEX, TROPONINI in the last 168 hours. BNP (last 3 results) No results for input(s): BNP in the last 8760 hours. CBG:  Recent Labs Lab 02/03/17 0844 02/03/17 1156 02/03/17 1748 02/03/17 2239 02/04/17 0812  GLUCAP 116* 144* 119* 134* 123*   Time spent: 35 minutes  Signed:  Tyrann Donaho,  Josette Shimabukuro  [facility] 02/04/2017   , 11:15 PM

## 2017-02-19 ENCOUNTER — Inpatient Hospital Stay: Payer: PRIVATE HEALTH INSURANCE

## 2017-03-05 ENCOUNTER — Ambulatory Visit: Payer: Self-pay | Attending: Internal Medicine | Admitting: Physician Assistant

## 2017-03-05 VITALS — BP 169/99 | HR 71 | Temp 97.5°F | Ht 67.0 in | Wt 183.0 lb

## 2017-03-05 DIAGNOSIS — E785 Hyperlipidemia, unspecified: Secondary | ICD-10-CM | POA: Insufficient documentation

## 2017-03-05 DIAGNOSIS — E1165 Type 2 diabetes mellitus with hyperglycemia: Secondary | ICD-10-CM | POA: Insufficient documentation

## 2017-03-05 DIAGNOSIS — Z7984 Long term (current) use of oral hypoglycemic drugs: Secondary | ICD-10-CM | POA: Insufficient documentation

## 2017-03-05 DIAGNOSIS — Z5189 Encounter for other specified aftercare: Secondary | ICD-10-CM | POA: Insufficient documentation

## 2017-03-05 DIAGNOSIS — Z79891 Long term (current) use of opiate analgesic: Secondary | ICD-10-CM | POA: Insufficient documentation

## 2017-03-05 DIAGNOSIS — E119 Type 2 diabetes mellitus without complications: Secondary | ICD-10-CM

## 2017-03-05 DIAGNOSIS — K047 Periapical abscess without sinus: Secondary | ICD-10-CM | POA: Insufficient documentation

## 2017-03-05 DIAGNOSIS — Z79899 Other long term (current) drug therapy: Secondary | ICD-10-CM | POA: Insufficient documentation

## 2017-03-05 DIAGNOSIS — I10 Essential (primary) hypertension: Secondary | ICD-10-CM | POA: Insufficient documentation

## 2017-03-05 DIAGNOSIS — Z9889 Other specified postprocedural states: Secondary | ICD-10-CM | POA: Insufficient documentation

## 2017-03-05 DIAGNOSIS — K219 Gastro-esophageal reflux disease without esophagitis: Secondary | ICD-10-CM | POA: Insufficient documentation

## 2017-03-05 DIAGNOSIS — Z9851 Tubal ligation status: Secondary | ICD-10-CM | POA: Insufficient documentation

## 2017-03-05 LAB — POCT CBG (FASTING - GLUCOSE)-MANUAL ENTRY: GLUCOSE FASTING, POC: 129 mg/dL — AB (ref 70–99)

## 2017-03-05 MED ORDER — TRAMADOL HCL 50 MG PO TABS: 50.0000 mg | ORAL_TABLET | Freq: Three times a day (TID) | ORAL | 0 refills | Status: DC | PRN

## 2017-03-05 MED ORDER — GLUCOSE BLOOD VI STRP: ORAL_STRIP | 12 refills | Status: DC

## 2017-03-05 MED ORDER — TRUE METRIX METER DEVI: 1.0000 | Freq: Four times a day (QID) | 0 refills | Status: DC

## 2017-03-05 MED ORDER — METOPROLOL TARTRATE 50 MG PO TABS: 50.0000 mg | ORAL_TABLET | Freq: Two times a day (BID) | ORAL | 3 refills | Status: DC

## 2017-03-05 MED ORDER — TRUEPLUS LANCETS 28G MISC: 28.0000 g | Freq: Four times a day (QID) | 5 refills | Status: DC

## 2017-03-05 MED FILL — traMADol HCL 50 MG TABS: 50 | 10 days supply | Qty: 30 | Fill #0

## 2017-03-05 MED FILL — TRUE METRIX BLOOD GLUCOSE M: W/DEVICE | 365 days supply | Qty: 1 | Fill #0

## 2017-03-05 MED FILL — TRUE METRIX TEST STRIP: 25 days supply | Qty: 100 | Fill #0

## 2017-03-05 MED FILL — TRUEplus LANCETS 28G MISC: 25 days supply | Qty: 100 | Fill #0

## 2017-03-05 NOTE — Progress Notes (Signed)
Sydney Morrow  TUU:[id number]  PHX:[id number]  DOB - 1961/06/05  Chief Complaint  Patient presents with  . Hospitalization Follow-up       Subjective:   Sydney Morrow is a 56 y.o. female here today for establishment of care. She has a history of gastroesophageal reflux disease, hypertension, hyperlipidemia, and poor dentition. She smokes. She presented to the hospital 01/29/2017 with right facial pain and swelling. Mostly located to the lower jaw on her T4 causing pain. Her blood pressure was very elevated at 189/195. She was tachycardic on presentation as well. Her glucose was 266 and she had never been told that she was diabetic. Her white blood cell count was 10.7. Her creatinine was normal. A CT scan of the maxillofacial area showed soft tissue infection and she was admitted by the internal medicine team. The oral surgeon consult it and ultimately she had multiple tooth extractions on 02/01/17 and in incision and drainage of an abscess.  Her hospital course was completed by hyperglycemia with an A1c of 9.7%. She was started on oral agents. She also has some diabetes education. Otherwise her course has been uncomplicated.  She did get her medications field at discharge. She has completed her course of antibiotics. She does not have a blood sugar meter but has been using her daughter's and checking her sugars sporadically. She states her numbers have been somewhere between 95 and 145 on most occasions. Trying to eat a little bit better. No chest pain. Breathing doing okay urinating okay. No shortness of breath. Teeth still cause pain. She has had follow-up with the oral surgeon who has released her. No new complaints.   ROS: GEN: denies fever or chills, denies change in weight Skin: denies lesions or rashes HEENT: denies headache, earache, epistaxis, sore throat, or neck pain +dental pain LUNGS: denies SHOB, dyspnea, PND, orthopnea CV: denies CP or palpitations ABD: denies abd pain, N  or V EXT: denies muscle spasms or swelling; no pain in lower ext, no weakness NEURO: denies numbness or tingling, denies sz, stroke or TIA  ALLERGIES: Allergies  Allergen Reactions  . Aspirin   . Penicillins     unknown    PAST MEDICAL HISTORY: Past Medical History:  Diagnosis Date  . Acid reflux   . History of anemia   . Hypertension   . Jaw inflammation, right 01/30/2017    PAST SURGICAL HISTORY: Past Surgical History:  Procedure Laterality Date  . DENTAL SURGERY    . TOOTH EXTRACTION N/A 02/01/2017   Procedure: FULL DENTAL EXTRACTIONS;  Surgeon: Diona Browner, DDS;  Location: [place];  Service: Oral Surgery;  Laterality: N/A;  . TUBAL LIGATION      MEDICATIONS AT HOME: Prior to Admission medications   Medication Sig Start Date End Date Taking? Authorizing Provider  famotidine (PEPCID) 20 MG tablet Take 1 tablet (20 mg total) by mouth daily. 02/04/17  Yes Lavina Hamman, MD  fluticasone (FLONASE) 50 MCG/ACT nasal spray Place 1 spray into both nostrils 2 (two) times daily.   Yes [provider]  glimepiride (AMARYL) 2 MG tablet Take 1 tablet (2 mg total) by mouth daily with breakfast. 02/04/17  Yes Lavina Hamman, MD  loratadine (CLARITIN) 10 MG tablet Take 10 mg by mouth daily.   Yes [provider]  losartan (COZAAR) 100 MG tablet Take 1 tablet (100 mg total) by mouth daily. 02/04/17  Yes Lavina Hamman, MD  lovastatin (MEVACOR) 20 MG tablet Take 20 mg by mouth at bedtime.  Yes [provider]  metFORMIN (GLUCOPHAGE) 500 MG tablet Take 1 tablet (500 mg total) by mouth 2 (two) times daily with a meal. 02/04/17  Yes Lavina Hamman, MD  metoprolol tartrate (LOPRESSOR) 50 MG tablet Take 1 tablet (50 mg total) by mouth 2 (two) times daily. 03/05/17  Yes Ena Dawley, [place] Oborn S, PA-C  polyethylene glycol (MIRALAX / GLYCOLAX) packet Take 17 g by mouth daily as needed. 02/04/17  Yes Lavina Hamman, MD  Blood Glucose Monitoring Suppl (TRUE METRIX METER) DEVI 1 kit  by Does not apply route 4 (four) times daily. 03/05/17   Brayton Caves, PA-C  glucose blood (TRUE METRIX BLOOD GLUCOSE TEST) test strip Use as instructed 03/05/17   Brayton Caves, PA-C  sodium bicarbonate/sodium chloride SOLN 1 application by Mouth Rinse route as needed for mouth pain. Patient not taking: Reported on 03/05/2017 02/04/17   Lavina Hamman, MD  traMADol (ULTRAM) 50 MG tablet Take 1 tablet (50 mg total) by mouth every 8 (eight) hours as needed. 03/05/17   Brayton Caves, PA-C  TRUEPLUS LANCETS 28G MISC 28 g by Does not apply route 4 (four) times daily. 03/05/17   Brayton Caves, PA-C   Family-diabetes  Social-unmarried, kids, smokes  Objective:   Vitals:   03/05/17 0915  BP: (!) 169/99  Pulse: 71  Temp: (!) 97.5 F (36.4 C)  TempSrc: Oral  SpO2: 97%  Weight: 183 lb (83 kg)  Height: 5' 7" (1.702 m)    Exam General appearance : Awake, alert, not in any distress. Speech Clear. Not toxic looking HEENT: Atraumatic and Normocephalic, pupils equally reactive to light and accomodation Neck: supple, no JVD. No cervical lymphadenopathy.  Chest:Good air entry bilaterally, no added sounds  CVS: S1 S2 regular, no murmurs.  Abdomen: Bowel sounds present, Non tender and not distended with no guarding, rigidity or rebound. Extremities: B/L Lower Ext shows no edema, both legs are warm to touch Neurology: Awake alert, and oriented X 3, CN II-XII intact, Non focal Skin:No Rash Wounds:N/A  Data Review Lab Results  Component Value Date   HGBA1C 9.7 (H) 01/29/2017     Assessment & Plan  1. Dental abscess/poor dentition  -antibiotics completed  -tramadol for pain  -will need dentures once mouth heals 2. HTN   -DASH diet   -cont BB and ARB  -keep a log and bring to next appt 3. Newly diagnosed DM type 2  -meter kit etc  -keep a log and bing to next appt  -education provided 4. Smoker  -cessation discussed->not ready to quit  5. HL  -cont statin  Return in about 4  weeks (around 04/02/2017).  The patient was given clear instructions to go to ER or return to medical center if symptoms don't improve, worsen or new problems develop. The patient verbalized understanding. The patient was told to call to get lab results if they haven't heard anything in the next week.   Total time spent with patient was 28 min. Greater than 50 % of this visit was spent face to face counseling and coordinating care regarding risk factor modification, compliance importance and encouragement, education related to high blood pressure and diabetes.  This note has been created with Surveyor, quantity. Any transcriptional errors are unintentional.    Zettie Pho, PA-C [facility] [place], [place]   03/05/2017, 9:46 AM

## 2017-03-05 NOTE — Progress Notes (Signed)
Follow up with dental pain

## 2017-03-05 NOTE — Patient Instructions (Signed)
Aim for 30 minutes of exercise most days. Rethink what you drink. Water is great! Aim for 2-3 Carb Choices per meal (30-45 grams) +/- 1 either way  Aim for 0-15 Carbs per snack if hungry  Include protein in moderation with your meals and snacks  Consider reading food labels for Total Carbohydrate and Fat Grams of foods  Consider checking BG at alternate times per day  Continue taking medication as directed Be mindful about how much sugar you are adding to beverages and other foods. Fruit Punch - find one with no sugar  Measure and decrease portions of carbohydrate foods  Make your plate and don't go back for seconds  Keep a log of your blood sugar and bring to next appointment  Check your blood pressure two times per week and bring numbers to next appointment  Stop smoking!!

## 2017-03-13 ENCOUNTER — Inpatient Hospital Stay (INDEPENDENT_AMBULATORY_CARE_PROVIDER_SITE_OTHER): Payer: PRIVATE HEALTH INSURANCE | Admitting: Physician Assistant

## 2017-04-09 ENCOUNTER — Encounter: Payer: Self-pay | Admitting: Family Medicine

## 2017-04-09 ENCOUNTER — Ambulatory Visit: Payer: Self-pay | Attending: Family Medicine | Admitting: Family Medicine

## 2017-04-09 VITALS — BP 180/70 | HR 68 | Temp 98.7°F | Resp 18 | Ht 67.0 in | Wt 184.6 lb

## 2017-04-09 DIAGNOSIS — Z79899 Other long term (current) drug therapy: Secondary | ICD-10-CM | POA: Insufficient documentation

## 2017-04-09 DIAGNOSIS — D649 Anemia, unspecified: Secondary | ICD-10-CM | POA: Insufficient documentation

## 2017-04-09 DIAGNOSIS — I10 Essential (primary) hypertension: Secondary | ICD-10-CM | POA: Insufficient documentation

## 2017-04-09 DIAGNOSIS — Z862 Personal history of diseases of the blood and blood-forming organs and certain disorders involving the immune mechanism: Secondary | ICD-10-CM

## 2017-04-09 DIAGNOSIS — R1030 Lower abdominal pain, unspecified: Secondary | ICD-10-CM | POA: Insufficient documentation

## 2017-04-09 DIAGNOSIS — R110 Nausea: Secondary | ICD-10-CM | POA: Insufficient documentation

## 2017-04-09 DIAGNOSIS — K2100 Gastro-esophageal reflux disease with esophagitis, without bleeding: Secondary | ICD-10-CM

## 2017-04-09 DIAGNOSIS — Z7984 Long term (current) use of oral hypoglycemic drugs: Secondary | ICD-10-CM | POA: Insufficient documentation

## 2017-04-09 DIAGNOSIS — E119 Type 2 diabetes mellitus without complications: Secondary | ICD-10-CM

## 2017-04-09 DIAGNOSIS — Z13220 Encounter for screening for lipoid disorders: Secondary | ICD-10-CM

## 2017-04-09 DIAGNOSIS — E1165 Type 2 diabetes mellitus with hyperglycemia: Secondary | ICD-10-CM | POA: Insufficient documentation

## 2017-04-09 DIAGNOSIS — R7309 Other abnormal glucose: Secondary | ICD-10-CM

## 2017-04-09 LAB — POCT URINALYSIS DIPSTICK
Bilirubin, UA: NEGATIVE
Glucose, UA: NEGATIVE
Ketones, UA: NEGATIVE
LEUKOCYTES UA: NEGATIVE
NITRITE UA: NEGATIVE
PH UA: 5.5 (ref 5.0–8.0)
Spec Grav, UA: 1.02 (ref 1.010–1.025)
UROBILINOGEN UA: 0.2 U/dL

## 2017-04-09 LAB — GLUCOSE, POCT (MANUAL RESULT ENTRY): POC GLUCOSE: 154 mg/dL — AB (ref 70–99)

## 2017-04-09 MED ORDER — CLONIDINE HCL 0.2 MG PO TABS: 0.2000 mg | ORAL_TABLET | Freq: Once | ORAL | Status: DC

## 2017-04-09 MED ORDER — GLIMEPIRIDE 2 MG PO TABS: 2.0000 mg | ORAL_TABLET | Freq: Every day | ORAL | 0 refills | Status: DC

## 2017-04-09 MED ORDER — LOSARTAN POTASSIUM 100 MG PO TABS: 100.0000 mg | ORAL_TABLET | Freq: Every day | ORAL | 0 refills | Status: DC

## 2017-04-09 MED ORDER — AMLODIPINE BESYLATE 10 MG PO TABS: 10.0000 mg | ORAL_TABLET | Freq: Every day | ORAL | 2 refills | Status: DC

## 2017-04-09 MED ORDER — METFORMIN HCL 500 MG PO TABS: 500.0000 mg | ORAL_TABLET | Freq: Two times a day (BID) | ORAL | 3 refills | Status: DC

## 2017-04-09 MED ORDER — PANTOPRAZOLE SODIUM 20 MG PO TBEC: 20.0000 mg | DELAYED_RELEASE_TABLET | Freq: Every day | ORAL | 0 refills | Status: DC

## 2017-04-09 MED ORDER — METOPROLOL TARTRATE 50 MG PO TABS: 50.0000 mg | ORAL_TABLET | Freq: Two times a day (BID) | ORAL | 3 refills | Status: DC

## 2017-04-09 NOTE — Progress Notes (Signed)
Patient is here for f/up HTN DM

## 2017-04-09 NOTE — Progress Notes (Signed)
Subjective:  Patient ID: Sydney Morrow, female    DOB: 1960/12/21  Age: 56 y.o. MRN: [id number]  CC: Hypertension and Diabetes   HPI Sydney Morrow presents to establish care.History of hypertension. She is not exercising and is not adherent to low salt diet. She does not check BP  at home. She reports  Cardiac symptoms none. Patient denies chest pain, chest pressure/discomfort, claudication, dyspnea, near-syncope, palpitations and syncope.  Cardiovascular risk factors: dyslipidemia, hypertension and sedentary lifestyle. Use of agents associated with hypertension: none. History of target organ damage: none. She reports abdominal pain with cramping and nausea for 2 to 3 weeks. She denies any hematochezia, melena, or dysuria. She reports history of bleeding ulcers with last endoscopy/colonoscopy in 2011. History of DM.  Symptoms: none. Patient denies foot ulcerations, nausea, paresthesia of the feet, polydipsia, polyuria, visual disturbances and vomitting.  Evaluation to date has been included: fasting lipid panel and hemoglobin A1C.  Home sugars: patient does not check sugars. Treatment to date: metformin and glimepiride.     Outpatient Medications Prior to Visit  Medication Sig Dispense Refill  . Blood Glucose Monitoring Suppl (TRUE METRIX METER) DEVI 1 kit by Does not apply route 4 (four) times daily. 1 Device 0  . fluticasone (FLONASE) 50 MCG/ACT nasal spray Place 1 spray into both nostrils 2 (two) times daily.    Marland Kitchen glucose blood (TRUE METRIX BLOOD GLUCOSE TEST) test strip Use as instructed 100 each 12  . loratadine (CLARITIN) 10 MG tablet Take 10 mg by mouth daily.    . polyethylene glycol (MIRALAX / GLYCOLAX) packet Take 17 g by mouth daily as needed. 14 each 0  . sodium bicarbonate/sodium chloride SOLN 1 application by Mouth Rinse route as needed for mouth pain. (Patient not taking: Reported on 03/05/2017) 500 mL 0  . traMADol (ULTRAM) 50 MG tablet Take 1 tablet (50 mg total) by mouth  every 8 (eight) hours as needed. 30 tablet 0  . TRUEPLUS LANCETS 28G MISC 28 g by Does not apply route 4 (four) times daily. 120 each 5  . famotidine (PEPCID) 20 MG tablet Take 1 tablet (20 mg total) by mouth daily. 14 tablet 0  . glimepiride (AMARYL) 2 MG tablet Take 1 tablet (2 mg total) by mouth daily with breakfast. 30 tablet 0  . losartan (COZAAR) 100 MG tablet Take 1 tablet (100 mg total) by mouth daily. 30 tablet 0  . lovastatin (MEVACOR) 20 MG tablet Take 20 mg by mouth at bedtime.    . metFORMIN (GLUCOPHAGE) 500 MG tablet Take 1 tablet (500 mg total) by mouth 2 (two) times daily with a meal. 60 tablet 0  . metoprolol tartrate (LOPRESSOR) 50 MG tablet Take 1 tablet (50 mg total) by mouth 2 (two) times daily. 60 tablet 3   No facility-administered medications prior to visit.     ROS Review of Systems  Constitutional: Negative.   Eyes: Negative.   Respiratory: Negative.   Cardiovascular: Negative.   Gastrointestinal: Positive for abdominal distention and abdominal pain.       Heartburn   Skin: Negative.   Neurological: Negative.    Objective:  BP (!) 180/70   Pulse 68   Temp 98.7 F (37.1 C) (Oral)   Resp 18   Ht _0  (1.702 m)   Wt 184 lb 9.6 oz (83.7 kg)   LMP 08/13/2014 (Approximate)   SpO2 98%   BMI 28.91 kg/m   BP/Weight 04/09/2017 03/05/2017 12/16/3974  Systolic BP 734 193  161  Diastolic BP 70 99 61  Wt. (Lbs) 184.6 183 -  BMI 28.91 28.66 -   Physical Exam  Constitutional: She appears well-developed and well-nourished.  HENT:  Head: Normocephalic and atraumatic.  Right Ear: External ear normal.  Left Ear: External ear normal.  Nose: Nose normal.  Mouth/Throat: Oropharynx is clear and moist.  Eyes: Pupils are equal, round, and reactive to light. Conjunctivae are normal.  Neck: Normal range of motion. Neck supple.  Cardiovascular: Normal rate, regular rhythm, normal heart sounds and intact distal pulses.   Pulmonary/Chest: Effort normal and breath sounds  normal.  Abdominal: Soft. Bowel sounds are normal. There is tenderness (lower quadrants).  Lymphadenopathy:    She has no cervical adenopathy.  Skin: Skin is warm and dry.  Nursing note and vitals reviewed.  Diabetic Foot Exam - Simple   Simple Foot Form Diabetic Foot exam was performed with the following findings:  Yes 04/09/2017  9:50 AM  Visual Inspection No deformities, no ulcerations, no other skin breakdown bilaterally:  Yes Sensation Testing Intact to touch and monofilament testing bilaterally:  Yes Pulse Check Posterior Tibialis and Dorsalis pulse intact bilaterally:  Yes Comments      Assessment & Plan:   Problem List Items Addressed This Visit      Other   Elevated glucose   Relevant Medications   metFORMIN (GLUCOPHAGE) 500 MG tablet   glimepiride (AMARYL) 2 MG tablet   Other Relevant Orders   Glucose (CBG) (Completed)    Other Visit Diagnoses    Type 2 diabetes mellitus without complication, without long-term current use of insulin ([place])    -  Primary   Start taking CBG's and/or bring glucometer to next office visit.    Follow up with clinical pharmacist in 2 weeks   Follow up with PCP in 8 weeks.   Relevant Medications   metFORMIN (GLUCOPHAGE) 500 MG tablet   losartan (COZAAR) 100 MG tablet   glimepiride (AMARYL) 2 MG tablet   Other Relevant Orders   Lipid Panel (Completed)   CMP and Liver (Completed)   Gastroesophageal reflux disease with esophagitis       GI referral placed. Apply for orange card .   Relevant Medications   pantoprazole (PROTONIX) 20 MG tablet   Other Relevant Orders   Ambulatory referral to Gastroenterology   H. pylori breath test (Completed)   History of anemia       Relevant Orders   Ambulatory referral to Gastroenterology   CBC with Differential (Completed)   Hypertension, unspecified type       Relevant Medications   metoprolol tartrate (LOPRESSOR) 50 MG tablet   losartan (COZAAR) 100 MG tablet   amLODipine (NORVASC) 10 MG  tablet   cloNIDine (CATAPRES) tablet 0.2 mg   Lower abdominal pain       Relevant Orders   POCT urinalysis dipstick (Completed)   CBC with Differential (Completed)   CMP and Liver (Completed)   Screening for cholesterol level       Relevant Orders   Lipid Panel (Completed)      Meds ordered this encounter  Medications  . metFORMIN (GLUCOPHAGE) 500 MG tablet    Sig: Take 1 tablet (500 mg total) by mouth 2 (two) times daily with a meal.    Dispense:  180 tablet    Refill:  3    Must have office visit refills.    Order Specific Question:   Supervising Provider    Answer:   Angelica Chessman  E [[id number]]  . metoprolol tartrate (LOPRESSOR) 50 MG tablet    Sig: Take 1 tablet (50 mg total) by mouth 2 (two) times daily.    Dispense:  60 tablet    Refill:  3    Order Specific Question:   Supervising Provider    Answer:   Tresa Garter [id number]  . pantoprazole (PROTONIX) 20 MG tablet    Sig: Take 1 tablet (20 mg total) by mouth daily.    Dispense:  90 tablet    Refill:  0    Order Specific Question:   Supervising Provider    Answer:   Tresa Garter [id number]  . losartan (COZAAR) 100 MG tablet    Sig: Take 1 tablet (100 mg total) by mouth daily.    Dispense:  90 tablet    Refill:  0    Order Specific Question:   Supervising Provider    Answer:   Tresa Garter [id number]  . glimepiride (AMARYL) 2 MG tablet    Sig: Take 1 tablet (2 mg total) by mouth daily with breakfast.    Dispense:  90 tablet    Refill:  0    Order Specific Question:   Supervising Provider    Answer:   Tresa Garter [id number]  . amLODipine (NORVASC) 10 MG tablet    Sig: Take 1 tablet (10 mg total) by mouth daily.    Dispense:  30 tablet    Refill:  2    Order Specific Question:   Supervising Provider    Answer:   Tresa Garter [id number]  . cloNIDine (CATAPRES) tablet 0.2 mg    Follow-up: Return in about 2 weeks (around 04/23/2017) for HTN/ DM with St[facility].   Alfonse Spruce FNP

## 2017-04-09 NOTE — Patient Instructions (Addendum)
Bring glucometer or glucose log to next office visit. Start checking blood sugars twice a day am/pm.  Food Choices for Gastroesophageal Reflux Disease, Adult When you have gastroesophageal reflux disease (GERD), the foods you eat and your eating habits are very important. Choosing the right foods can help ease your discomfort. What guidelines do I need to follow?  Choose fruits, vegetables, whole grains, and low-fat dairy products.  Choose low-fat meat, fish, and poultry.  Limit fats such as oils, salad dressings, butter, nuts, and avocado.  Keep a food diary. This helps you identify foods that cause symptoms.  Avoid foods that cause symptoms. These may be different for everyone.  Eat small meals often instead of 3 large meals a day.  Eat your meals slowly, in a place where you are relaxed.  Limit fried foods.  Cook foods using methods other than frying.  Avoid drinking alcohol.  Avoid drinking large amounts of liquids with your meals.  Avoid bending over or lying down until 2-3 hours after eating. What foods are not recommended? These are some foods and drinks that may make your symptoms worse: Vegetables Tomatoes. Tomato juice. Tomato and spaghetti sauce. Chili peppers. Onion and garlic. Horseradish. Fruits Oranges, grapefruit, and lemon (fruit and juice). Meats High-fat meats, fish, and poultry. This includes hot dogs, ribs, ham, sausage, salami, and bacon. Dairy Whole milk and chocolate milk. Sour cream. Cream. Butter. Ice cream. Cream cheese. Drinks Coffee and tea. Bubbly (carbonated) drinks or energy drinks. Condiments Hot sauce. Barbecue sauce. Sweets/Desserts Chocolate and cocoa. Donuts. Peppermint and spearmint. Fats and Oils High-fat foods. This includes [place] fries and potato chips. Other Vinegar. Strong spices. This includes black pepper, white pepper, red pepper, cayenne, curry powder, cloves, ginger, and chili powder. The items listed above may not be  a complete list of foods and drinks to avoid. Contact your dietitian for more information. This information is not intended to replace advice given to you by your health care provider. Make sure you discuss any questions you have with your health care provider. Document Released: 01/29/2012 Document Revised: 01/05/2016 Document Reviewed: 06/03/2013 Elsevier Interactive Patient Education  2017 [employer].

## 2017-04-10 LAB — LIPID PANEL
CHOL/HDL RATIO: 6 ratio — AB (ref 0.0–4.4)
CHOLESTEROL TOTAL: 304 mg/dL — AB (ref 100–199)
HDL: 51 mg/dL (ref >39)
LDL CALC: 189 mg/dL — AB (ref 0–99)
TRIGLYCERIDES: 319 mg/dL — AB (ref 0–149)
VLDL CHOLESTEROL CAL: 64 mg/dL — AB (ref 5–40)

## 2017-04-10 LAB — CBC WITH DIFFERENTIAL/PLATELET
BASOS ABS: 0 10*3/uL (ref 0.0–0.2)
Basos: 0 %
EOS (ABSOLUTE): 0.1 10*3/uL (ref 0.0–0.4)
Eos: 2 %
Hematocrit: 42.2 % (ref 34.0–46.6)
Hemoglobin: 13.7 g/dL (ref 11.1–15.9)
IMMATURE GRANS (ABS): 0 10*3/uL (ref 0.0–0.1)
Immature Granulocytes: 0 %
Lymphocytes Absolute: 2.2 10*3/uL (ref 0.7–3.1)
Lymphs: 45 %
MCH: 30.5 pg (ref 26.6–33.0)
MCHC: 32.5 g/dL (ref 31.5–35.7)
MCV: 94 fL (ref 79–97)
MONOS ABS: 0.3 10*3/uL (ref 0.1–0.9)
Monocytes: 5 %
NEUTROS ABS: 2.3 10*3/uL (ref 1.4–7.0)
NEUTROS PCT: 48 %
PLATELETS: 217 10*3/uL (ref 150–379)
RBC: 4.49 x10E6/uL (ref 3.77–5.28)
RDW: 13.9 % (ref 12.3–15.4)
WBC: 5 10*3/uL (ref 3.4–10.8)

## 2017-04-10 LAB — CMP AND LIVER
ALBUMIN: 4.4 g/dL (ref 3.5–5.5)
ALT: 19 IU/L (ref 0–32)
AST: 14 IU/L (ref 0–40)
Alkaline Phosphatase: 87 IU/L (ref 39–117)
BILIRUBIN TOTAL: 0.2 mg/dL (ref 0.0–1.2)
BUN: 14 mg/dL (ref 6–24)
Bilirubin, Direct: 0.06 mg/dL (ref 0.00–0.40)
CALCIUM: 9.7 mg/dL (ref 8.7–10.2)
CO2: 20 mmol/L (ref 20–29)
Chloride: 104 mmol/L (ref 96–106)
Creatinine, Ser: 1 mg/dL (ref 0.57–1.00)
GFR, EST AFRICAN AMERICAN: 73 mL/min/{1.73_m2} (ref >59)
GFR, EST NON AFRICAN AMERICAN: 63 mL/min/{1.73_m2} (ref >59)
GLUCOSE: 101 mg/dL — AB (ref 65–99)
Potassium: 4.4 mmol/L (ref 3.5–5.2)
Sodium: 140 mmol/L (ref 134–144)
Total Protein: 7.3 g/dL (ref 6.0–8.5)

## 2017-04-10 LAB — H. PYLORI BREATH TEST: H pylori Breath Test: NEGATIVE

## 2017-04-16 ENCOUNTER — Telehealth: Payer: Self-pay

## 2017-04-16 ENCOUNTER — Other Ambulatory Visit: Payer: Self-pay | Admitting: Family Medicine

## 2017-04-16 DIAGNOSIS — E782 Mixed hyperlipidemia: Secondary | ICD-10-CM | POA: Insufficient documentation

## 2017-04-16 MED ORDER — ATORVASTATIN CALCIUM 20 MG PO TABS: 20.0000 mg | ORAL_TABLET | Freq: Every day | ORAL | 2 refills | Status: DC

## 2017-04-16 NOTE — Telephone Encounter (Signed)
CMA call regarding lab results   Patient verify DOB  Patient was aware and understood  

## 2017-04-16 NOTE — Telephone Encounter (Signed)
-----   Message from Alfonse Spruce, [place] sent at 04/16/2017  7:09 AM EDT ----- Lipid levels were elevated. This can increase your risk of heart disease overtime. Discontinue lovastatin. You will be prescribed atorvastatin. Recommend follow up in 3 months. Start eating a diet low in saturated fat. Limit your intake of fried foods, red meats, and whole milk. Increase activity. Kidney function normal Liver function normal Labs that evaluated your blood cells, fluid and electrolyte balance are normal. No signs of anemia, acute infection, or inflammation present. -H.pylori is negative. H.pylori is a bacteria that can infect the stomach and cause stomach ulcers.

## 2017-04-25 ENCOUNTER — Ambulatory Visit: Payer: Self-pay | Admitting: Pharmacist

## 2017-04-25 ENCOUNTER — Encounter: Payer: Self-pay | Admitting: Gastroenterology

## 2017-04-30 ENCOUNTER — Ambulatory Visit: Payer: Self-pay | Attending: Family Medicine | Admitting: Pharmacist

## 2017-04-30 VITALS — BP 160/84 | HR 64

## 2017-04-30 DIAGNOSIS — I10 Essential (primary) hypertension: Secondary | ICD-10-CM | POA: Insufficient documentation

## 2017-04-30 DIAGNOSIS — Z7984 Long term (current) use of oral hypoglycemic drugs: Secondary | ICD-10-CM | POA: Insufficient documentation

## 2017-04-30 DIAGNOSIS — E119 Type 2 diabetes mellitus without complications: Secondary | ICD-10-CM | POA: Insufficient documentation

## 2017-04-30 DIAGNOSIS — Z79899 Other long term (current) drug therapy: Secondary | ICD-10-CM | POA: Insufficient documentation

## 2017-04-30 LAB — GLUCOSE, POCT (MANUAL RESULT ENTRY): POC GLUCOSE: 73 mg/dL (ref 70–99)

## 2017-04-30 NOTE — Progress Notes (Signed)
    S:     Chief Complaint  Patient presents with  . Medication Management    Patient arrives in good spirits.  Presents for diabetes evaluation, education, and management at the request of Fredia Beets, NP. Patient was referred on 04/09/17.  Patient was last seen by Primary Care Provider on 04/09/17.   She used to be a patient of the [facility]. She would like to stay here so she has more access to resources but she wants to continue to get some of her medications at health department.  Patient reports Diabetes was diagnosed in June 2018  Family/Social History: she reports her father had diabetes and hypertension.  Patient reports adherence with medications.  Current diabetes medications include: metformin 500 mg BID, glimepiride 2 mg daily. Current hypertension medications include: losartan 50 mg daily, amlodipine 10 mg daily, metoprolol 50 mg BID  Patient denies hypoglycemic events.  Patient reported dietary habits: Eats 3 meals a day but did not get a chance to eat lunch today due to coming to her appt.  Patient reported exercise habits: none   Patient denies nocturia.  Patient denies neuropathy. Patient denies visual changes. Patient reports self foot exams.   Patient reports a lot of stress from not having insurance and she continuously gets a lot of bills from her hospital stay. She denies meeting with financial counseling here with [facility].   O:  Physical Exam   ROS   Lab Results  Component Value Date   HGBA1C 9.7 (H) 01/29/2017   Vitals:   04/30/17 1420 04/30/17 1443  BP: (!) 174/86 (!) 160/84  Pulse: 61 64     POCT glucose = 73  Home fasting CBG: 78-113 2 hour post-prandial/random CBG: 100s-156  A/P: Diabetes newly diagnosed currently uncontrolled based on A1c of 9.7. Patient denies hypoglycemic events and is able to verbalize appropriate hypoglycemia management plan. Patient reports adherence with medication. Control is  suboptimal due to dietary indiscretion and sedentary lifestyle.  CBG slightly low at 73 today in office but she had not eaten lunch so will continue to monitor. Patient would really like to come off some of her medications so she will try a few days without the glimepiride to see what readings she gets and if they remain at goal, we can discontinue it. Otherwise, she will restart it. Next A1C anticipated at next visit.   ASCVD risk greater than 7.5%. Consider Aspirin 81 mg at next visit with PCP and continue atorvastatin 20 mg.   Hypertension longstanding currently uncontrolled on current medications. Patient is very stressed due to financial situation and I think stress is contributing to blood pressure. However, I think she will need to go up on her dose of losartan. Will plan to increase once patient has completed financial counseling and can get her medications from [place] as she reports that she will not be able to afford any more medication at this time. Information provided on the DASH diet.   Patient to schedule an appt with financial counseling.   Written patient instructions provided.  Total time in face to face counseling 45 minutes.   Follow up in Pharmacist Clinic Visit in 2 weeks.

## 2017-04-30 NOTE — Patient Instructions (Addendum)
Thank you for coming to see me!  Please make appointment with financial counseling ASAP  Come back and see me in 2 weeks    DASH Eating Plan DASH stands for "Dietary Approaches to Stop Hypertension." The DASH eating plan is a healthy eating plan that has been shown to reduce high blood pressure (hypertension). It may also reduce your risk for type 2 diabetes, heart disease, and stroke. The DASH eating plan may also help with weight loss. What are tips for following this plan? General guidelines  Avoid eating more than 2,300 mg (milligrams) of salt (sodium) a day. If you have hypertension, you may need to reduce your sodium intake to 1,500 mg a day.  Limit alcohol intake to no more than 1 drink a day for nonpregnant women and 2 drinks a day for men. One drink equals 12 oz of beer, 5 oz of wine, or 1 oz of hard liquor.  Work with your health care provider to maintain a healthy body weight or to lose weight. Ask what an ideal weight is for you.  Get at least 30 minutes of exercise that causes your heart to beat faster (aerobic exercise) most days of the week. Activities may include walking, swimming, or biking.  Work with your health care provider or diet and nutrition specialist (dietitian) to adjust your eating plan to your individual calorie needs. Reading food labels  Check food labels for the amount of sodium per serving. Choose foods with less than 5 percent of the Daily Value of sodium. Generally, foods with less than 300 mg of sodium per serving fit into this eating plan.  To find whole grains, look for the word "whole" as the first word in the ingredient list. Shopping  Buy products labeled as "low-sodium" or "no salt added."  Buy fresh foods. Avoid canned foods and premade or frozen meals. Cooking  Avoid adding salt when cooking. Use salt-free seasonings or herbs instead of table salt or sea salt. Check with your health care provider or pharmacist before using salt  substitutes.  Do not fry foods. Cook foods using healthy methods such as baking, boiling, grilling, and broiling instead.  Cook with heart-healthy oils, such as olive, canola, soybean, or sunflower oil. Meal planning   Eat a balanced diet that includes: ? 5 or more servings of fruits and vegetables each day. At each meal, try to fill half of your plate with fruits and vegetables. ? Up to 6-8 servings of whole grains each day. ? Less than 6 oz of lean meat, poultry, or fish each day. A 3-oz serving of meat is about the same size as a deck of cards. One egg equals 1 oz. ? 2 servings of low-fat dairy each day. ? A serving of nuts, seeds, or beans 5 times each week. ? Heart-healthy fats. Healthy fats called Omega-3 fatty acids are found in foods such as flaxseeds and coldwater fish, like sardines, salmon, and mackerel.  Limit how much you eat of the following: ? Canned or prepackaged foods. ? Food that is high in trans fat, such as fried foods. ? Food that is high in saturated fat, such as fatty meat. ? Sweets, desserts, sugary drinks, and other foods with added sugar. ? Full-fat dairy products.  Do not salt foods before eating.  Try to eat at least 2 vegetarian meals each week.  Eat more home-cooked food and less restaurant, buffet, and fast food.  When eating at a restaurant, ask that your food be prepared  with less salt or no salt, if possible. What foods are recommended? The items listed may not be a complete list. Talk with your dietitian about what dietary choices are best for you. Grains Whole-grain or whole-wheat bread. Whole-grain or whole-wheat pasta. Brown rice. [place]. Bulgur. Whole-grain and low-sodium cereals. Pita bread. Low-fat, low-sodium crackers. Whole-wheat flour tortillas. Vegetables Fresh or frozen vegetables (raw, steamed, roasted, or grilled). Low-sodium or reduced-sodium tomato and vegetable juice. Low-sodium or reduced-sodium tomato sauce and tomato  paste. Low-sodium or reduced-sodium canned vegetables. Fruits All fresh, dried, or frozen fruit. Canned fruit in natural juice (without added sugar). Meat and other protein foods Skinless chicken or [place]. Ground chicken or [place]. Pork with fat trimmed off. Fish and seafood. Egg whites. Dried beans, peas, or lentils. Unsalted nuts, nut butters, and seeds. Unsalted canned beans. Lean cuts of beef with fat trimmed off. Low-sodium, lean deli meat. Dairy Low-fat (1%) or fat-free (skim) milk. Fat-free, low-fat, or reduced-fat cheeses. Nonfat, low-sodium ricotta or cottage cheese. Low-fat or nonfat yogurt. Low-fat, low-sodium cheese. Fats and oils Soft margarine without trans fats. Vegetable oil. Low-fat, reduced-fat, or light mayonnaise and salad dressings (reduced-sodium). Canola, safflower, olive, soybean, and sunflower oils. Avocado. Seasoning and other foods Herbs. Spices. Seasoning mixes without salt. Unsalted popcorn and pretzels. Fat-free sweets. What foods are not recommended? The items listed may not be a complete list. Talk with your dietitian about what dietary choices are best for you. Grains Baked goods made with fat, such as croissants, muffins, or some breads. Dry pasta or rice meal packs. Vegetables Creamed or fried vegetables. Vegetables in a cheese sauce. Regular canned vegetables (not low-sodium or reduced-sodium). Regular canned tomato sauce and paste (not low-sodium or reduced-sodium). Regular tomato and vegetable juice (not low-sodium or reduced-sodium). Angie Fava. Olives. Fruits Canned fruit in a light or heavy syrup. Fried fruit. Fruit in cream or butter sauce. Meat and other protein foods Fatty cuts of meat. Ribs. Fried meat. Berniece Salines. Sausage. Bologna and other processed lunch meats. Salami. Fatback. Hotdogs. Bratwurst. Salted nuts and seeds. Canned beans with added salt. Canned or smoked fish. Whole eggs or egg yolks. Chicken or [place] with skin. Dairy Whole or 2% milk,  cream, and half-and-half. Whole or full-fat cream cheese. Whole-fat or sweetened yogurt. Full-fat cheese. Nondairy creamers. Whipped toppings. Processed cheese and cheese spreads. Fats and oils Butter. Stick margarine. Lard. Shortening. Ghee. Bacon fat. Tropical oils, such as coconut, palm kernel, or palm oil. Seasoning and other foods Salted popcorn and pretzels. Onion salt, garlic salt, seasoned salt, table salt, and sea salt. Worcestershire sauce. Tartar sauce. Barbecue sauce. Teriyaki sauce. Soy sauce, including reduced-sodium. Steak sauce. Canned and packaged gravies. Fish sauce. Oyster sauce. Cocktail sauce. Horseradish that you find on the shelf. Ketchup. Mustard. Meat flavorings and tenderizers. Bouillon cubes. Hot sauce and Tabasco sauce. Premade or packaged marinades. Premade or packaged taco seasonings. Relishes. Regular salad dressings. Where to find more information:  National Heart, Lung, and [place]: https://wilson-eaton.com/  American Heart Association: www.heart.org Summary  The DASH eating plan is a healthy eating plan that has been shown to reduce high blood pressure (hypertension). It may also reduce your risk for type 2 diabetes, heart disease, and stroke.  With the DASH eating plan, you should limit salt (sodium) intake to 2,300 mg a day. If you have hypertension, you may need to reduce your sodium intake to 1,500 mg a day.  When on the DASH eating plan, aim to eat more fresh fruits and vegetables, whole grains, lean  proteins, low-fat dairy, and heart-healthy fats.  Work with your health care provider or diet and nutrition specialist (dietitian) to adjust your eating plan to your individual calorie needs. This information is not intended to replace advice given to you by your health care provider. Make sure you discuss any questions you have with your health care provider. Document Released: 07/19/2011 Document Revised: 07/23/2016 Document Reviewed: 07/23/2016 Elsevier  Interactive Patient Education  2017 [employer].

## 2017-05-16 ENCOUNTER — Ambulatory Visit: Payer: Self-pay | Attending: Family Medicine | Admitting: Pharmacist

## 2017-05-16 VITALS — BP 180/87 | HR 60

## 2017-05-16 DIAGNOSIS — Z79899 Other long term (current) drug therapy: Secondary | ICD-10-CM | POA: Insufficient documentation

## 2017-05-16 DIAGNOSIS — Z8249 Family history of ischemic heart disease and other diseases of the circulatory system: Secondary | ICD-10-CM | POA: Insufficient documentation

## 2017-05-16 DIAGNOSIS — E119 Type 2 diabetes mellitus without complications: Secondary | ICD-10-CM | POA: Insufficient documentation

## 2017-05-16 DIAGNOSIS — I10 Essential (primary) hypertension: Secondary | ICD-10-CM | POA: Insufficient documentation

## 2017-05-16 DIAGNOSIS — Z833 Family history of diabetes mellitus: Secondary | ICD-10-CM | POA: Insufficient documentation

## 2017-05-16 DIAGNOSIS — Z7984 Long term (current) use of oral hypoglycemic drugs: Secondary | ICD-10-CM | POA: Insufficient documentation

## 2017-05-16 LAB — POCT GLYCOSYLATED HEMOGLOBIN (HGB A1C): Hemoglobin A1C: 6.8

## 2017-05-16 MED ORDER — AMLODIPINE BESYLATE 10 MG PO TABS: 10.0000 mg | ORAL_TABLET | Freq: Every day | ORAL | 2 refills | Status: DC

## 2017-05-16 MED FILL — AMLODIPINE BESYLATE 10 MG T: 10 | 30 days supply | Qty: 30 | Fill #0

## 2017-05-16 NOTE — Patient Instructions (Signed)
Thanks for coming to see me!  No changes to your diabetes medications  Start amlodipine 10 mg daily.  Come back in 1 week for a blood pressure check  Finish the financial packet

## 2017-05-16 NOTE — Progress Notes (Signed)
    S:     Chief Complaint  Patient presents with  . Medication Management    Patient arrives in good spirits.  Presents for diabetes evaluation, education, and management at the request of Fredia Beets, NP. Patient was referred on 04/09/17.  Patient was last seen by Primary Care Provider on 04/09/17.   Patient reports Diabetes was diagnosed in June 2018  Family/Social History: she reports her father had diabetes and hypertension.  Patient reports adherence with medications.  Current diabetes medications include: metformin 500 mg BID, glimepiride 2 mg daily (she continued this medication). Current hypertension medications include: losartan 50 mg daily, amlodipine 5 mg daily, metoprolol 50 mg BID  Patient denies hypoglycemic events.  Patient reported dietary habits: Eats 3 meals a day  Patient reported exercise habits: none   Patient denies nocturia.  Patient denies neuropathy. Patient denies visual changes. Patient reports self foot exams.   O:  Physical Exam   ROS   Lab Results  Component Value Date   HGBA1C 6.8 05/16/2017   Vitals:   05/16/17 0924  BP: (!) 180/87  Pulse: 60     POCT glucose = 73  Home fasting CBG: 78-113 2 hour post-prandial/random CBG: 100s-156  A/P: Diabetes newly diagnosed currently controlled based on A1c of 6.8 down from 9.7. Patient denies hypoglycemic events and is able to verbalize appropriate hypoglycemia management plan. Patient reports adherence with medication. Congratulated patient on meeting her A1c goal! No changes to medications at this time.   ASCVD risk greater than 7.5%. Consider Aspirin 81 mg at next visit with PCP and continue atorvastatin 20 mg.   Hypertension longstanding currently uncontrolled on current medications. Patient continues to be stressed. Patient showed me her bottle for amlodipine and she actually only has the 5 mg tablets. She never filled the 10 mg tablets. Reordered the 10 mg tablets and instructed  patient to take 10 mg. Patient is also only taking 50 mg of losartan so we have room to go up on that next time if needed.  Patient to schedule an appt with financial counseling.   Written patient instructions provided.  Total time in face to face counseling 25 minutes.   Follow up in Pharmacist Clinic Visit in 2 weeks. Patient seen with [place], PharmD Candidate

## 2017-05-29 ENCOUNTER — Ambulatory Visit: Payer: Self-pay | Attending: Family Medicine | Admitting: Pharmacist

## 2017-05-29 VITALS — BP 156/76

## 2017-05-29 DIAGNOSIS — I10 Essential (primary) hypertension: Secondary | ICD-10-CM | POA: Insufficient documentation

## 2017-05-29 NOTE — Progress Notes (Signed)
   S:    Patient arrives in good spirits.  Presents to the clinic for hypertension evaluation. Patient was referred on 04/09/17.  Patient was last seen by Primary Care Provider on 04/09/17.   Patient reports adherence with medications. Current hypertension medications include: losartan 100 mg daily, amlodipine 10 mg daily, metoprolol 50 mg BID  Dietary habits include: avoids added salt   O:   Last 3 Office BP readings: BP Readings from Last 3 Encounters:  05/29/17 (!) 156/76  05/16/17 (!) 180/87  04/30/17 (!) 160/84    BMET    Component Value Date/Time   NA 140 04/09/2017 1128   K 4.4 04/09/2017 1128   CL 104 04/09/2017 1128   CO2 20 04/09/2017 1128   GLUCOSE 101 (H) 04/09/2017 1128   GLUCOSE 97 02/04/2017 0555   BUN 14 04/09/2017 1128   CREATININE 1.00 04/09/2017 1128   CALCIUM 9.7 04/09/2017 1128   GFRNONAA 63 04/09/2017 1128   GFRAA 73 04/09/2017 1128    A/P: Hypertension longstanding currently uncontrolled on current medications but much improved. Continued current medications, patient is very hesitant to change her medications right now and would like to try walking more to bring down her blood pressure. Told patient that we would give her a month and if it was still elevated at the next visit, we would have to add a new medication. Patient verbalized understanding.   Results reviewed and written information provided.   Total time in face-to-face counseling 20 minutes.   F/U Clinic Visit with Fredia Beets, NP

## 2017-05-29 NOTE — Patient Instructions (Addendum)
Thanks for coming to see [place]  Your blood pressure is much better  Come back and see Mand[place]ia in 1 month for diabetes and blood pressure follow up   Hypertension Hypertension is another name for high blood pressure. High blood pressure forces your heart to work harder to pump blood. This can cause problems over time. There are two numbers in a blood pressure reading. There is a top number (systolic) over a bottom number (diastolic). It is best to have a blood pressure below 120/80. Healthy choices can help lower your blood pressure. You may need medicine to help lower your blood pressure if:  Your blood pressure cannot be lowered with healthy choices.  Your blood pressure is higher than 130/80.  Follow these instructions at home: Eating and drinking  If directed, follow the DASH eating plan. This diet includes: ? Filling half of your plate at each meal with fruits and vegetables. ? Filling one quarter of your plate at each meal with whole grains. Whole grains include whole wheat pasta, brown rice, and whole grain bread. ? Eating or drinking low-fat dairy products, such as skim milk or low-fat yogurt. ? Filling one quarter of your plate at each meal with low-fat (lean) proteins. Low-fat proteins include fish, skinless chicken, eggs, beans, and tofu. ? Avoiding fatty meat, cured and processed meat, or chicken with skin. ? Avoiding premade or processed food.  Eat less than 1,500 mg of salt (sodium) a day.  Limit alcohol use to no more than 1 drink a day for nonpregnant women and 2 drinks a day for men. One drink equals 12 oz of beer, 5 oz of wine, or 1 oz of hard liquor. Lifestyle  Work with your doctor to stay at a healthy weight or to lose weight. Ask your doctor what the best weight is for you.  Get at least 30 minutes of exercise that causes your heart to beat faster (aerobic exercise) most days of the week. This may include walking, swimming, or biking.  Get at least 30 minutes of  exercise that strengthens your muscles (resistance exercise) at least 3 days a week. This may include lifting weights or pilates.  Do not use any products that contain nicotine or tobacco. This includes cigarettes and e-cigarettes. If you need help quitting, ask your doctor.  Check your blood pressure at home as told by your doctor.  Keep all follow-up visits as told by your doctor. This is important. Medicines  Take over-the-counter and prescription medicines only as told by your doctor. Follow directions carefully.  Do not skip doses of blood pressure medicine. The medicine does not work as well if you skip doses. Skipping doses also puts you at risk for problems.  Ask your doctor about side effects or reactions to medicines that you should watch for. Contact a doctor if:  You think you are having a reaction to the medicine you are taking.  You have headaches that keep coming back (recurring).  You feel dizzy.  You have swelling in your ankles.  You have trouble with your vision. Get help right away if:  You get a very bad headache.  You start to feel confused.  You feel weak or numb.  You feel faint.  You get very bad pain in your: ? Chest. ? Belly (abdomen).  You throw up (vomit) more than once.  You have trouble breathing. Summary  Hypertension is another name for high blood pressure.  Making healthy choices can help lower blood pressure.  If your blood pressure cannot be controlled with healthy choices, you may need to take medicine. This information is not intended to replace advice given to you by your health care provider. Make sure you discuss any questions you have with your health care provider. Document Released: 01/16/2008 Document Revised: 06/27/2016 Document Reviewed: 06/27/2016 Elsevier Interactive Patient Education  2018 [place] Eating Plan DASH stands for "Dietary Approaches to Stop Hypertension." The DASH eating plan is a healthy  eating plan that has been shown to reduce high blood pressure (hypertension). It may also reduce your risk for type 2 diabetes, heart disease, and stroke. The DASH eating plan may also help with weight loss. What are tips for following this plan? General guidelines  Avoid eating more than 2,300 mg (milligrams) of salt (sodium) a day. If you have hypertension, you may need to reduce your sodium intake to 1,500 mg a day.  Limit alcohol intake to no more than 1 drink a day for nonpregnant women and 2 drinks a day for men. One drink equals 12 oz of beer, 5 oz of wine, or 1 oz of hard liquor.  Work with your health care provider to maintain a healthy body weight or to lose weight. Ask what an ideal weight is for you.  Get at least 30 minutes of exercise that causes your heart to beat faster (aerobic exercise) most days of the week. Activities may include walking, swimming, or biking.  Work with your health care provider or diet and nutrition specialist (dietitian) to adjust your eating plan to your individual calorie needs. Reading food labels  Check food labels for the amount of sodium per serving. Choose foods with less than 5 percent of the Daily Value of sodium. Generally, foods with less than 300 mg of sodium per serving fit into this eating plan.  To find whole grains, look for the word "whole" as the first word in the ingredient list. Shopping  Buy products labeled as "low-sodium" or "no salt added."  Buy fresh foods. Avoid canned foods and premade or frozen meals. Cooking  Avoid adding salt when cooking. Use salt-free seasonings or herbs instead of table salt or sea salt. Check with your health care provider or pharmacist before using salt substitutes.  Do not fry foods. Cook foods using healthy methods such as baking, boiling, grilling, and broiling instead.  Cook with heart-healthy oils, such as olive, canola, soybean, or sunflower oil. Meal planning   Eat a balanced diet that  includes: ? 5 or more servings of fruits and vegetables each day. At each meal, try to fill half of your plate with fruits and vegetables. ? Up to 6-8 servings of whole grains each day. ? Less than 6 oz of lean meat, poultry, or fish each day. A 3-oz serving of meat is about the same size as a deck of cards. One egg equals 1 oz. ? 2 servings of low-fat dairy each day. ? A serving of nuts, seeds, or beans 5 times each week. ? Heart-healthy fats. Healthy fats called Omega-3 fatty acids are found in foods such as flaxseeds and coldwater fish, like sardines, salmon, and mackerel.  Limit how much you eat of the following: ? Canned or prepackaged foods. ? Food that is high in trans fat, such as fried foods. ? Food that is high in saturated fat, such as fatty meat. ? Sweets, desserts, sugary drinks, and other foods with added sugar. ? Full-fat dairy products.  Do not salt  foods before eating.  Try to eat at least 2 vegetarian meals each week.  Eat more home-cooked food and less restaurant, buffet, and fast food.  When eating at a restaurant, ask that your food be prepared with less salt or no salt, if possible. What foods are recommended? The items listed may not be a complete list. Talk with your dietitian about what dietary choices are best for you. Grains Whole-grain or whole-wheat bread. Whole-grain or whole-wheat pasta. Brown rice. [place]. Bulgur. Whole-grain and low-sodium cereals. Pita bread. Low-fat, low-sodium crackers. Whole-wheat flour tortillas. Vegetables Fresh or frozen vegetables (raw, steamed, roasted, or grilled). Low-sodium or reduced-sodium tomato and vegetable juice. Low-sodium or reduced-sodium tomato sauce and tomato paste. Low-sodium or reduced-sodium canned vegetables. Fruits All fresh, dried, or frozen fruit. Canned fruit in natural juice (without added sugar). Meat and other protein foods Skinless chicken or [place]. Ground chicken or [place]. Pork with fat  trimmed off. Fish and seafood. Egg whites. Dried beans, peas, or lentils. Unsalted nuts, nut butters, and seeds. Unsalted canned beans. Lean cuts of beef with fat trimmed off. Low-sodium, lean deli meat. Dairy Low-fat (1%) or fat-free (skim) milk. Fat-free, low-fat, or reduced-fat cheeses. Nonfat, low-sodium ricotta or cottage cheese. Low-fat or nonfat yogurt. Low-fat, low-sodium cheese. Fats and oils Soft margarine without trans fats. Vegetable oil. Low-fat, reduced-fat, or light mayonnaise and salad dressings (reduced-sodium). Canola, safflower, olive, soybean, and sunflower oils. Avocado. Seasoning and other foods Herbs. Spices. Seasoning mixes without salt. Unsalted popcorn and pretzels. Fat-free sweets. What foods are not recommended? The items listed may not be a complete list. Talk with your dietitian about what dietary choices are best for you. Grains Baked goods made with fat, such as croissants, muffins, or some breads. Dry pasta or rice meal packs. Vegetables Creamed or fried vegetables. Vegetables in a cheese sauce. Regular canned vegetables (not low-sodium or reduced-sodium). Regular canned tomato sauce and paste (not low-sodium or reduced-sodium). Regular tomato and vegetable juice (not low-sodium or reduced-sodium). Angie Fava. Olives. Fruits Canned fruit in a light or heavy syrup. Fried fruit. Fruit in cream or butter sauce. Meat and other protein foods Fatty cuts of meat. Ribs. Fried meat. Berniece Salines. Sausage. Bologna and other processed lunch meats. Salami. Fatback. Hotdogs. Bratwurst. Salted nuts and seeds. Canned beans with added salt. Canned or smoked fish. Whole eggs or egg yolks. Chicken or [place] with skin. Dairy Whole or 2% milk, cream, and half-and-half. Whole or full-fat cream cheese. Whole-fat or sweetened yogurt. Full-fat cheese. Nondairy creamers. Whipped toppings. Processed cheese and cheese spreads. Fats and oils Butter. Stick margarine. Lard. Shortening. Ghee. Bacon fat.  Tropical oils, such as coconut, palm kernel, or palm oil. Seasoning and other foods Salted popcorn and pretzels. Onion salt, garlic salt, seasoned salt, table salt, and sea salt. Worcestershire sauce. Tartar sauce. Barbecue sauce. Teriyaki sauce. Soy sauce, including reduced-sodium. Steak sauce. Canned and packaged gravies. Fish sauce. Oyster sauce. Cocktail sauce. Horseradish that you find on the shelf. Ketchup. Mustard. Meat flavorings and tenderizers. Bouillon cubes. Hot sauce and Tabasco sauce. Premade or packaged marinades. Premade or packaged taco seasonings. Relishes. Regular salad dressings. Where to find more information:  National Heart, Lung, and [place]: https://wilson-eaton.com/  American Heart Association: www.heart.org Summary  The DASH eating plan is a healthy eating plan that has been shown to reduce high blood pressure (hypertension). It may also reduce your risk for type 2 diabetes, heart disease, and stroke.  With the DASH eating plan, you should limit salt (sodium) intake to 2,300  mg a day. If you have hypertension, you may need to reduce your sodium intake to 1,500 mg a day.  When on the DASH eating plan, aim to eat more fresh fruits and vegetables, whole grains, lean proteins, low-fat dairy, and heart-healthy fats.  Work with your health care provider or diet and nutrition specialist (dietitian) to adjust your eating plan to your individual calorie needs. This information is not intended to replace advice given to you by your health care provider. Make sure you discuss any questions you have with your health care provider. Document Released: 07/19/2011 Document Revised: 07/23/2016 Document Reviewed: 07/23/2016 Elsevier Interactive Patient Education  2017 [place].   Managing Your Hypertension Hypertension is commonly called high blood pressure. This is when the force of your blood pressing against the walls of your arteries is too strong. Arteries are blood vessels  that carry blood from your heart throughout your body. Hypertension forces the heart to work harder to pump blood, and may cause the arteries to become narrow or stiff. Having untreated or uncontrolled hypertension can cause heart attack, stroke, kidney disease, and other problems. What are blood pressure readings? A blood pressure reading consists of a higher number over a lower number. Ideally, your blood pressure should be below 120/80. The first ("top") number is called the systolic pressure. It is a measure of the pressure in your arteries as your heart beats. The second ("bottom") number is called the diastolic pressure. It is a measure of the pressure in your arteries as the heart relaxes. What does my blood pressure reading mean? Blood pressure is classified into four stages. Based on your blood pressure reading, your health care provider may use the following stages to determine what type of treatment you need, if any. Systolic pressure and diastolic pressure are measured in a unit called mm Hg. Normal  Systolic pressure: below 979.  Diastolic pressure: below 80. Elevated  Systolic pressure: 480-165.  Diastolic pressure: below 80. Hypertension stage 1  Systolic pressure: 537-482.  Diastolic pressure: 70-78. Hypertension stage 2  Systolic pressure: 675 or above.  Diastolic pressure: 90 or above. What health risks are associated with hypertension? Managing your hypertension is an important responsibility. Uncontrolled hypertension can lead to:  A heart attack.  A stroke.  A weakened blood vessel (aneurysm).  Heart failure.  Kidney damage.  Eye damage.  Metabolic syndrome.  Memory and concentration problems.  What changes can I make to manage my hypertension? Hypertension can be managed by making lifestyle changes and possibly by taking medicines. Your health care provider will help you make a plan to bring your blood pressure within a normal range. Eating and  drinking  Eat a diet that is high in fiber and potassium, and low in salt (sodium), added sugar, and fat. An example eating plan is called the DASH (Dietary Approaches to Stop Hypertension) diet. To eat this way: ? Eat plenty of fresh fruits and vegetables. Try to fill half of your plate at each meal with fruits and vegetables. ? Eat whole grains, such as whole wheat pasta, brown rice, or whole grain bread. Fill about one quarter of your plate with whole grains. ? Eat low-fat diary products. ? Avoid fatty cuts of meat, processed or cured meats, and poultry with skin. Fill about one quarter of your plate with lean proteins such as fish, chicken without skin, beans, eggs, and tofu. ? Avoid premade and processed foods. These tend to be higher in sodium, added sugar, and fat.  Reduce your daily sodium intake. Most people with hypertension should eat less than 1,500 mg of sodium a day.  Limit alcohol intake to no more than 1 drink a day for nonpregnant women and 2 drinks a day for men. One drink equals 12 oz of beer, 5 oz of wine, or 1 oz of hard liquor. Lifestyle  Work with your health care provider to maintain a healthy body weight, or to lose weight. Ask what an ideal weight is for you.  Get at least 30 minutes of exercise that causes your heart to beat faster (aerobic exercise) most days of the week. Activities may include walking, swimming, or biking.  Include exercise to strengthen your muscles (resistance exercise), such as weight lifting, as part of your weekly exercise routine. Try to do these types of exercises for 30 minutes at least 3 days a week.  Do not use any products that contain nicotine or tobacco, such as cigarettes and e-cigarettes. If you need help quitting, ask your health care provider.  Control any long-term (chronic) conditions you have, such as high cholesterol or diabetes. Monitoring  Monitor your blood pressure at home as told by your health care provider. Your  personal target blood pressure may vary depending on your medical conditions, your age, and other factors.  Have your blood pressure checked regularly, as often as told by your health care provider. Working with your health care provider  Review all the medicines you take with your health care provider because there may be side effects or interactions.  Talk with your health care provider about your diet, exercise habits, and other lifestyle factors that may be contributing to hypertension.  Visit your health care provider regularly. Your health care provider can help you create and adjust your plan for managing hypertension. Will I need medicine to control my blood pressure? Your health care provider may prescribe medicine if lifestyle changes are not enough to get your blood pressure under control, and if:  Your systolic blood pressure is 130 or higher.  Your diastolic blood pressure is 80 or higher.  Take medicines only as told by your health care provider. Follow the directions carefully. Blood pressure medicines must be taken as prescribed. The medicine does not work as well when you skip doses. Skipping doses also puts you at risk for problems. Contact a health care provider if:  You think you are having a reaction to medicines you have taken.  You have repeated (recurrent) headaches.  You feel dizzy.  You have swelling in your ankles.  You have trouble with your vision. Get help right away if:  You develop a severe headache or confusion.  You have unusual weakness or numbness, or you feel faint.  You have severe pain in your chest or abdomen.  You vomit repeatedly.  You have trouble breathing. Summary  Hypertension is when the force of blood pumping through your arteries is too strong. If this condition is not controlled, it may put you at risk for serious complications.  Your personal target blood pressure may vary depending on your medical conditions, your age,  and other factors. For most people, a normal blood pressure is less than 120/80.  Hypertension is managed by lifestyle changes, medicines, or both. Lifestyle changes include weight loss, eating a healthy, low-sodium diet, exercising more, and limiting alcohol. This information is not intended to replace advice given to you by your health care provider. Make sure you discuss any questions you have with your health care  provider. Document Released: 04/23/2012 Document Revised: 06/27/2016 Document Reviewed: 06/27/2016 Elsevier Interactive Patient Education  [employer].

## 2017-06-07 ENCOUNTER — Telehealth: Payer: Self-pay | Admitting: Family Medicine

## 2017-06-07 ENCOUNTER — Ambulatory Visit: Payer: Self-pay | Attending: Family Medicine

## 2017-06-07 NOTE — Telephone Encounter (Signed)
Pt came to the office to request a refill for  fluticasone (FLONASE) 50 MCG/ACT nasal spray  She was getting this at the [facility] but since she is coming here she want the provider to order this for her, please follow up

## 2017-06-12 ENCOUNTER — Other Ambulatory Visit: Payer: Self-pay | Admitting: Family Medicine

## 2017-06-12 DIAGNOSIS — J3089 Other allergic rhinitis: Secondary | ICD-10-CM

## 2017-06-12 MED ORDER — FLUTICASONE PROPIONATE 50 MCG/ACT NA SUSP: 1.0000 | Freq: Every day | NASAL | 6 refills | Status: DC | PRN

## 2017-06-12 NOTE — Telephone Encounter (Signed)
CMA call regarding medication is ready been sent to our pharmacy & can come & pick it up   Patient was aware and understood

## 2017-06-12 NOTE — Telephone Encounter (Signed)
Medication sent to [facility] pharmacy

## 2017-06-19 ENCOUNTER — Ambulatory Visit: Payer: Self-pay | Admitting: Gastroenterology

## 2017-07-03 ENCOUNTER — Ambulatory Visit: Payer: Self-pay | Attending: Family Medicine | Admitting: Family Medicine

## 2017-07-03 ENCOUNTER — Encounter: Payer: Self-pay | Admitting: Family Medicine

## 2017-07-03 VITALS — BP 152/72 | HR 69 | Temp 98.7°F | Resp 18 | Ht 67.0 in | Wt 200.6 lb

## 2017-07-03 DIAGNOSIS — Z79899 Other long term (current) drug therapy: Secondary | ICD-10-CM | POA: Insufficient documentation

## 2017-07-03 DIAGNOSIS — R7309 Other abnormal glucose: Secondary | ICD-10-CM

## 2017-07-03 DIAGNOSIS — Z7984 Long term (current) use of oral hypoglycemic drugs: Secondary | ICD-10-CM | POA: Insufficient documentation

## 2017-07-03 DIAGNOSIS — J3089 Other allergic rhinitis: Secondary | ICD-10-CM

## 2017-07-03 DIAGNOSIS — J309 Allergic rhinitis, unspecified: Secondary | ICD-10-CM | POA: Insufficient documentation

## 2017-07-03 DIAGNOSIS — Z760 Encounter for issue of repeat prescription: Secondary | ICD-10-CM | POA: Insufficient documentation

## 2017-07-03 DIAGNOSIS — I10 Essential (primary) hypertension: Secondary | ICD-10-CM | POA: Insufficient documentation

## 2017-07-03 DIAGNOSIS — E119 Type 2 diabetes mellitus without complications: Secondary | ICD-10-CM | POA: Insufficient documentation

## 2017-07-03 LAB — GLUCOSE, POCT (MANUAL RESULT ENTRY): POC GLUCOSE: 105 mg/dL — AB (ref 70–99)

## 2017-07-03 MED ORDER — GLIMEPIRIDE 2 MG PO TABS: 2.0000 mg | ORAL_TABLET | Freq: Every day | ORAL | 5 refills | Status: DC

## 2017-07-03 MED ORDER — HYDROCHLOROTHIAZIDE 25 MG PO TABS: 25.0000 mg | ORAL_TABLET | Freq: Every day | ORAL | 5 refills | Status: DC

## 2017-07-03 MED ORDER — BLOOD PRESSURE MONITOR AUTOMAT DEVI: 1.0000 | Freq: Once | 0 refills | Status: AC

## 2017-07-03 MED ORDER — METFORMIN HCL 500 MG PO TABS: 500.0000 mg | ORAL_TABLET | Freq: Two times a day (BID) | ORAL | 3 refills | Status: DC

## 2017-07-03 MED ORDER — FLUTICASONE PROPIONATE 50 MCG/ACT NA SUSP: 1.0000 | Freq: Every day | NASAL | 6 refills | Status: DC | PRN

## 2017-07-03 MED ORDER — AMLODIPINE BESYLATE 10 MG PO TABS: 10.0000 mg | ORAL_TABLET | Freq: Every day | ORAL | 5 refills | Status: DC

## 2017-07-03 NOTE — Patient Instructions (Signed)
Type 2 Diabetes Mellitus, Self Care, Adult When you have type 2 diabetes (type 2 diabetes mellitus), you must keep your blood sugar (glucose) under control. You can do this with:  Nutrition.  Exercise.  Lifestyle changes.  Medicines or insulin, if needed.  Support from your doctors and others.  How do I manage my blood sugar?  Check your blood sugar level every day, as often as told.  Call your doctor if your blood sugar is above your goal numbers for 2 tests in a row.  Have your A1c (hemoglobin A1c) level checked at least two times a year. Have it checked more often if your doctor tells you to. Your doctor will set treatment goals for you. Generally, you should have these blood sugar levels:  Before meals (preprandial): 80-130 mg/dL (4.4-7.2 mmol/L).  After meals (postprandial): lower than 180 mg/dL (10 mmol/L).  A1c level: less than 7%.  What do I need to know about high blood sugar? High blood sugar is called hyperglycemia. Know the signs of high blood sugar. Signs may include:  Feeling: ? Thirsty. ? Hungry. ? Very tired.  Needing to pee (urinate) more than usual.  Blurry vision.  What do I need to know about low blood sugar? Low blood sugar is called hypoglycemia. This is when blood sugar is at or below 70 mg/dL (3.9 mmol/L). Symptoms may include:  Feeling: ? Hungry. ? Worried or nervous (anxious). ? Sweaty and clammy. ? Confused. ? Dizzy. ? Sleepy. ? Sick to your stomach (nauseous).  Having: ? A fast heartbeat (palpitations). ? A headache. ? A change in your vision. ? Jerky movements that you cannot control (seizure). ? Nightmares. ? Tingling or no feeling (numbness) around the mouth, lips, or tongue.  Having trouble with: ? Talking. ? Paying attention (concentrating). ? Moving (coordination). ? Sleeping.  Shaking.  Passing out (fainting).  Getting upset easily (irritability).  Treating low blood sugar  To treat low blood sugar, eat or  drink something sugary right away. If you can think clearly and swallow safely, follow the 15:15 rule:  Take 15 grams of a fast-acting carb (carbohydrate). Some fast-acting carbs are: ? 1 tube of glucose gel. ? 3 sugar tablets (glucose pills). ? 6-8 pieces of hard candy. ? 4 oz (120 mL) of fruit juice. ? 4 oz (120 mL) regular (not diet) soda.  Check your blood sugar 15 minutes after you take the carb.  If your blood sugar is still at or below 70 mg/dL (3.9 mmol/L), take 15 grams of a carb again.  If your blood sugar does not go above 70 mg/dL (3.9 mmol/L) after 3 tries, get help right away.  After your blood sugar goes back to normal, eat a meal or a snack within 1 hour.  Treating very low blood sugar If your blood sugar is at or below 54 mg/dL (3 mmol/L), you have very low blood sugar (severe hypoglycemia). This is an emergency. Do not wait to see if the symptoms will go away. Get medical help right away. Call your local emergency services (911 in the [place] Do not drive yourself to the hospital. If you have very low blood sugar and you cannot eat or drink, you may need a glucagon shot (injection). A family member or friend should learn how to check your blood sugar and how to give you a glucagon shot. Ask your doctor if you need to have a glucagon shot kit at home. What else is important to manage my diabetes? Medicine  Follow these instructions about insulin and diabetes medicines:  Take them as told by your doctor.  Adjust them as told by your doctor.  Do not run out of them.  Having diabetes can raise your risk for other long-term conditions. These include heart or kidney disease. Your doctor may prescribe medicines to help prevent problems from diabetes. Food   Make healthy food choices. These include: ? Chicken, fish, egg whites, and beans. ? Oats, whole wheat, bulgur, brown rice, quinoa, and millet. ? Fresh fruits and vegetables. ? Low-fat dairy products. ? Nuts,  avocado, olive oil, and canola oil.  Make a food plan with a specialist (dietitian).  Follow instructions from your doctor about what you cannot eat or drink.  Drink enough fluid to keep your pee (urine) clear or pale yellow.  Eat healthy snacks between healthy meals.  Keep track of carbs that you eat. Read food labels. Learn food serving sizes.  Follow your sick day plan when you cannot eat or drink normally. Make this plan with your doctor so it is ready to use. Activity  Exercise at least 3 times a week.  Do not go more than 2 days without exercising.  Talk with your doctor before you start a new exercise. Your doctor may need to adjust your insulin, medicines, or food. Lifestyle   Do not use any tobacco products. These include cigarettes, chewing tobacco, and e-cigarettes.If you need help quitting, ask your doctor.  Ask your doctor how much alcohol is safe for you.  Learn to deal with stress. If you need help with this, ask your doctor. Body care  Stay up to date with your shots (immunizations).  Have your eyes and feet checked by a doctor as often as told.  Check your skin and feet every day. Check for cuts, bruises, redness, blisters, or sores.  Brush your teeth and gums two times a day.  Floss at least one time a day.  Go to the dentist least one time every 6 months.  Stay at a healthy weight. General instructions   Take over-the-counter and prescription medicines only as told by your doctor.  Share your diabetes care plan with: ? Your work or school. ? People you live with.  Check your pee (urine) for ketones: ? When you are sick. ? As told by your doctor.  Carry a card or wear jewelry that says that you have diabetes.  Ask your doctor: ? Do I need to meet with a diabetes educator? ? Where can I find a support group for people with diabetes?  Keep all follow-up visits as told by your doctor. This is important. Where to find more information: To  learn more about diabetes, visit:  American Diabetes Association: www.diabetes.org  American Association of Diabetes Educators: www.diabeteseducator.org/patient-resources  This information is not intended to replace advice given to you by your health care provider. Make sure you discuss any questions you have with your health care provider. Document Released: 11/21/2015 Document Revised: 01/05/2016 Document Reviewed: 09/02/2015 Elsevier Interactive Patient Education  [employer].

## 2017-07-03 NOTE — Progress Notes (Signed)
Patient is here for f/up  

## 2017-07-07 NOTE — Progress Notes (Signed)
Subjective:  Patient ID: Sydney Morrow, female    DOB: 1961/05/15  Age: 56 y.o. MRN: [id number]  CC: Hypertension   HPI Sydney Morrow presents to establish care.History of hypertension. She is not exercising and is not adherent to low salt diet. She does not check BP  at home.  BP elevated in office today she reports taking her antihypertensive medications prior to office visit.  Cardiac symptoms none. Patient denies chest pain, chest pressure/discomfort, claudication, dyspnea, near-syncope, palpitations and syncope.  Cardiovascular risk factors: dyslipidemia, hypertension and sedentary lifestyle. Use of agents associated with hypertension: none. History of target organ damage: none. History of DM.  Symptoms: none. Patient denies foot ulcerations, nausea, paresthesia of the feet, polydipsia, polyuria, visual disturbances and vomitting.  Evaluation to date has been included: fasting lipid panel and hemoglobin A1C.  Home sugars: patient does not check sugars. Treatment to date: metformin and glimepiride.     Outpatient Medications Prior to Visit  Medication Sig Dispense Refill  . atorvastatin (LIPITOR) 20 MG tablet Take 1 tablet (20 mg total) by mouth daily. 30 tablet 2  . Blood Glucose Monitoring Suppl (TRUE METRIX METER) DEVI 1 kit by Does not apply route 4 (four) times daily. 1 Device 0  . glucose blood (TRUE METRIX BLOOD GLUCOSE TEST) test strip Use as instructed 100 each 12  . loratadine (CLARITIN) 10 MG tablet Take 10 mg by mouth daily.    Marland Kitchen losartan (COZAAR) 100 MG tablet Take 1 tablet (100 mg total) by mouth daily. 90 tablet 0  . metoprolol tartrate (LOPRESSOR) 50 MG tablet Take 1 tablet (50 mg total) by mouth 2 (two) times daily. 60 tablet 3  . pantoprazole (PROTONIX) 20 MG tablet Take 1 tablet (20 mg total) by mouth daily. 90 tablet 0  . polyethylene glycol (MIRALAX / GLYCOLAX) packet Take 17 g by mouth daily as needed. 14 each 0  . sodium bicarbonate/sodium chloride SOLN 1  application by Mouth Rinse route as needed for mouth pain. 500 mL 0  . traMADol (ULTRAM) 50 MG tablet Take 1 tablet (50 mg total) by mouth every 8 (eight) hours as needed. 30 tablet 0  . TRUEPLUS LANCETS 28G MISC 28 g by Does not apply route 4 (four) times daily. 120 each 5  . amLODipine (NORVASC) 10 MG tablet Take 1 tablet (10 mg total) by mouth daily. 30 tablet 2  . fluticasone (FLONASE) 50 MCG/ACT nasal spray Place 1 spray into both nostrils daily as needed for allergies or rhinitis. 16 g 6  . glimepiride (AMARYL) 2 MG tablet Take 1 tablet (2 mg total) by mouth daily with breakfast. 90 tablet 0  . metFORMIN (GLUCOPHAGE) 500 MG tablet Take 1 tablet (500 mg total) by mouth 2 (two) times daily with a meal. 180 tablet 3   Facility-Administered Medications Prior to Visit  Medication Dose Route Frequency Provider Last Rate Last Dose  . cloNIDine (CATAPRES) tablet 0.2 mg  0.2 mg Oral Once Hairston, Maylon Peppers, FNP        ROS Review of Systems  Constitutional: Negative.   Eyes: Negative.   Respiratory: Negative.   Cardiovascular: Negative.   Gastrointestinal: Negative.   Skin: Negative.   Neurological: Negative for dizziness, syncope, weakness and light-headedness.   Objective:  BP (!) 152/72   Pulse 69   Temp 98.7 F (37.1 C) (Oral)   Resp 18   Ht 5' 7"  (1.702 m)   Wt 200 lb 9.6 oz (91 kg)   LMP 08/13/2014 (  Approximate)   SpO2 98%   BMI 31.42 kg/m   BP/Weight 07/03/2017 05/29/2017 83/11/1961  Systolic BP 229 798 921  Diastolic BP 72 76 87  Wt. (Lbs) 200.6 - -  BMI 31.42 - -   Physical Exam  Constitutional: She appears well-developed and well-nourished.  HENT:  Head: Normocephalic and atraumatic.  Right Ear: External ear normal.  Left Ear: External ear normal.  Nose: Nose normal.  Mouth/Throat: Oropharynx is clear and moist.  Eyes: Conjunctivae are normal. Pupils are equal, round, and reactive to light.  Cardiovascular: Normal rate, regular rhythm, normal heart sounds and  intact distal pulses.  Pulmonary/Chest: Effort normal and breath sounds normal.  Abdominal: Soft. Bowel sounds are normal. There is no tenderness.  Skin: Skin is warm and dry.  Nursing note and vitals reviewed.   Assessment & Plan:   1. Type 2 diabetes mellitus without complication, without long-term current use of insulin (HCC)  - Glucose (CBG) - glimepiride (AMARYL) 2 MG tablet; Take 1 tablet (2 mg total) by mouth daily with breakfast.  Dispense: 30 tablet; Refill: 5 - metFORMIN (GLUCOPHAGE) 500 MG tablet; Take 1 tablet (500 mg total) by mouth 2 (two) times daily with a meal.  Dispense: 180 tablet; Refill: 3  2. Essential hypertension  - amLODipine (NORVASC) 10 MG tablet; Take 1 tablet (10 mg total) by mouth daily.  Dispense: 30 tablet; Refill: 5 - hydrochlorothiazide (HYDRODIURIL) 25 MG tablet; Take 1 tablet (25 mg total) by mouth daily.  Dispense: 30 tablet; Refill: 5 - Blood Pressure Monitoring (BLOOD PRESSURE MONITOR AUTOMAT) DEVI; 1 Device by Does not apply route once for 1 dose.  Dispense: 1 Device; Refill: 0  3. Allergic rhinitis due to other allergic trigger, unspecified seasonality Medication refill. - fluticasone (FLONASE) 50 MCG/ACT nasal spray; Place 1 spray into both nostrils daily as needed for allergies or rhinitis.  Dispense: 16 g; Refill: 6     Follow-up: Return in about 2 weeks (around 07/17/2017) for BP check with [place].   Alfonse Spruce FNP

## 2017-07-16 ENCOUNTER — Ambulatory Visit: Payer: Self-pay | Attending: Family Medicine | Admitting: *Deleted

## 2017-07-16 ENCOUNTER — Telehealth: Payer: Self-pay | Admitting: *Deleted

## 2017-07-16 VITALS — BP 160/70 | HR 61

## 2017-07-16 DIAGNOSIS — Z79899 Other long term (current) drug therapy: Secondary | ICD-10-CM | POA: Insufficient documentation

## 2017-07-16 DIAGNOSIS — I10 Essential (primary) hypertension: Secondary | ICD-10-CM | POA: Insufficient documentation

## 2017-07-16 NOTE — Progress Notes (Signed)
Pt arrived to [facility] alert and oriented. Last OV 07/03/17 BP reading was 152/72  with PCP.  Pt denies chest pain, SOB, HA, dizziness, or blurred vision.  Verified medication. Pt states medication was taken this morning.   Blood pressure reading elevated at nurse visit: 162/88 and 160/70.  PCP aware of BP reading.  Pt to be notified of changes to medications.

## 2017-07-16 NOTE — Telephone Encounter (Signed)
Pt came to [facility] to for BP check.  BP was elevated at Nurse visit. 160/70 and 162/88 Please send medications to pharmacy. Pt request [place] Once adjustments are made.

## 2017-07-17 ENCOUNTER — Other Ambulatory Visit: Payer: Self-pay | Admitting: Family Medicine

## 2017-07-17 DIAGNOSIS — I10 Essential (primary) hypertension: Secondary | ICD-10-CM | POA: Insufficient documentation

## 2017-07-17 MED ORDER — CLONIDINE HCL 0.1 MG PO TABS: 0.1000 mg | ORAL_TABLET | Freq: Two times a day (BID) | ORAL | 2 refills | Status: DC

## 2017-07-17 NOTE — Telephone Encounter (Signed)
Ask patient if she is a current smoker, if so I can prescribe gum, patches, or medication to help her quit. Will add clonidine 0.1 mg BID. Recommend BP visit in 2 weeks with Carilyn Goodpasture.

## 2017-07-18 NOTE — Telephone Encounter (Signed)
Left message on voicemail. Pt to return call. Medication at pharmacy

## 2017-07-19 NOTE — Telephone Encounter (Signed)
Left message on voicemail to return call.

## 2017-07-30 ENCOUNTER — Ambulatory Visit: Payer: Self-pay | Admitting: Licensed Clinical Social Worker

## 2017-08-08 ENCOUNTER — Other Ambulatory Visit: Payer: Self-pay | Admitting: Family Medicine

## 2017-08-08 ENCOUNTER — Ambulatory Visit: Payer: Self-pay | Attending: Family Medicine | Admitting: Licensed Clinical Social Worker

## 2017-08-08 ENCOUNTER — Ambulatory Visit: Payer: Self-pay | Attending: Family Medicine | Admitting: *Deleted

## 2017-08-08 ENCOUNTER — Ambulatory Visit: Payer: Self-pay | Admitting: Gastroenterology

## 2017-08-08 VITALS — BP 167/83 | HR 62

## 2017-08-08 DIAGNOSIS — I10 Essential (primary) hypertension: Secondary | ICD-10-CM | POA: Insufficient documentation

## 2017-08-08 DIAGNOSIS — F17200 Nicotine dependence, unspecified, uncomplicated: Secondary | ICD-10-CM

## 2017-08-08 DIAGNOSIS — F419 Anxiety disorder, unspecified: Secondary | ICD-10-CM | POA: Insufficient documentation

## 2017-08-08 DIAGNOSIS — F329 Major depressive disorder, single episode, unspecified: Secondary | ICD-10-CM | POA: Insufficient documentation

## 2017-08-08 MED ORDER — NICOTINE 14 MG/24HR TD PT24: 14.0000 mg | MEDICATED_PATCH | Freq: Every day | TRANSDERMAL | 2 refills | Status: DC

## 2017-08-08 NOTE — BH Specialist Note (Signed)
Integrated Behavioral Health Initial Visit  MRN: [id number] Name: Sydney Morrow  Number of [place] Clinician visits:: 1/6 Session Start time: 10:00 AM  Session End time: 10:25 AM Total time: 25 minutes  Type of Service: [place] Interpretor:No. Interpretor Name and Language: N/A   Warm Hand Off Completed.       SUBJECTIVE: Sydney Morrow is a 56 y.o. female accompanied by self Patient was referred by FNP Hairston for depression and anxiety. Patient reports the following symptoms/concerns: feelings of sadness and worry, difficulty sleeping, and low energy  Duration of problem: 9 months; Severity of problem: mild  OBJECTIVE: Mood: Pleasant and Affect: Appropriate Risk of harm to self or others: No plan to harm self or others  LIFE CONTEXT: Family and Social: Pt receives strong support from family, friends, and her church School/Work: Pt is employed part-time through a [employer]. She receives food stamps and is interested in enrolling in college next month Self-Care: No report of substance use Life Changes: Pt has ongoing medical conditions and assists immediate family members with their medical concerns. She is experiencing financial strain due to medical bills  GOALS ADDRESSED: Patient will: 1. Reduce symptoms of: anxiety and depression 2. Increase knowledge and/or ability of: coping skills and self-management skills  3. Demonstrate ability to: Increase adequate support systems for patient/family  INTERVENTIONS: Interventions utilized: Mindfulness or Psychologist, educational, Supportive Counseling, Psychoeducation and/or Health Education and Link to [employer]  Standardized Assessments completed: GAD-7 and PHQ 2&9  ASSESSMENT: Patient currently experiencing depression and anxiety triggered by ongoing medical conditions that resulted in financial strain and caring for immediate family members with  declining health. Pt reports feelings of sadness and worry, difficulty sleeping, and low energy. She receives strong support from family, friends, and her church.   Patient may benefit from psychoeducation and psychotherapy. LCSWA educated pt on the correlation between one's physical and mental health, in addition, to how stress can negatively impact both. LCSWA introduced relaxation techniques to assist pt in decreasing symptoms. Pt has completed an appointment with Financial Counseling to address financial strain. LCSWA referred pt to [employer] to assist with obtaining independent housing. Pt was strongly encouraged to participate in Diabetes Support Group through [facility] to manage health and strengthen support system.  PLAN: 1. Follow up with behavioral health clinician on : Pt was encouraged to contact LCSWA if symptoms worsen or fail to improve to schedule behavioral appointments at [facility]. 2. Behavioral recommendations: LCSWA recommends that pt apply healthy coping skills discussed and utilize provided resources. Pt is encouraged to schedule follow up appointment with LCSWA 3. Referral(s): Community Resources:  Finances and Housing and Diabetes Support Group 4. "From scale of 1-10, how likely are you to follow plan?": 9/10  Rebekah Chesterfield, LCSW 08/09/17 11:03 AM

## 2017-08-08 NOTE — Telephone Encounter (Signed)
Please send Nicotine patch to pharmacy. Pt states she may smoke between 2 -10 cigarettes throughout the day.

## 2017-08-08 NOTE — Telephone Encounter (Signed)
Nicotine patches sent to pharmacy on file.

## 2017-08-08 NOTE — Progress Notes (Signed)
Pt arrived to [facility] alert and oriented.  Clonidine 0.1 mg was added 07/16/2017. Pt states she has taken her medication this morning about 0830. She also adds she has not taken  Clonidine. States she wants nicotine patch.She smokes between 3 -10 cigarettes Daily depending day.  Pt denies chest pain, SOB, HA, dizziness, or blurred vision.  Verified medication. Pt states medication was taken this morning.  Blood pressure reading: 167/83

## 2017-08-22 ENCOUNTER — Other Ambulatory Visit: Payer: Self-pay | Admitting: *Deleted

## 2017-08-22 ENCOUNTER — Telehealth: Payer: Self-pay | Admitting: *Deleted

## 2017-08-22 ENCOUNTER — Ambulatory Visit: Payer: Self-pay | Attending: Family Medicine | Admitting: *Deleted

## 2017-08-22 VITALS — BP 133/77 | HR 78

## 2017-08-22 DIAGNOSIS — I10 Essential (primary) hypertension: Secondary | ICD-10-CM | POA: Insufficient documentation

## 2017-08-22 DIAGNOSIS — E119 Type 2 diabetes mellitus without complications: Secondary | ICD-10-CM

## 2017-08-22 DIAGNOSIS — Z7689 Persons encountering health services in other specified circumstances: Secondary | ICD-10-CM | POA: Insufficient documentation

## 2017-08-22 DIAGNOSIS — J3089 Other allergic rhinitis: Secondary | ICD-10-CM

## 2017-08-22 MED ORDER — METOPROLOL TARTRATE 50 MG PO TABS
50.0000 mg | ORAL_TABLET | Freq: Two times a day (BID) | ORAL | 3 refills | Status: DC
Start: 2017-08-22 — End: 2018-04-07

## 2017-08-22 MED ORDER — FLUTICASONE PROPIONATE 50 MCG/ACT NA SUSP: 1.0000 | Freq: Every day | NASAL | 6 refills | Status: DC | PRN

## 2017-08-22 NOTE — Progress Notes (Signed)
Pt arrived to [facility], alert and oriented. Last OV 08/08/2017 for blood pressure check.  Pt denies chest pain, SOB, HA, dizziness, or blurred vision.  She states that medication makes her feel drained. Verified medication with patient. Pt states medication was taken this morning but was not sure of hydrochlorothiazide.    Blood pressure reading: 154/80 and 56 After 10 minutes, blood pressure was 133/77 Pulse: 78

## 2017-08-22 NOTE — Telephone Encounter (Signed)
Pt was at nurse visit appointment for blood pressure recheck. She inquired about dentures. She states she was informed that we would help her with this.  Please advise.

## 2017-08-23 ENCOUNTER — Other Ambulatory Visit: Payer: Self-pay | Admitting: Family Medicine

## 2017-08-23 DIAGNOSIS — Z972 Presence of dental prosthetic device (complete) (partial): Principal | ICD-10-CM

## 2017-08-23 DIAGNOSIS — K0889 Other specified disorders of teeth and supporting structures: Secondary | ICD-10-CM

## 2017-08-23 DIAGNOSIS — Z463 Encounter for fitting and adjustment of dental prosthetic device: Secondary | ICD-10-CM

## 2017-08-23 NOTE — Telephone Encounter (Signed)
Pt aware of message in reference to dental referral.

## 2017-08-23 NOTE — Telephone Encounter (Signed)
I do not know who informed of regarding this. However I can place order for dentist referral for dentures. Please make sure she is aware of waiting list for non-urgent dental referrals.

## 2017-10-02 ENCOUNTER — Ambulatory Visit (INDEPENDENT_AMBULATORY_CARE_PROVIDER_SITE_OTHER): Payer: Self-pay | Admitting: Gastroenterology

## 2017-10-02 ENCOUNTER — Encounter: Payer: Self-pay | Admitting: Gastroenterology

## 2017-10-02 ENCOUNTER — Other Ambulatory Visit: Payer: Self-pay

## 2017-10-02 VITALS — BP 183/89 | HR 77 | Temp 97.0°F | Ht 66.5 in | Wt 189.6 lb

## 2017-10-02 DIAGNOSIS — K279 Peptic ulcer, site unspecified, unspecified as acute or chronic, without hemorrhage or perforation: Secondary | ICD-10-CM

## 2017-10-02 DIAGNOSIS — Z1211 Encounter for screening for malignant neoplasm of colon: Secondary | ICD-10-CM | POA: Insufficient documentation

## 2017-10-02 DIAGNOSIS — Z8711 Personal history of peptic ulcer disease: Secondary | ICD-10-CM

## 2017-10-02 NOTE — Patient Instructions (Signed)
AB advised for pt to not take Metformin the morning of tcs/egd. Noted on pt's instructions.

## 2017-10-02 NOTE — Patient Instructions (Signed)
We have set you up for a colonoscopy for screening purposes and an upper endoscopy to ensure the ulcer is healed.  It was good to meet you today!  It was a pleasure to see you today. I strive to create trusting relationships with patients to provide genuine, compassionate, and quality care. I value your feedback. If you receive a survey regarding your visit,  I greatly appreciate you the taking time to fill this out.   Annitta Needs, PhD, ANP-BC [facility]

## 2017-10-02 NOTE — Progress Notes (Signed)
Primary Care Physician:  Alfonse Spruce, FNP Primary Gastroenterologist:  Dr. Oneida Alar   Chief Complaint  Patient presents with  . Gastroesophageal Reflux    currently not having any issues  . Constipation    does fine when she takes miralax    HPI:   Sydney Morrow is a 57 y.o. female presenting today at the request of Fredia Beets, FNP due to GERD and constipation. She has a history of profound IDA in June 2011 with EGD completed while inpatient. Findings of candida esophagitis, H.pylori, PUD. She has never had a surveillance EGD or screening colonoscopy.   Noted RLQ pain intermittently recently in August 2018-November 2018. She wonders if she was taking too much medication. No issues in "awhile". Possibly one episode since Nov 2018 . Felt constipated during this time. Takes Miralax once every 2 weeks. As long as she eats enough fruits and veggies, no significant issues with constipation. Protonix once daily.   Although last EGD while inpatient was in 2011 by Dr. Gala Romney, she is requesting Dr. Oneida Alar as she states many of her family knows her and has been a patient of hers as well.    Past Medical History:  Diagnosis Date  . Acid reflux   . Diabetes ([place])   . History of anemia   . Hypercholesterolemia   . Hypertension   . Jaw inflammation, right 01/30/2017    Past Surgical History:  Procedure Laterality Date  . DENTAL SURGERY    . ESOPHAGOGASTRODUODENOSCOPY  2011   white, raised, somewhat fixed plaques esophageal mucosa concerning for possible Candida esophagitis s/p brushing. Multiple gastric antral ulcerations, multiple small bulbar erosions and ulcerations.   . TOOTH EXTRACTION N/A 02/01/2017   Procedure: FULL DENTAL EXTRACTIONS;  Surgeon: Diona Browner, DDS;  Location: [facility];  Service: Oral Surgery;  Laterality: N/A;  . TUBAL LIGATION      Current Outpatient Medications  Medication Sig Dispense Refill  . amLODipine (NORVASC) 10 MG tablet Take 1 tablet  (10 mg total) by mouth daily. 30 tablet 5  . Blood Glucose Monitoring Suppl (TRUE METRIX METER) DEVI 1 kit by Does not apply route 4 (four) times daily. 1 Device 0  . cloNIDine (CATAPRES) 0.1 MG tablet Take 1 tablet (0.1 mg total) by mouth 2 (two) times daily. 60 tablet 2  . famotidine (PEPCID) 20 MG tablet Take 20 mg by mouth 2 (two) times daily.    . fluticasone (FLONASE) 50 MCG/ACT nasal spray Place 1 spray into both nostrils daily as needed for allergies or rhinitis. 16 g 6  . glucose blood (TRUE METRIX BLOOD GLUCOSE TEST) test strip Use as instructed 100 each 12  . loratadine (CLARITIN) 10 MG tablet Take 10 mg by mouth daily.    Marland Kitchen losartan (COZAAR) 100 MG tablet Take 1 tablet (100 mg total) by mouth daily. 90 tablet 0  . lovastatin (MEVACOR) 10 MG tablet Take 10 mg by mouth at bedtime.    . metFORMIN (GLUCOPHAGE) 500 MG tablet Take 1 tablet (500 mg total) by mouth 2 (two) times daily with a meal. 180 tablet 3  . metoprolol tartrate (LOPRESSOR) 50 MG tablet Take 1 tablet (50 mg total) by mouth 2 (two) times daily. 60 tablet 3  . Omega-3 Fatty Acids (FISH OIL) 1000 MG CAPS Take by mouth daily.    . polyethylene glycol (MIRALAX / GLYCOLAX) packet Take 17 g by mouth daily as needed. 14 each 0  . hydrochlorothiazide (HYDRODIURIL) 25 MG tablet  Take 1 tablet (25 mg total) by mouth daily. (Patient not taking: Reported on 10/02/2017) 30 tablet 5  . TRUEPLUS LANCETS 28G MISC 28 g by Does not apply route 4 (four) times daily. 120 each 5   Current Facility-Administered Medications  Medication Dose Route Frequency Provider Last Rate Last Dose  . cloNIDine (CATAPRES) tablet 0.2 mg  0.2 mg Oral Once Alfonse Spruce, FNP        Allergies as of 10/02/2017 - Review Complete 10/02/2017  Allergen Reaction Noted  . Aspirin    . Penicillins  01/29/2017    Family History  Problem Relation Age of Onset  . Breast cancer Mother   . Colon cancer Neg Hx   . Colon polyps Neg Hx     Social History    Socioeconomic History  . Marital status: Single    Spouse name: Not on file  . Number of children: Not on file  . Years of education: Not on file  . Highest education level: Not on file  Social Needs  . Financial resource strain: Not on file  . Food insecurity - worry: Not on file  . Food insecurity - inability: Not on file  . Transportation needs - medical: Not on file  . Transportation needs - non-medical: Not on file  Occupational History  . Not on file  Tobacco Use  . Smoking status: Current Every Day Smoker    Packs/day: 0.50    Years: 15.00    Pack years: 7.50    Types: Cigarettes  . Smokeless tobacco: Never Used  Substance and Sexual Activity  . Alcohol use: No  . Drug use: No  . Sexual activity: Not on file  Other Topics Concern  . Not on file  Social History Narrative  . Not on file    Review of Systems: Gen: Denies any fever, chills, fatigue, weight loss, lack of appetite.  CV: Denies chest pain, heart palpitations, peripheral edema, syncope.  Resp: Denies shortness of breath at rest or with exertion. Denies wheezing or cough.  GI: see HPI  GU : Denies urinary burning, urinary frequency, urinary hesitancy MS: Denies joint pain, muscle weakness, cramps, or limitation of movement.  Derm: Denies rash, itching, dry skin Psych: Denies depression, anxiety, memory loss, and confusion Heme: Denies bruising, bleeding, and enlarged lymph nodes.  Physical Exam: BP (!) 183/89   Pulse 77   Temp (!) 97 F (36.1 C) (Oral)   Ht 5' 6.5" (1.689 m)   Wt 189 lb 9.6 oz (86 kg)   LMP 08/13/2014 (Approximate)   BMI 30.14 kg/m  General:   Alert and oriented. Pleasant and cooperative. Well-nourished and well-developed.  Head:  Normocephalic and atraumatic. Eyes:  Without icterus, sclera clear and conjunctiva pink.  Ears:  Normal auditory acuity. Nose:  No deformity, discharge,  or lesions. Mouth:  No deformity or lesions, oral mucosa pink.  Lungs:  Clear to auscultation  bilaterally. No wheezes, rales, or rhonchi. No distress.  Heart:  S1, S2 present without murmurs appreciated.  Abdomen:  +BS, soft, non-tender and non-distended. No HSM noted. No guarding or rebound. No masses appreciated.  Rectal:  Deferred  Msk:  Symmetrical without gross deformities. Normal posture. Extremities:  Without  edema. Neurologic:  Alert and  oriented x4;  grossly normal neurologically. Skin:  Intact without significant lesions or rashes. Psych:  Alert and cooperative. Normal mood and affect.  Lab Results  Component Value Date   WBC 5.0 04/09/2017   HGB 13.7 04/09/2017  HCT 42.2 04/09/2017   MCV 94 04/09/2017   PLT 217 04/09/2017

## 2017-10-06 ENCOUNTER — Encounter: Payer: Self-pay | Admitting: Gastroenterology

## 2017-10-06 NOTE — Assessment & Plan Note (Signed)
Needs initial screening colonoscopy, overdue. Episode of RLQ pain late last year likely related to constipation, which is now resolved. No alarm features. Constipation managed with prn Miralax.   Proceed with colonoscopy with Dr. Oneida Alar in the near future. The risks, benefits, and alternatives have been discussed in detail with the patient. They state understanding and desire to proceed.

## 2017-10-06 NOTE — Assessment & Plan Note (Signed)
57 year old female seen while inpatient in 2011 with profound IDA, found to have PUD secondary to H.pylori and candida esophagitis. She has never had a surveillance EGD. No concerning upper GI symptoms and remains on Protonix once daily.  Proceed with upper endoscopy in the near future with Dr. Oneida Alar. The risks, benefits, and alternatives have been discussed in detail with patient. They have stated understanding and desire to proceed.

## 2017-10-07 ENCOUNTER — Telehealth: Payer: Self-pay | Admitting: Gastroenterology

## 2017-10-07 ENCOUNTER — Telehealth: Payer: Self-pay | Admitting: Internal Medicine

## 2017-10-07 NOTE — Progress Notes (Signed)
CC'D TO PCP °

## 2017-10-07 NOTE — Telephone Encounter (Signed)
OK TO SCHEDULE FOR TCS.

## 2017-10-07 NOTE — Telephone Encounter (Signed)
Received fax from [facility] , fax will be on the PCP in-box

## 2017-10-07 NOTE — Telephone Encounter (Signed)
Dr. Oneida Alar: I recently saw patient in the office who was last seen by this practice in 2011. She was actually seen by Dr. Gala Romney while inpatient and underwent EGD, noting PUD. She has never had surveillance EGD or colonoscopy. She is requesting you as her gastroenterologist, as she states her family has had procedures done by you. IS this ok? We haven't seen her since 2011 until recently.

## 2017-10-31 ENCOUNTER — Ambulatory Visit: Payer: Self-pay | Admitting: Internal Medicine

## 2017-11-01 ENCOUNTER — Telehealth: Payer: Self-pay

## 2017-11-01 ENCOUNTER — Encounter (HOSPITAL_COMMUNITY): Admission: RE | Payer: Self-pay | Source: Ambulatory Visit

## 2017-11-01 ENCOUNTER — Ambulatory Visit (HOSPITAL_COMMUNITY): Admission: RE | Admit: 2017-11-01 | Payer: Self-pay | Source: Ambulatory Visit | Admitting: Gastroenterology

## 2017-11-01 SURGERY — COLONOSCOPY
Anesthesia: Moderate Sedation

## 2017-11-01 NOTE — Telephone Encounter (Signed)
Pt called office. She was scheduled for TCS/EGD today. She forgot and ate yesterday, didn't start clear liquids. Stated she had to go to the doctor, had to take her medicine and ate. OV made to reschedule procedure. Called and informed endo scheduler to cancel procedure.

## 2017-11-03 NOTE — Telephone Encounter (Signed)
Noted  

## 2017-12-03 ENCOUNTER — Ambulatory Visit: Payer: Self-pay | Attending: Nurse Practitioner | Admitting: Nurse Practitioner

## 2017-12-03 ENCOUNTER — Encounter: Payer: Self-pay | Admitting: Nurse Practitioner

## 2017-12-03 VITALS — BP 159/90 | HR 60 | Temp 98.0°F | Ht 67.0 in | Wt 190.0 lb

## 2017-12-03 DIAGNOSIS — E782 Mixed hyperlipidemia: Secondary | ICD-10-CM

## 2017-12-03 DIAGNOSIS — Z885 Allergy status to narcotic agent status: Secondary | ICD-10-CM | POA: Insufficient documentation

## 2017-12-03 DIAGNOSIS — F17210 Nicotine dependence, cigarettes, uncomplicated: Secondary | ICD-10-CM | POA: Insufficient documentation

## 2017-12-03 DIAGNOSIS — Z79899 Other long term (current) drug therapy: Secondary | ICD-10-CM | POA: Insufficient documentation

## 2017-12-03 DIAGNOSIS — Z880 Allergy status to penicillin: Secondary | ICD-10-CM | POA: Insufficient documentation

## 2017-12-03 DIAGNOSIS — Z886 Allergy status to analgesic agent status: Secondary | ICD-10-CM | POA: Insufficient documentation

## 2017-12-03 DIAGNOSIS — Z7984 Long term (current) use of oral hypoglycemic drugs: Secondary | ICD-10-CM | POA: Insufficient documentation

## 2017-12-03 DIAGNOSIS — I10 Essential (primary) hypertension: Secondary | ICD-10-CM

## 2017-12-03 DIAGNOSIS — E119 Type 2 diabetes mellitus without complications: Secondary | ICD-10-CM

## 2017-12-03 DIAGNOSIS — F17200 Nicotine dependence, unspecified, uncomplicated: Secondary | ICD-10-CM

## 2017-12-03 LAB — GLUCOSE, POCT (MANUAL RESULT ENTRY): POC Glucose: 183 mg/dl — AB (ref 70–99)

## 2017-12-03 LAB — POCT GLYCOSYLATED HEMOGLOBIN (HGB A1C): Hemoglobin A1C: 8.4

## 2017-12-03 MED ORDER — LOSARTAN POTASSIUM-HCTZ 100-25 MG PO TABS: 1.0000 | ORAL_TABLET | Freq: Every day | ORAL | 3 refills | Status: DC

## 2017-12-03 NOTE — Patient Instructions (Signed)

## 2017-12-03 NOTE — Progress Notes (Signed)
Assessment & Plan:  Sydney Morrow was seen today for establish care.  Diagnoses and all orders for this visit:  Essential hypertension -     losartan-hydrochlorothiazide (HYZAAR) 100-25 MG tablet; Take 1 tablet by mouth daily. Continue all antihypertensives as prescribed.  Remember to bring in your blood pressure log with you for your follow up appointment.  DASH/Mediterranean Diets are healthier choices for HTN.   Type 2 diabetes mellitus without complication, without long-term current use of insulin (HCC) -     Glucose (CBG) -     HgB A1c Continue blood sugar control as discussed in office today, low carbohydrate diet, and regular physical exercise as tolerated, 150 minutes per week (30 min each day, 5 days per week, or 50 min 3 days per week). Keep blood sugar logs with fasting goal of 80-130 mg/dl, post prandial less than 180.  For Hypoglycemia: BS <60 and Hyperglycemia BS >400; contact the clinic ASAP. Annual eye exams and foot exams are recommended.   Mixed hyperlipidemia INSTRUCTIONS: Work on a low fat, heart healthy diet and participate in regular aerobic exercise program by working out at least 150 minutes per week. No fried foods. No junk foods, sodas, sugary drinks, unhealthy snacking, alcohol or smoking.    Tobacco dependence Sydney Morrow was counseled on the dangers of tobacco use, and was advised to quit. Reviewed strategies to maximize success, including removing cigarettes and smoking materials from environment, stress management and support of family/friends as well as pharmacological alternatives including: Wellbutrin, Chantix, Nicotine patch, Nicotine gum or lozenges. Smoking cessation support: smoking cessation hotline: 1-800-QUIT-NOW.  Smoking cessation classes are also available through [facility]. Call ([phone] or visit our website at [email or web address]   A total of 3 minutes was spent on counseling for smoking cessation and Sydney Morrow is not ready to  quit.     Patient has been counseled on age-appropriate routine health concerns for screening and prevention. These are reviewed and up-to-date. Referrals have been placed accordingly. Immunizations are up-to-date or declined.    Subjective:   Chief Complaint  Patient presents with  . Establish Care    Pt. is here to establish care for diabetes and hypertension.    HPI Sydney Morrow 57 y.o. female presents to office today to establish care. She has a history of Essential hypertension, DM Type 2, hyperlipidemia and continues to smoke cigarettes.    Diabetes Mellitus Type 2 Chronic. She has been checking her blood sugars at home. She reports "sometimes my numbers are high sometimes they are low". She is overdue for eye exam. She reports she has an upcoming appointment with ophthalmology. She denies any hypo or hyperglcyemic symptoms. A1c has increased from 6.8 to 8.4 today. She has not been exercise, diet or medication compliant over the past several weeks due to family issues.  Lab Results  Component Value Date   HGBA1C 8.4 12/03/2017   Essential Hypertension Chronic. Poorly controlled. She does check her blood pressure at home. Reports average 130/70-80s. She is dealing with lots of stress. Just found out her brother has leukemia. She endorses medication compliance. Denies chest pain, shortness of breath, palpitations, lightheadedness, dizziness, headaches or BLE edema.  BP Readings from Last 3 Encounters:  12/03/17 (!) 159/90  10/02/17 (!) 183/89  08/22/17 133/77   Hyperlipidemia Patient presents for follow up to hyperlipidemia.  She is medication compliant. She is diet compliant and denies exertional chest pressure/discomfort, lower extremity edema, palpitations and skin xanthelasma or statin intolerance including  myalgias. LD is not at goal. Fasting lipid panel pending Lab Results  Component Value Date   CHOL 304 (H) 04/09/2017   Lab Results  Component Value Date   HDL 51  04/09/2017   Lab Results  Component Value Date   LDLCALC 189 (H) 04/09/2017   Lab Results  Component Value Date   TRIG 319 (H) 04/09/2017   Lab Results  Component Value Date   CHOLHDL 6.0 (H) 04/09/2017   Review of Systems  Constitutional: Negative for fever, malaise/fatigue and weight loss.  HENT: Negative.  Negative for nosebleeds.   Eyes: Negative.  Negative for blurred vision, double vision and photophobia.  Respiratory: Negative.  Negative for cough and shortness of breath.   Cardiovascular: Negative.  Negative for chest pain, palpitations and leg swelling.  Gastrointestinal: Positive for heartburn. Negative for nausea and vomiting.  Musculoskeletal: Negative.  Negative for myalgias.  Neurological: Negative.  Negative for dizziness, focal weakness, seizures and headaches.  Psychiatric/Behavioral: Negative.  Negative for suicidal ideas.    Past Medical History:  Diagnosis Date  . Acid reflux   . Diabetes ([place])   . History of anemia   . Hypercholesterolemia   . Hypertension   . Jaw inflammation, right 01/30/2017    Past Surgical History:  Procedure Laterality Date  . DENTAL SURGERY    . ESOPHAGOGASTRODUODENOSCOPY  2011   white, raised, somewhat fixed plaques esophageal mucosa concerning for possible Candida esophagitis s/p brushing. Multiple gastric antral ulcerations, multiple small bulbar erosions and ulcerations.   . TOOTH EXTRACTION N/A 02/01/2017   Procedure: FULL DENTAL EXTRACTIONS;  Surgeon: Diona Browner, DDS;  Location: [facility];  Service: Oral Surgery;  Laterality: N/A;  . TUBAL LIGATION      Family History  Problem Relation Age of Onset  . Breast cancer Mother   . Colon cancer Neg Hx   . Colon polyps Neg Hx     Social History Reviewed with no changes to be made today.   Outpatient Medications Prior to Visit  Medication Sig Dispense Refill  . acetaminophen (TYLENOL) 500 MG tablet Take 1,000 mg by mouth every 6 (six) hours as needed for moderate pain  or headache.    Marland Kitchen amLODipine (NORVASC) 10 MG tablet Take 1 tablet (10 mg total) by mouth daily. 30 tablet 5  . Blood Glucose Monitoring Suppl (TRUE METRIX METER) DEVI 1 kit by Does not apply route 4 (four) times daily. 1 Device 0  . cloNIDine (CATAPRES) 0.1 MG tablet Take 1 tablet (0.1 mg total) by mouth 2 (two) times daily. 60 tablet 2  . Coenzyme Q10 (CO Q 10 PO) Take 1 capsule by mouth daily.    . famotidine (PEPCID) 20 MG tablet Take 20 mg by mouth 2 (two) times daily.    . fluticasone (FLONASE) 50 MCG/ACT nasal spray Place 1 spray into both nostrils daily as needed for allergies or rhinitis. 16 g 6  . ibuprofen (ADVIL,MOTRIN) 200 MG tablet Take 400-600 mg by mouth every 6 (six) hours as needed for headache or moderate pain.    Marland Kitchen loratadine (CLARITIN) 10 MG tablet Take 10 mg by mouth daily.    Marland Kitchen lovastatin (MEVACOR) 10 MG tablet Take 10 mg by mouth at bedtime.    . metFORMIN (GLUCOPHAGE) 500 MG tablet Take 1 tablet (500 mg total) by mouth 2 (two) times daily with a meal. 180 tablet 3  . metoprolol tartrate (LOPRESSOR) 50 MG tablet Take 1 tablet (50 mg total) by mouth 2 (two) times daily.  60 tablet 3  . Multiple Vitamins-Minerals (MULTIVITAMIN PO) Take 1 tablet by mouth daily.    . Omega-3 Fatty Acids (FISH OIL) 1000 MG CAPS Take 1,000 mg by mouth 2 (two) times daily.     . polyethylene glycol (MIRALAX / GLYCOLAX) packet Take 17 g by mouth daily as needed. (Patient taking differently: Take 17 g by mouth daily as needed for moderate constipation. ) 14 each 0  . TRUEPLUS LANCETS 28G MISC 28 g by Does not apply route 4 (four) times daily. 120 each 5  . losartan (COZAAR) 100 MG tablet Take 1 tablet (100 mg total) by mouth daily. 90 tablet 0  . glucose blood (TRUE METRIX BLOOD GLUCOSE TEST) test strip Use as instructed (Patient not taking: Reported on 10/23/2017) 100 each 12  . hydrochlorothiazide (HYDRODIURIL) 25 MG tablet Take 1 tablet (25 mg total) by mouth daily. (Patient not taking: Reported on  10/02/2017) 30 tablet 5  . cloNIDine (CATAPRES) tablet 0.2 mg      No facility-administered medications prior to visit.     Allergies  Allergen Reactions  . Aspirin Other (See Comments)    Mouth, upper lip, nose area becomes numb  . Morphine And Related Other (See Comments)    "Did not feel good"  . Penicillins Other (See Comments)    Unknown       Objective:    BP (!) 159/90 (BP Location: Left Arm, Patient Position: Sitting, Cuff Size: Normal)   Pulse 60   Temp 98 F (36.7 C) (Oral)   Ht 5' 7"  (1.702 m)   Wt 190 lb (86.2 kg)   LMP 08/13/2014 (Approximate)   SpO2 99%   BMI 29.76 kg/m  Wt Readings from Last 3 Encounters:  12/03/17 190 lb (86.2 kg)  10/02/17 189 lb 9.6 oz (86 kg)  07/03/17 200 lb 9.6 oz (91 kg)    Physical Exam  Constitutional: She is oriented to person, place, and time. She appears well-developed and well-nourished. She is cooperative.  HENT:  Head: Normocephalic and atraumatic.  Cardiovascular: Normal rate, regular rhythm and normal heart sounds. Exam reveals no gallop and no friction rub.  No murmur heard. Pulmonary/Chest: Effort normal and breath sounds normal. No tachypnea. No respiratory distress. She has no decreased breath sounds. She has no wheezes. She has no rhonchi. She has no rales. She exhibits no tenderness.  Abdominal: Bowel sounds are normal.  Musculoskeletal: Normal range of motion. She exhibits no edema.  Neurological: She is alert and oriented to person, place, and time. Coordination normal.  Skin: Skin is warm and dry.  Psychiatric: She has a normal mood and affect. Her behavior is normal. Judgment and thought content normal.  Nursing note and vitals reviewed.     Patient has been counseled extensively about nutrition and exercise as well as the importance of adherence with medications and regular follow-up. The patient was given clear instructions to go to ER or return to medical center if symptoms don't improve, worsen or new  problems develop. The patient verbalized understanding.   Follow-up: Return in about 1 month (around 12/31/2017) for BP recheck; nurse visit see below.   Gildardo Pounds, FNP-BC [facility] [place], [place]   12/03/2017, 9:34 PM

## 2018-01-15 ENCOUNTER — Encounter: Payer: Self-pay | Admitting: Gastroenterology

## 2018-01-15 ENCOUNTER — Telehealth: Payer: Self-pay | Admitting: Gastroenterology

## 2018-01-15 ENCOUNTER — Ambulatory Visit (INDEPENDENT_AMBULATORY_CARE_PROVIDER_SITE_OTHER): Payer: Self-pay | Admitting: Gastroenterology

## 2018-01-15 ENCOUNTER — Encounter: Payer: Self-pay | Admitting: *Deleted

## 2018-01-15 ENCOUNTER — Other Ambulatory Visit: Payer: Self-pay | Admitting: *Deleted

## 2018-01-15 VITALS — BP 195/94 | HR 83 | Temp 96.6°F | Ht 66.5 in | Wt 187.2 lb

## 2018-01-15 DIAGNOSIS — K279 Peptic ulcer, site unspecified, unspecified as acute or chronic, without hemorrhage or perforation: Secondary | ICD-10-CM

## 2018-01-15 DIAGNOSIS — Z1211 Encounter for screening for malignant neoplasm of colon: Secondary | ICD-10-CM

## 2018-01-15 DIAGNOSIS — Z8711 Personal history of peptic ulcer disease: Secondary | ICD-10-CM

## 2018-01-15 MED ORDER — PANTOPRAZOLE SODIUM 40 MG PO TBEC: 40.0000 mg | DELAYED_RELEASE_TABLET | Freq: Every day | ORAL | 3 refills | Status: DC

## 2018-01-15 NOTE — Assessment & Plan Note (Signed)
Overdue for initial screening colonoscopy. No alarm features. No rectal bleeding.   Proceed with colonoscopy with Dr. Oneida Alar in the near future. The risks, benefits, and alternatives have been discussed in detail with the patient. They state understanding and desire to proceed.

## 2018-01-15 NOTE — Progress Notes (Addendum)
REVIEWED-NO ADDITIONAL RECOMMENDATIONS.  Referring Provider: Orlando Penner* Primary Care Physician:  Gildardo Pounds, NP  Primary GI: Dr. Oneida Alar   Chief Complaint  Patient presents with  . Colonoscopy    reschedule procedure  . Peptic Ulcer Disease    HPI:   Sydney Morrow is a 57 y.o. female presenting today with a history of GERD and constipation. She has a history of profound IDA in June 2011 with EGD completed while inpatient. Findings of candida esophagitis, H.pylori, PUD. She remembers treatment with antibiotic therapy, but I am unsure which regimen. She has never had a surveillance EGD or screening colonoscopy. She is here to reschedule EGD/colonoscopy as she ate prior to her last scheduled procedure, which was then cancelled.   Pepcid BID. Has issues with reflux. Ibuprofen prn. About once or twice a week. No dysphagia. No abdominal pain, constipation, diarrhea. No rectal bleeding. Dealing with sinus issues currently, otherwise doing well.    Past Medical History:  Diagnosis Date  . Acid reflux   . Diabetes ([place])   . History of anemia   . Hypercholesterolemia   . Hypertension   . Jaw inflammation, right 01/30/2017    Past Surgical History:  Procedure Laterality Date  . DENTAL SURGERY    . ESOPHAGOGASTRODUODENOSCOPY  2011   white, raised, somewhat fixed plaques esophageal mucosa concerning for possible Candida esophagitis s/p brushing. Multiple gastric antral ulcerations, multiple small bulbar erosions and ulcerations.   . TOOTH EXTRACTION N/A 02/01/2017   Procedure: FULL DENTAL EXTRACTIONS;  Surgeon: Diona Browner, DDS;  Location: [facility];  Service: Oral Surgery;  Laterality: N/A;  . TUBAL LIGATION      Current Outpatient Medications  Medication Sig Dispense Refill  . acetaminophen (TYLENOL) 500 MG tablet Take 1,000 mg by mouth every 6 (six) hours as needed for moderate pain or headache.    Marland Kitchen amLODipine (NORVASC) 10 MG tablet Take 1 tablet (10 mg total)  by mouth daily. 30 tablet 5  . Blood Glucose Monitoring Suppl (TRUE METRIX METER) DEVI 1 kit by Does not apply route 4 (four) times daily. 1 Device 0  . cloNIDine (CATAPRES) 0.1 MG tablet Take 1 tablet (0.1 mg total) by mouth 2 (two) times daily. 60 tablet 2  . Coenzyme Q10 (CO Q 10 PO) Take 1 capsule by mouth daily.    . famotidine (PEPCID) 20 MG tablet Take 20 mg by mouth 2 (two) times daily.    . fluticasone (FLONASE) 50 MCG/ACT nasal spray Place 1 spray into both nostrils daily as needed for allergies or rhinitis. 16 g 6  . glucose blood (TRUE METRIX BLOOD GLUCOSE TEST) test strip Use as instructed 100 each 12  . ibuprofen (ADVIL,MOTRIN) 200 MG tablet Take 400-600 mg by mouth every 6 (six) hours as needed for headache or moderate pain.    Marland Kitchen loratadine (CLARITIN) 10 MG tablet Take 10 mg by mouth daily.    Marland Kitchen losartan-hydrochlorothiazide (HYZAAR) 100-25 MG tablet Take 1 tablet by mouth daily. 90 tablet 3  . lovastatin (MEVACOR) 10 MG tablet Take 10 mg by mouth at bedtime.    . metFORMIN (GLUCOPHAGE) 500 MG tablet Take 1 tablet (500 mg total) by mouth 2 (two) times daily with a meal. 180 tablet 3  . metoprolol tartrate (LOPRESSOR) 50 MG tablet Take 1 tablet (50 mg total) by mouth 2 (two) times daily. 60 tablet 3  . Multiple Vitamins-Minerals (MULTIVITAMIN PO) Take 1 tablet by mouth daily.    . Omega-3 Fatty  Acids (FISH OIL) 1000 MG CAPS Take 1,000 mg by mouth 2 (two) times daily.     . polyethylene glycol (MIRALAX / GLYCOLAX) packet Take 17 g by mouth daily as needed. (Patient taking differently: Take 17 g by mouth daily as needed for moderate constipation. ) 14 each 0  . TRUEPLUS LANCETS 28G MISC 28 g by Does not apply route 4 (four) times daily. 120 each 5   No current facility-administered medications for this visit.     Allergies as of 01/15/2018 - Review Complete 01/15/2018  Allergen Reaction Noted  . Aspirin Other (See Comments)   . Morphine and related Other (See Comments) 10/23/2017    . Penicillins Other (See Comments) 01/29/2017    Family History  Problem Relation Age of Onset  . Breast cancer Mother   . Colon cancer Neg Hx   . Colon polyps Neg Hx     Social History   Socioeconomic History  . Marital status: Single    Spouse name: Not on file  . Number of children: Not on file  . Years of education: Not on file  . Highest education level: Not on file  Occupational History  . Not on file  Social Needs  . Financial resource strain: Not on file  . Food insecurity:    Worry: Not on file    Inability: Not on file  . Transportation needs:    Medical: Not on file    Non-medical: Not on file  Tobacco Use  . Smoking status: Current Every Day Smoker    Packs/day: 0.50    Years: 15.00    Pack years: 7.50    Types: Cigarettes  . Smokeless tobacco: Never Used  Substance and Sexual Activity  . Alcohol use: No  . Drug use: No  . Sexual activity: Not Currently  Lifestyle  . Physical activity:    Days per week: Not on file    Minutes per session: Not on file  . Stress: Not on file  Relationships  . Social connections:    Talks on phone: Not on file    Gets together: Not on file    Attends religious service: Not on file    Active member of club or organization: Not on file    Attends meetings of clubs or organizations: Not on file    Relationship status: Not on file  Other Topics Concern  . Not on file  Social History Narrative  . Not on file    Review of Systems: Gen: Denies fever, chills, anorexia. Denies fatigue, weakness, weight loss.  CV: Denies chest pain, palpitations, syncope, peripheral edema, and claudication. Resp: Denies dyspnea at rest, cough, wheezing, coughing up blood, and pleurisy. GI: as mentioned in HPI  Derm: Denies rash, itching, dry skin Psych: Denies depression, anxiety, memory loss, confusion. No homicidal or suicidal ideation.  Heme: Denies bruising, bleeding, and enlarged lymph nodes.  Physical Exam: BP (!) 195/94    Pulse 83   Temp (!) 96.6 F (35.9 C) (Oral)   Ht 5' 6.5" (1.689 m)   Wt 187 lb 3.2 oz (84.9 kg)   LMP 08/13/2014 (Approximate)   BMI 29.76 kg/m  General:   Alert and oriented. No distress noted. Pleasant and cooperative.  Head:  Normocephalic and atraumatic. Eyes:  Conjuctiva clear without scleral icterus. Mouth:  Oral mucosa pink and moist.  Lungs: clear to auscultation bilaterally Cardiac: S1 S2 present  Abdomen:  +BS, soft, non-tender and non-distended. No rebound or guarding. No HSM  or masses noted. Msk:  Symmetrical without gross deformities. Normal posture. Extremities:  Without edema. Neurologic:  Alert and  oriented x4 Psych:  Alert and cooperative. Normal mood and affect.

## 2018-01-15 NOTE — Assessment & Plan Note (Signed)
57 year old female with history of PUD in 2011 with H.pylori, s/p treatment. No surveillance EGD. Notes intermittent reflux on Pepcid BID. Will start Protonix once daily, stop Pepcid. Avoid NSAIDs.  Proceed with upper endoscopy in the near future with Dr. Oneida Alar. The risks, benefits, and alternatives have been discussed in detail with patient. They have stated understanding and desire to proceed.

## 2018-01-15 NOTE — Patient Instructions (Addendum)
We have scheduled you for a colonoscopy and upper endoscopy with Dr. Oneida Alar.   No metformin the day of the procedure. Only take blood pressure medications that morning with small sips of water.   Stop Pepcid. Start taking Protonix once each morning, 30 minutes before breakfast. This is for reflux.   We will see you after the procedures!  It was a pleasure to see you today. I strive to create trusting relationships with patients to provide genuine, compassionate, and quality care. I value your feedback. If you receive a survey regarding your visit,  I greatly appreciate you taking time to fill this out.   Annitta Needs, PhD, ANP-BC [facility]

## 2018-01-16 ENCOUNTER — Ambulatory Visit (HOSPITAL_BASED_OUTPATIENT_CLINIC_OR_DEPARTMENT_OTHER): Payer: Self-pay | Admitting: *Deleted

## 2018-01-16 ENCOUNTER — Ambulatory Visit: Payer: Self-pay | Attending: Family Medicine

## 2018-01-16 DIAGNOSIS — I10 Essential (primary) hypertension: Secondary | ICD-10-CM

## 2018-01-16 MED ORDER — LOSARTAN POTASSIUM-HCTZ 100-25 MG PO TABS: 1.0000 | ORAL_TABLET | Freq: Every day | ORAL | 3 refills | Status: DC

## 2018-01-16 MED FILL — LOSARTAN-HCTZ 100-25 MG TAB: 100-25 | 30 days supply | Qty: 30 | Fill #0

## 2018-01-16 NOTE — Progress Notes (Signed)
Pt arrived to [facility]. Pt alert and oriented and arrives in good spirits. Last OV with [name] on 12/03/17 with her blood pressure reading was 159/90.   Pt denies chest pain, SOB, HA, dizziness, or blurred vision.  Verified medication. Pt states medication was taken this morning.  Blood pressure reading today was 169/93 Pt has not taken new medication that was prescribed for her at the last office visit: Losartan. Pt permitted medication to be sent to [facility] pharmacy so she can begin to take.

## 2018-01-21 NOTE — Progress Notes (Signed)
Noted. Agree with plan of care.

## 2018-02-06 ENCOUNTER — Telehealth: Payer: Self-pay | Admitting: Nurse Practitioner

## 2018-02-06 NOTE — Telephone Encounter (Signed)
Patient called and requested for listed medication to be refilled. Patient verbalized understanding that pcp did not prescribe medication. [facility] in [place].  lovastatin (MEVACOR) 10 MG tablet [[id number]]   Patient stated the last prescription she received was 20mg .  [phone]

## 2018-02-06 NOTE — Telephone Encounter (Signed)
Will route to PCP 

## 2018-02-09 ENCOUNTER — Other Ambulatory Visit: Payer: Self-pay | Admitting: Nurse Practitioner

## 2018-02-09 MED ORDER — ATORVASTATIN CALCIUM 20 MG PO TABS: 20.0000 mg | ORAL_TABLET | Freq: Every day | ORAL | 3 refills | Status: DC

## 2018-02-09 NOTE — Telephone Encounter (Signed)
She was switched from lovastatin to atorvastatin last year. Will send atorvastatin to the pharmacy she requested.

## 2018-02-10 NOTE — Telephone Encounter (Signed)
CMA spoke to patient to inform on her Rx is ready.  Patient understood. Patient verified DOB.

## 2018-03-05 ENCOUNTER — Encounter (HOSPITAL_COMMUNITY): Payer: Self-pay

## 2018-03-05 ENCOUNTER — Observation Stay (HOSPITAL_COMMUNITY)
Admission: RE | Admit: 2018-03-05 | Discharge: 2018-03-06 | Disposition: A | Discharge status: Home / Self Care | Payer: Self-pay | Source: Ambulatory Visit | Attending: Family Medicine | Admitting: Family Medicine

## 2018-03-05 ENCOUNTER — Encounter (HOSPITAL_COMMUNITY)
Admission: RE | Disposition: A | Discharge status: Home / Self Care | Payer: Self-pay | Source: Ambulatory Visit | Attending: Internal Medicine

## 2018-03-05 ENCOUNTER — Other Ambulatory Visit: Payer: Self-pay

## 2018-03-05 DIAGNOSIS — E782 Mixed hyperlipidemia: Secondary | ICD-10-CM | POA: Diagnosis present

## 2018-03-05 DIAGNOSIS — IMO0002 Reserved for concepts with insufficient information to code with codable children: Secondary | ICD-10-CM | POA: Diagnosis present

## 2018-03-05 DIAGNOSIS — Z538 Procedure and treatment not carried out for other reasons: Secondary | ICD-10-CM | POA: Insufficient documentation

## 2018-03-05 DIAGNOSIS — K279 Peptic ulcer, site unspecified, unspecified as acute or chronic, without hemorrhage or perforation: Secondary | ICD-10-CM | POA: Diagnosis present

## 2018-03-05 DIAGNOSIS — I10 Essential (primary) hypertension: Secondary | ICD-10-CM | POA: Diagnosis present

## 2018-03-05 DIAGNOSIS — K449 Diaphragmatic hernia without obstruction or gangrene: Secondary | ICD-10-CM | POA: Insufficient documentation

## 2018-03-05 DIAGNOSIS — Z79899 Other long term (current) drug therapy: Secondary | ICD-10-CM | POA: Insufficient documentation

## 2018-03-05 DIAGNOSIS — K219 Gastro-esophageal reflux disease without esophagitis: Secondary | ICD-10-CM | POA: Insufficient documentation

## 2018-03-05 DIAGNOSIS — R001 Bradycardia, unspecified: Secondary | ICD-10-CM | POA: Diagnosis present

## 2018-03-05 DIAGNOSIS — K2950 Unspecified chronic gastritis without bleeding: Secondary | ICD-10-CM | POA: Insufficient documentation

## 2018-03-05 DIAGNOSIS — Z860100 Personal history of colon polyps, unspecified: Secondary | ICD-10-CM

## 2018-03-05 DIAGNOSIS — E871 Hypo-osmolality and hyponatremia: Secondary | ICD-10-CM | POA: Diagnosis present

## 2018-03-05 DIAGNOSIS — E876 Hypokalemia: Secondary | ICD-10-CM | POA: Insufficient documentation

## 2018-03-05 DIAGNOSIS — Z8711 Personal history of peptic ulcer disease: Secondary | ICD-10-CM

## 2018-03-05 DIAGNOSIS — Q438 Other specified congenital malformations of intestine: Secondary | ICD-10-CM | POA: Insufficient documentation

## 2018-03-05 DIAGNOSIS — B370 Candidal stomatitis: Secondary | ICD-10-CM | POA: Diagnosis present

## 2018-03-05 DIAGNOSIS — R739 Hyperglycemia, unspecified: Secondary | ICD-10-CM | POA: Diagnosis present

## 2018-03-05 DIAGNOSIS — Z7984 Long term (current) use of oral hypoglycemic drugs: Secondary | ICD-10-CM | POA: Insufficient documentation

## 2018-03-05 DIAGNOSIS — E1165 Type 2 diabetes mellitus with hyperglycemia: Secondary | ICD-10-CM | POA: Insufficient documentation

## 2018-03-05 DIAGNOSIS — E119 Type 2 diabetes mellitus without complications: Secondary | ICD-10-CM

## 2018-03-05 DIAGNOSIS — Z885 Allergy status to narcotic agent status: Secondary | ICD-10-CM | POA: Insufficient documentation

## 2018-03-05 DIAGNOSIS — K210 Gastro-esophageal reflux disease with esophagitis: Secondary | ICD-10-CM | POA: Insufficient documentation

## 2018-03-05 DIAGNOSIS — E11641 Type 2 diabetes mellitus with hypoglycemia with coma: Secondary | ICD-10-CM | POA: Insufficient documentation

## 2018-03-05 DIAGNOSIS — Z880 Allergy status to penicillin: Secondary | ICD-10-CM | POA: Insufficient documentation

## 2018-03-05 DIAGNOSIS — Z1211 Encounter for screening for malignant neoplasm of colon: Principal | ICD-10-CM | POA: Insufficient documentation

## 2018-03-05 DIAGNOSIS — K2980 Duodenitis without bleeding: Secondary | ICD-10-CM | POA: Insufficient documentation

## 2018-03-05 DIAGNOSIS — N179 Acute kidney failure, unspecified: Secondary | ICD-10-CM | POA: Insufficient documentation

## 2018-03-05 DIAGNOSIS — F17210 Nicotine dependence, cigarettes, uncomplicated: Secondary | ICD-10-CM | POA: Insufficient documentation

## 2018-03-05 DIAGNOSIS — Z886 Allergy status to analgesic agent status: Secondary | ICD-10-CM | POA: Insufficient documentation

## 2018-03-05 HISTORY — DX: Personal history of other diseases of the digestive system: Z87.19

## 2018-03-05 HISTORY — PX: ESOPHAGOGASTRODUODENOSCOPY: SHX5428

## 2018-03-05 HISTORY — PX: COLONOSCOPY: SHX5424

## 2018-03-05 LAB — GLUCOSE, CAPILLARY
GLUCOSE-CAPILLARY: 129 mg/dL — AB (ref 70–99)
GLUCOSE-CAPILLARY: 229 mg/dL — AB (ref 70–99)
GLUCOSE-CAPILLARY: 298 mg/dL — AB (ref 70–99)
GLUCOSE-CAPILLARY: 359 mg/dL — AB (ref 70–99)
GLUCOSE-CAPILLARY: 453 mg/dL — AB (ref 70–99)
Glucose-Capillary: >600 mg/dL (ref 70–99)
Glucose-Capillary: 569 mg/dL (ref 70–99)
Glucose-Capillary: 441 mg/dL — ABNORMAL HIGH (ref 70–99)
Glucose-Capillary: 416 mg/dL — ABNORMAL HIGH (ref 70–99)
Glucose-Capillary: 315 mg/dL — ABNORMAL HIGH (ref 70–99)
Glucose-Capillary: 161 mg/dL — ABNORMAL HIGH (ref 70–99)

## 2018-03-05 LAB — URINALYSIS, ROUTINE W REFLEX MICROSCOPIC
Bacteria, UA: NONE SEEN
Bilirubin Urine: NEGATIVE
Hgb urine dipstick: NEGATIVE
Ketones, ur: 20 mg/dL — AB
Leukocytes, UA: NEGATIVE
Nitrite: NEGATIVE
PH: 6 (ref 5.0–8.0)
Protein, ur: 30 mg/dL — AB
Specific Gravity, Urine: 1.018 (ref 1.005–1.030)

## 2018-03-05 LAB — CBC
HEMATOCRIT: 42.9 % (ref 36.0–46.0)
HEMOGLOBIN: 14.5 g/dL (ref 12.0–15.0)
MCH: 31.2 pg (ref 26.0–34.0)
MCHC: 33.8 g/dL (ref 30.0–36.0)
MCV: 92.3 fL (ref 78.0–100.0)
Platelets: 188 10*3/uL (ref 150–400)
RBC: 4.65 MIL/uL (ref 3.87–5.11)
RDW: 12.3 % (ref 11.5–15.5)
WBC: 4.6 10*3/uL (ref 4.0–10.5)

## 2018-03-05 LAB — BASIC METABOLIC PANEL
ANION GAP: 16 — AB (ref 5–15)
BUN: 37 mg/dL — AB (ref 6–20)
CALCIUM: 8.8 mg/dL — AB (ref 8.9–10.3)
CHLORIDE: 90 mmol/L — AB (ref 98–111)
CO2: 23 mmol/L (ref 22–32)
Creatinine, Ser: 1.52 mg/dL — ABNORMAL HIGH (ref 0.44–1.00)
GFR calc Af Amer: 43 mL/min — ABNORMAL LOW (ref >60)
GFR, EST NON AFRICAN AMERICAN: 37 mL/min — AB (ref >60)
GLUCOSE: 710 mg/dL — AB (ref 70–99)
POTASSIUM: 4.2 mmol/L (ref 3.5–5.1)
SODIUM: 129 mmol/L — AB (ref 135–145)

## 2018-03-05 LAB — MRSA PCR SCREENING: MRSA BY PCR: NEGATIVE

## 2018-03-05 SURGERY — COLONOSCOPY
Anesthesia: Moderate Sedation

## 2018-03-05 MED ORDER — POLYVINYL ALCOHOL 1.4 % OP SOLN
1.0000 [drp] | Freq: Every day | OPHTHALMIC | Status: DC
  Administered 2018-03-05 – 2018-03-06 (×2): 1 [drp] via OPHTHALMIC
  Filled 2018-03-05: qty 15

## 2018-03-05 MED ORDER — INSULIN REGULAR BOLUS VIA INFUSION
0.0000 [IU] | Freq: Three times a day (TID) | INTRAVENOUS | Status: DC
  Filled 2018-03-05: qty 10

## 2018-03-05 MED ORDER — SODIUM CHLORIDE 0.9 % IV SOLN: INTRAVENOUS | Status: DC

## 2018-03-05 MED ORDER — ACETAMINOPHEN 325 MG PO TABS
650.0000 mg | ORAL_TABLET | Freq: Four times a day (QID) | ORAL | Status: DC | PRN
Start: 2018-03-05 — End: 2018-03-06
  Administered 2018-03-06: 650 mg via ORAL
  Filled 2018-03-05: qty 2

## 2018-03-05 MED ORDER — GLUCERNA SHAKE PO LIQD: 237.0000 mL | Freq: Three times a day (TID) | ORAL | Status: DC

## 2018-03-05 MED ORDER — DEXTROSE-NACL 5-0.45 % IV SOLN
INTRAVENOUS | Status: DC
  Administered 2018-03-05: 23:00:00 via INTRAVENOUS

## 2018-03-05 MED ORDER — INSULIN ASPART 100 UNIT/ML ~~LOC~~ SOLN
6.0000 [IU] | Freq: Once | SUBCUTANEOUS | Status: AC
  Administered 2018-03-05: 6 [IU] via INTRAVENOUS
  Filled 2018-03-05: qty 0.06

## 2018-03-05 MED ORDER — FAMOTIDINE 20 MG PO TABS
10.0000 mg | ORAL_TABLET | Freq: Every day | ORAL | Status: DC
  Administered 2018-03-05 – 2018-03-06 (×2): 10 mg via ORAL
  Filled 2018-03-05 (×2): qty 1

## 2018-03-05 MED ORDER — NYSTATIN 100000 UNIT/ML MT SUSP
5.0000 mL | Freq: Four times a day (QID) | OROMUCOSAL | Status: DC
  Administered 2018-03-05 – 2018-03-06 (×3): 500000 [IU] via ORAL
  Filled 2018-03-05 (×3): qty 5

## 2018-03-05 MED ORDER — PANTOPRAZOLE SODIUM 40 MG PO TBEC
40.0000 mg | DELAYED_RELEASE_TABLET | Freq: Every day | ORAL | Status: DC
  Administered 2018-03-06: 40 mg via ORAL
  Filled 2018-03-05: qty 1

## 2018-03-05 MED ORDER — ACETAMINOPHEN 650 MG RE SUPP: 650.0000 mg | Freq: Four times a day (QID) | RECTAL | Status: DC | PRN

## 2018-03-05 MED ORDER — SODIUM CHLORIDE 0.9 % IV SOLN
INTRAVENOUS | Status: DC
  Administered 2018-03-05: 3.8 [IU]/h via INTRAVENOUS
  Filled 2018-03-05: qty 1

## 2018-03-05 MED ORDER — DEXTROSE 50 % IV SOLN: 25.0000 mL | INTRAVENOUS | Status: DC | PRN

## 2018-03-05 MED ORDER — AMLODIPINE BESYLATE 5 MG PO TABS
10.0000 mg | ORAL_TABLET | Freq: Every day | ORAL | Status: DC
  Administered 2018-03-06: 10 mg via ORAL
  Filled 2018-03-05: qty 2

## 2018-03-05 MED ORDER — ONDANSETRON HCL 4 MG PO TABS
4.0000 mg | ORAL_TABLET | Freq: Four times a day (QID) | ORAL | Status: DC | PRN
Start: 2018-03-05 — End: 2018-03-06

## 2018-03-05 MED ORDER — SODIUM CHLORIDE 0.9 % IV SOLN
INTRAVENOUS | Status: DC
  Administered 2018-03-05 – 2018-03-06 (×2): via INTRAVENOUS

## 2018-03-05 MED ORDER — ONDANSETRON HCL 4 MG/2ML IJ SOLN: 4.0000 mg | Freq: Four times a day (QID) | INTRAMUSCULAR | Status: DC | PRN

## 2018-03-05 MED ORDER — SODIUM CHLORIDE 0.9 % IV SOLN
INTRAVENOUS | Status: DC
  Administered 2018-03-05: 12:00:00 via INTRAVENOUS

## 2018-03-05 MED ORDER — ATORVASTATIN CALCIUM 20 MG PO TABS
20.0000 mg | ORAL_TABLET | Freq: Every day | ORAL | Status: DC
  Administered 2018-03-05 – 2018-03-06 (×2): 20 mg via ORAL
  Filled 2018-03-05 (×2): qty 1

## 2018-03-05 NOTE — H&P (Signed)
History and Physical    Sydney Morrow JKK:[id number] DOB: 24-Feb-1961 DOA: 03/05/2018  PCP: Gildardo Pounds, NP  Patient coming from: home  I have personally briefly reviewed patient's old medical records in [place]  Chief Complaint: hyperglycemia  HPI: Sydney Morrow is a 57 y.o. female with medical history significant of HTN, DM2, hyperlipidemia, who had presented to [facility] for elective endoscopy and colonoscopy. Patient was undergoing endoscopy for surveillance of PUD and she was undergoing colonoscopy for colon cancer screening. On pre-procedure labs, she was noted to be severely hyperglycemic with blood sugar greater than 700. Patient reports that her blood sugars are normally well controlled and normally range around 120s. For the last 24-48 hours, in preparation for her procedures, she has been drinking liquids. Unfortunately, these liquids were not sugar free. She has been more thirsty lately, but denies any nausea, vomiting, dysuria or polyuria. No fever, cough, shortness of breath or chest pain. Since blood sugars were significantly elevated, she was referred for admission for stabilization prior to undergoing procedures  Review of Systems: As per HPI otherwise 10 point review of systems negative.    Past Medical History:  Diagnosis Date  . Acid reflux   . Diabetes ([place])   . History of anemia   . History of hiatal hernia   . Hypercholesterolemia   . Hypertension   . Jaw inflammation, right 01/30/2017    Past Surgical History:  Procedure Laterality Date  . DENTAL SURGERY    . ESOPHAGOGASTRODUODENOSCOPY  2011   white, raised, somewhat fixed plaques esophageal mucosa concerning for possible Candida esophagitis s/p brushing. Multiple gastric antral ulcerations, multiple small bulbar erosions and ulcerations.   . TOOTH EXTRACTION N/A 02/01/2017   Procedure: FULL DENTAL EXTRACTIONS;  Surgeon: Diona Browner, DDS;  Location: [facility];  Service: Oral Surgery;   Laterality: N/A;  . TUBAL LIGATION       reports that she has been smoking cigarettes.  She has a 7.50 pack-year smoking history. She has never used smokeless tobacco. She reports that she does not drink alcohol or use drugs.  Allergies  Allergen Reactions  . Aspirin Other (See Comments)    Mouth, upper lip, nose area becomes numb  . Morphine And Related Other (See Comments)    "Did not feel good"  . Penicillins Other (See Comments)    Unknown Has patient had a PCN reaction causing immediate rash, facial/tongue/throat swelling, SOB or lightheadedness with hypotension: Unknown Has patient had a PCN reaction causing severe rash involving mucus membranes or skin necrosis: Unknown Has patient had a PCN reaction that required hospitalization: No Has patient had a PCN reaction occurring within the last 10 years: No If all of the above answers are "NO", then may proceed with Cephalosporin use.     Family History  Problem Relation Age of Onset  . Breast cancer Mother   . Colon cancer Neg Hx   . Colon polyps Neg Hx     Prior to Admission medications   Medication Sig Start Date End Date Taking? Authorizing Provider  acetaminophen (TYLENOL) 500 MG tablet Take 1,000 mg by mouth every 6 (six) hours as needed for moderate pain or headache.   Yes [provider]  amLODipine (NORVASC) 10 MG tablet Take 1 tablet (10 mg total) by mouth daily. 07/03/17  Yes Hairston, Maylon Peppers, FNP  atorvastatin (LIPITOR) 20 MG tablet Take 1 tablet (20 mg total) by mouth daily. 02/09/18  Yes Gildardo Pounds, NP  Blood Glucose Monitoring Suppl (TRUE METRIX METER) DEVI 1 kit by Does not apply route 4 (four) times daily. 03/05/17  Yes Ena Dawley, Tiffany S, PA-C  cloNIDine (CATAPRES) 0.1 MG tablet Take 1 tablet (0.1 mg total) by mouth 2 (two) times daily. 07/17/17  Yes Hairston, Mandesia R, FNP  Coenzyme Q10 (CO Q 10 PO) Take 1 capsule by mouth daily.   Yes [provider]  docusate sodium (COLACE) 100 MG  capsule Take 100 mg by mouth daily as needed for mild constipation.   Yes [provider]  famotidine (PEPCID) 10 MG tablet Take 10 mg by mouth daily.   Yes [provider]  fluticasone (FLONASE) 50 MCG/ACT nasal spray Place 1 spray into both nostrils daily as needed for allergies or rhinitis. Patient taking differently: Place 1 spray into both nostrils 2 (two) times daily.  08/22/17  Yes Fredia Beets R, FNP  glucose blood (TRUE METRIX BLOOD GLUCOSE TEST) test strip Use as instructed 03/05/17  Yes Ena Dawley, Tiffany S, PA-C  hydroxypropyl methylcellulose / hypromellose (ISOPTO TEARS / GONIOVISC) 2.5 % ophthalmic solution Place 1 drop into both eyes daily.   Yes [provider]  ibuprofen (ADVIL,MOTRIN) 200 MG tablet Take 400-600 mg by mouth every 6 (six) hours as needed for headache or moderate pain.   Yes [provider]  loratadine (CLARITIN) 10 MG tablet Take 10 mg by mouth daily.   Yes [provider]  losartan-hydrochlorothiazide (HYZAAR) 100-25 MG tablet Take 1 tablet by mouth daily. 01/16/18  Yes Gildardo Pounds, NP  lovastatin (MEVACOR) 20 MG tablet Take 20 mg by mouth daily. 02/10/18  Yes [provider]  metFORMIN (GLUCOPHAGE) 500 MG tablet Take 1 tablet (500 mg total) by mouth 2 (two) times daily with a meal. 07/03/17  Yes Hairston, Mandesia R, FNP  metoprolol tartrate (LOPRESSOR) 50 MG tablet Take 1 tablet (50 mg total) by mouth 2 (two) times daily. 08/22/17  Yes Hairston, Maylon Peppers, FNP  Multiple Vitamins-Minerals (MULTIVITAMIN PO) Take 1 tablet by mouth daily.   Yes [provider]  Omega-3 Fatty Acids (FISH OIL) 1000 MG CAPS Take 1,000 mg by mouth 2 (two) times daily.    Yes [provider]  pantoprazole (PROTONIX) 40 MG tablet Take 1 tablet (40 mg total) by mouth daily. 30 minutes before breakfast 01/15/18  Yes Annitta Needs, NP  phenazopyridine (PYRIDIUM) 95 MG tablet Take 95 mg by mouth 3 (three) times daily as needed  for pain.   Yes [provider]  polyethylene glycol (MIRALAX / GLYCOLAX) packet Take 17 g by mouth daily as needed. 02/04/17  Yes Lavina Hamman, MD  TRUEPLUS LANCETS 28G MISC 28 g by Does not apply route 4 (four) times daily. 03/05/17   Brayton Caves, PA-C    Physical Exam: Vitals:   03/05/18 1139 03/05/18 1141 03/05/18 1621  BP: (!) 135/50 (!) 135/50   Pulse: (!) 57 (!) 57   Resp: 12 12   Temp: 97.8 F (36.6 C) 97.8 F (36.6 C) (!) 97.5 F (36.4 C)  TempSrc: Oral Oral Oral  SpO2: 100% 100%   Weight:  84.8 kg (187 lb) 74.8 kg (164 lb 14.5 oz)  Height: 5' 6"  (1.676 m) 5' 6"  (1.676 m) 5' 7"  (1.702 m)    Constitutional: NAD, calm, comfortable Vitals:   03/05/18 1139 03/05/18 1141 03/05/18 1621  BP: (!) 135/50 (!) 135/50   Pulse: (!) 57 (!) 57   Resp: 12 12   Temp: 97.8 F (  36.6 C) 97.8 F (36.6 C) (!) 97.5 F (36.4 C)  TempSrc: Oral Oral Oral  SpO2: 100% 100%   Weight:  84.8 kg (187 lb) 74.8 kg (164 lb 14.5 oz)  Height: 5' 6"  (1.676 m) 5' 6"  (1.676 m) 5' 7"  (1.702 m)   Eyes: PERRL, lids and conjunctivae normal ENMT: Mucous membranes are moist. Posterior pharynx clear of any exudate or lesions.Normal dentition. Oral thrush is present  Neck: normal, supple, no masses, no thyromegaly Respiratory: clear to auscultation bilaterally, no wheezing, no crackles. Normal respiratory effort. No accessory muscle use.  Cardiovascular: Regular rate and rhythm, no murmurs / rubs / gallops. No extremity edema. 2+ pedal pulses. No carotid bruits.  Abdomen: no tenderness, no masses palpated. No hepatosplenomegaly. Bowel sounds positive.  Musculoskeletal: no clubbing / cyanosis. No joint deformity upper and lower extremities. Good ROM, no contractures. Normal muscle tone.  Skin: no rashes, lesions, ulcers. No induration Neurologic: CN 2-12 grossly intact. Sensation intact, DTR normal. Strength 5/5 in all 4.  Psychiatric: Normal judgment and insight. Alert and oriented x 3. Normal  mood.    Labs on Admission: I have personally reviewed following labs and imaging studies  CBC: Recent Labs  Lab 03/05/18 1723  WBC 4.6  HGB 14.5  HCT 42.9  MCV 92.3  PLT 630   Basic Metabolic Panel: Recent Labs  Lab 03/05/18 1203  NA 129*  K 4.2  CL 90*  CO2 23  GLUCOSE 710*  BUN 37*  CREATININE 1.52*  CALCIUM 8.8*   GFR: Estimated Creatinine Clearance: 43.1 mL/min (A) (by C-G formula based on SCr of 1.52 mg/dL (H)). Liver Function Tests: No results for input(s): AST, ALT, ALKPHOS, BILITOT, PROT, ALBUMIN in the last 168 hours. No results for input(s): LIPASE, AMYLASE in the last 168 hours. No results for input(s): AMMONIA in the last 168 hours. Coagulation Profile: No results for input(s): INR, PROTIME in the last 168 hours. Cardiac Enzymes: No results for input(s): CKTOTAL, CKMB, CKMBINDEX, TROPONINI in the last 168 hours. BNP (last 3 results) No results for input(s): PROBNP in the last 8760 hours. HbA1C: No results for input(s): HGBA1C in the last 72 hours. CBG: Recent Labs  Lab 03/05/18 1148 03/05/18 1239 03/05/18 1402 03/05/18 1547 03/05/18 1653  GLUCAP >600* >600* 569* 453* 441*   Lipid Profile: No results for input(s): CHOL, HDL, LDLCALC, TRIG, CHOLHDL, LDLDIRECT in the last 72 hours. Thyroid Function Tests: No results for input(s): TSH, T4TOTAL, FREET4, T3FREE, THYROIDAB in the last 72 hours. Anemia Panel: No results for input(s): VITAMINB12, FOLATE, FERRITIN, TIBC, IRON, RETICCTPCT in the last 72 hours. Urine analysis:    Component Value Date/Time   COLORURINE STRAW (A) 03/05/2018 1230   APPEARANCEUR CLEAR 03/05/2018 1230   LABSPEC 1.018 03/05/2018 1230   PHURINE 6.0 03/05/2018 1230   GLUCOSEU >=500 (A) 03/05/2018 1230   HGBUR NEGATIVE 03/05/2018 1230   BILIRUBINUR NEGATIVE 03/05/2018 1230   BILIRUBINUR neg 04/09/2017 1021   KETONESUR 20 (A) 03/05/2018 1230   PROTEINUR 30 (A) 03/05/2018 1230   UROBILINOGEN 0.2 04/09/2017 1021   NITRITE  NEGATIVE 03/05/2018 1230   LEUKOCYTESUR NEGATIVE 03/05/2018 1230    Radiological Exams on Admission: No results found.  Assessment/Plan Active Problems:   Peptic ulcer   Mixed hyperlipidemia   Uncontrolled hypertension   Acute hyperglycemia   Hyperglycemia   Bradycardia   Hyponatremia   DM (diabetes mellitus), type 2, uncontrolled (HCC)   GERD (gastroesophageal reflux disease)     1. Uncontrolled diabetes with hyperglycemia.  Bicarb is in normal range and AG is 16. Will start the patient on IV hydration and insulin infusion. Check A1c. Hold metformin for now 2. HTN with bradycardia. Patient noted to have bradycardia in the 40s-50. Will hold metoprolol and clonidine for now. Continue amlodipine. 3. AKI. Likely related to dehydration. Will continue IV fluids and recheck labs in AM. Hold ARB/HCTZ 4. HLD. Continue on statin 5. Hyponatremia. Pseudohyponatremia related to hyperglycemia. This should correct as blood sugars correct 6. GERD/PUD. Continue H2 blockers. GI to perform EGD in AM 7. Oral thrush. Start on nystatin mouth wash   DVT prophylaxis: SCDs  Code Status: full code  Family Communication: discussed with family at the bedside  Disposition Plan: discharge home after EGD tomorrow  Consults called:   Admission status: observation, stepdown   Kathie Dike MD [facility] Pager [phone]  If 7PM-7AM, please contact night-coverage [email or web address] Password [facility]  03/05/2018, 5:53 PM

## 2018-03-05 NOTE — OR Nursing (Signed)
Patient blood sugar registered > 600. Dr. Oneida Alar notified 6 units novalog ordered and given , IV fluids given on pump.  Rechecked critical value with BMET.  Registered 710 blood glucose.  6 units ordered again and given .  Ordered to recheck in an hour and blood glucose registered 549 critical reading .  Dr. Oneida Alar notified and patient to be admitted for observation and will reschedule colonoscopy for tomorrow.

## 2018-03-05 NOTE — H&P (Addendum)
1204: pt seen in [facility]. FS BG > 600. STAT BMP ORDERED, 1L FLUID BOLUS, AND REGULAR INSULIN 6u IV x1 BMP RESULTED WITH BG 710 WITH Na 129. 6u REGULAR INSULIN GIVEN FOR A TOTAL OF 12 u IV @ 1315. FS BG @1400  570. PT REMAINED ALERT AND ORIENTED AND AMBULATORY DURING TIME IN PREOP. CALLED DR. Roderic Palau TO ADMIT 1423. DISCUSSED CASE 1426. ADMIT TO STEP DOWN UNIT W/ IV INSULIN GTT. PLAN FOR EGD/TCS ON JUL 25 IF BG < 300. IF NOT WILL AWAIT MORE IDEAL GLUCOSE CONTROL. CONTINUE CLEAR LIQUID DIET. NPO AFTER MN EXCEPT FOR SIPS WITH MEDS.   Primary Care Physician:  Gildardo Pounds, NP Primary Gastroenterologist:  Dr. Oneida Alar  Pre-Procedure History & Physical: HPI:  Sydney Morrow is a 57 y.o. female here for Surveillance FOR ulcers/COLON CANCER SCREENING.   Past Medical History:  Diagnosis Date  . Acid reflux   . Diabetes ([place])   . History of anemia   . History of hiatal hernia   . Hypercholesterolemia   . Hypertension   . Jaw inflammation, right 01/30/2017    Past Surgical History:  Procedure Laterality Date  . DENTAL SURGERY    . ESOPHAGOGASTRODUODENOSCOPY  2011   white, raised, somewhat fixed plaques esophageal mucosa concerning for possible Candida esophagitis s/p brushing. Multiple gastric antral ulcerations, multiple small bulbar erosions and ulcerations.   . TOOTH EXTRACTION N/A 02/01/2017   Procedure: FULL DENTAL EXTRACTIONS;  Surgeon: Diona Browner, DDS;  Location: [facility];  Service: Oral Surgery;  Laterality: N/A;  . TUBAL LIGATION      Prior to Admission medications   Medication Sig Start Date End Date Taking? Authorizing Provider  acetaminophen (TYLENOL) 500 MG tablet Take 1,000 mg by mouth every 6 (six) hours as needed for moderate pain or headache.   Yes [provider]  amLODipine (NORVASC) 10 MG tablet Take 1 tablet (10 mg total) by mouth daily. 07/03/17  Yes Hairston, Maylon Peppers, FNP  atorvastatin (LIPITOR) 20 MG tablet Take 1 tablet (20 mg total) by mouth daily. 02/09/18   Yes Gildardo Pounds, NP  Blood Glucose Monitoring Suppl (TRUE METRIX METER) DEVI 1 kit by Does not apply route 4 (four) times daily. 03/05/17  Yes Ena Dawley, Tiffany S, PA-C  cloNIDine (CATAPRES) 0.1 MG tablet Take 1 tablet (0.1 mg total) by mouth 2 (two) times daily. 07/17/17  Yes Hairston, Mandesia R, FNP  Coenzyme Q10 (CO Q 10 PO) Take 1 capsule by mouth daily.   Yes [provider]  docusate sodium (COLACE) 100 MG capsule Take 100 mg by mouth daily as needed for mild constipation.   Yes [provider]  famotidine (PEPCID) 10 MG tablet Take 10 mg by mouth daily.   Yes [provider]  fluticasone (FLONASE) 50 MCG/ACT nasal spray Place 1 spray into both nostrils daily as needed for allergies or rhinitis. Patient taking differently: Place 1 spray into both nostrils 2 (two) times daily.  08/22/17  Yes Fredia Beets R, FNP  glucose blood (TRUE METRIX BLOOD GLUCOSE TEST) test strip Use as instructed 03/05/17  Yes Ena Dawley, Tiffany S, PA-C  hydroxypropyl methylcellulose / hypromellose (ISOPTO TEARS / GONIOVISC) 2.5 % ophthalmic solution Place 1 drop into both eyes daily.   Yes [provider]  ibuprofen (ADVIL,MOTRIN) 200 MG tablet Take 400-600 mg by mouth every 6 (six) hours as needed for headache or moderate pain.   Yes [provider]  loratadine (CLARITIN) 10 MG tablet Take 10 mg by mouth daily.  Yes [provider]  losartan-hydrochlorothiazide (HYZAAR) 100-25 MG tablet Take 1 tablet by mouth daily. 01/16/18  Yes Gildardo Pounds, NP  lovastatin (MEVACOR) 20 MG tablet Take 20 mg by mouth daily. 02/10/18  Yes [provider]  metFORMIN (GLUCOPHAGE) 500 MG tablet Take 1 tablet (500 mg total) by mouth 2 (two) times daily with a meal. 07/03/17  Yes Hairston, Mandesia R, FNP  metoprolol tartrate (LOPRESSOR) 50 MG tablet Take 1 tablet (50 mg total) by mouth 2 (two) times daily. 08/22/17  Yes Hairston, Maylon Peppers, FNP  Multiple Vitamins-Minerals  (MULTIVITAMIN PO) Take 1 tablet by mouth daily.   Yes [provider]  Omega-3 Fatty Acids (FISH OIL) 1000 MG CAPS Take 1,000 mg by mouth 2 (two) times daily.    Yes [provider]  pantoprazole (PROTONIX) 40 MG tablet Take 1 tablet (40 mg total) by mouth daily. 30 minutes before breakfast 01/15/18  Yes Annitta Needs, NP  phenazopyridine (PYRIDIUM) 95 MG tablet Take 95 mg by mouth 3 (three) times daily as needed for pain.   Yes [provider]  polyethylene glycol (MIRALAX / GLYCOLAX) packet Take 17 g by mouth daily as needed. 02/04/17  Yes Lavina Hamman, MD  TRUEPLUS LANCETS 28G MISC 28 g by Does not apply route 4 (four) times daily. 03/05/17   Brayton Caves, PA-C    Allergies as of 01/15/2018 - Review Complete 01/15/2018  Allergen Reaction Noted  . Aspirin Other (See Comments)   . Morphine and related Other (See Comments) 10/23/2017  . Penicillins Other (See Comments) 01/29/2017    Family History  Problem Relation Age of Onset  . Breast cancer Mother   . Colon cancer Neg Hx   . Colon polyps Neg Hx     Social History   Socioeconomic History  . Marital status: Single    Spouse name: Not on file  . Number of children: Not on file  . Years of education: Not on file  . Highest education level: Not on file  Occupational History  . Not on file  Social Needs  . Financial resource strain: Not on file  . Food insecurity:    Worry: Not on file    Inability: Not on file  . Transportation needs:    Medical: Not on file    Non-medical: Not on file  Tobacco Use  . Smoking status: Current Every Day Smoker    Packs/day: 0.50    Years: 15.00    Pack years: 7.50    Types: Cigarettes  . Smokeless tobacco: Never Used  Substance and Sexual Activity  . Alcohol use: No  . Drug use: No  . Sexual activity: Not Currently  Lifestyle  . Physical activity:    Days per week: Not on file    Minutes per session: Not on file  . Stress: Not on file  Relationships   . Social connections:    Talks on phone: Not on file    Gets together: Not on file    Attends religious service: Not on file    Active member of club or organization: Not on file    Attends meetings of clubs or organizations: Not on file    Relationship status: Not on file  . Intimate partner violence:    Fear of current or ex partner: Not on file    Emotionally abused: Not on file    Physically abused: Not on file    Forced sexual activity: Not on file  Other Topics Concern  . Not on file  Social History Narrative  . Not on file    Review of Systems: See HPI, otherwise negative ROS   Physical Exam: BP (!) 135/50   Pulse (!) 57   Temp 97.8 F (36.6 C) (Oral)   Resp 12   Ht 5' 6"  (1.676 m)   Wt 187 lb (84.8 kg)   LMP 08/13/2014 (Approximate)   SpO2 100%   BMI 30.18 kg/m  General:   Alert,  pleasant and cooperative in NAD Head:  Normocephalic and atraumatic. Neck:  Supple; Lungs:  Clear throughout to auscultation.    Heart:  Regular rate and rhythm. Abdomen:  Soft, nontender and nondistended. Normal bowel sounds, without guarding, and without rebound.   Neurologic:  Alert and  oriented x4;  grossly normal neurologically.  Impression/Plan:    Surveillance FOR ulcers/COLON CANCER SCREENING.   PLAN: 1. TCS/EGD TODAY. DISCUSSED PROCEDURE, BENEFITS, & RISKS: < 1% chance of medication reaction, bleeding, perforation, or rupture of spleen/liver.

## 2018-03-06 ENCOUNTER — Encounter (HOSPITAL_COMMUNITY)
Admission: RE | Disposition: A | Discharge status: Home / Self Care | Payer: Self-pay | Source: Ambulatory Visit | Attending: Internal Medicine

## 2018-03-06 ENCOUNTER — Encounter (HOSPITAL_COMMUNITY): Payer: Self-pay | Admitting: Family Medicine

## 2018-03-06 ENCOUNTER — Other Ambulatory Visit: Payer: Self-pay

## 2018-03-06 DIAGNOSIS — K2970 Gastritis, unspecified, without bleeding: Secondary | ICD-10-CM

## 2018-03-06 DIAGNOSIS — Z1211 Encounter for screening for malignant neoplasm of colon: Principal | ICD-10-CM

## 2018-03-06 DIAGNOSIS — K2980 Duodenitis without bleeding: Secondary | ICD-10-CM

## 2018-03-06 DIAGNOSIS — K279 Peptic ulcer, site unspecified, unspecified as acute or chronic, without hemorrhage or perforation: Secondary | ICD-10-CM

## 2018-03-06 DIAGNOSIS — Z860100 Personal history of colon polyps, unspecified: Secondary | ICD-10-CM

## 2018-03-06 DIAGNOSIS — K210 Gastro-esophageal reflux disease with esophagitis: Secondary | ICD-10-CM

## 2018-03-06 DIAGNOSIS — Z8711 Personal history of peptic ulcer disease: Secondary | ICD-10-CM

## 2018-03-06 HISTORY — PX: ESOPHAGOGASTRODUODENOSCOPY: SHX5428

## 2018-03-06 HISTORY — PX: COLONOSCOPY: SHX5424

## 2018-03-06 LAB — HEMOGLOBIN A1C
Hgb A1c MFr Bld: 15.4 % — ABNORMAL HIGH (ref 4.8–5.6)
MEAN PLASMA GLUCOSE: 395.28 mg/dL

## 2018-03-06 LAB — GLUCOSE, CAPILLARY
GLUCOSE-CAPILLARY: 154 mg/dL — AB (ref 70–99)
GLUCOSE-CAPILLARY: 241 mg/dL — AB (ref 70–99)
GLUCOSE-CAPILLARY: 249 mg/dL — AB (ref 70–99)
GLUCOSE-CAPILLARY: 226 mg/dL — AB (ref 70–99)
GLUCOSE-CAPILLARY: 200 mg/dL — AB (ref 70–99)
Glucose-Capillary: 124 mg/dL — ABNORMAL HIGH (ref 70–99)
Glucose-Capillary: 129 mg/dL — ABNORMAL HIGH (ref 70–99)
Glucose-Capillary: 167 mg/dL — ABNORMAL HIGH (ref 70–99)
Glucose-Capillary: 231 mg/dL — ABNORMAL HIGH (ref 70–99)

## 2018-03-06 LAB — BASIC METABOLIC PANEL
Anion gap: 8 (ref 5–15)
BUN: 21 mg/dL — AB (ref 6–20)
CALCIUM: 8.6 mg/dL — AB (ref 8.9–10.3)
CHLORIDE: 101 mmol/L (ref 98–111)
CO2: 26 mmol/L (ref 22–32)
CREATININE: 1.09 mg/dL — AB (ref 0.44–1.00)
GFR, EST NON AFRICAN AMERICAN: 55 mL/min — AB (ref >60)
Glucose, Bld: 167 mg/dL — ABNORMAL HIGH (ref 70–99)
Potassium: 3.3 mmol/L — ABNORMAL LOW (ref 3.5–5.1)
SODIUM: 135 mmol/L (ref 135–145)

## 2018-03-06 LAB — URINE CULTURE: SPECIAL REQUESTS: NORMAL

## 2018-03-06 LAB — CBC
HEMATOCRIT: 42 % (ref 36.0–46.0)
HEMOGLOBIN: 14.1 g/dL (ref 12.0–15.0)
MCH: 30.9 pg (ref 26.0–34.0)
MCHC: 33.6 g/dL (ref 30.0–36.0)
MCV: 91.9 fL (ref 78.0–100.0)
Platelets: 192 10*3/uL (ref 150–400)
RBC: 4.57 MIL/uL (ref 3.87–5.11)
RDW: 12.4 % (ref 11.5–15.5)
WBC: 6.4 10*3/uL (ref 4.0–10.5)

## 2018-03-06 LAB — MAGNESIUM: MAGNESIUM: 2.1 mg/dL (ref 1.7–2.4)

## 2018-03-06 LAB — HIV ANTIBODY (ROUTINE TESTING W REFLEX): HIV SCREEN 4TH GENERATION: NONREACTIVE

## 2018-03-06 SURGERY — EGD (ESOPHAGOGASTRODUODENOSCOPY)
Anesthesia: Moderate Sedation

## 2018-03-06 MED ORDER — MEPERIDINE HCL 100 MG/ML IJ SOLN
INTRAMUSCULAR | Status: DC | PRN
  Administered 2018-03-06: 50 mg via INTRAVENOUS
  Administered 2018-03-06 (×2): 25 mg via INTRAVENOUS

## 2018-03-06 MED ORDER — LIDOCAINE VISCOUS HCL 2 % MT SOLN
OROMUCOSAL | Status: DC | PRN
  Administered 2018-03-06: 5 mL via OROMUCOSAL

## 2018-03-06 MED ORDER — METFORMIN HCL 500 MG PO TABS: 1000.0000 mg | ORAL_TABLET | Freq: Two times a day (BID) | ORAL | 3 refills | Status: DC

## 2018-03-06 MED ORDER — CLONIDINE HCL 0.1 MG PO TABS: 0.1000 mg | ORAL_TABLET | Freq: Two times a day (BID) | ORAL | Status: DC

## 2018-03-06 MED ORDER — PANTOPRAZOLE SODIUM 40 MG PO TBEC: 40.0000 mg | DELAYED_RELEASE_TABLET | Freq: Two times a day (BID) | ORAL | 3 refills | Status: DC

## 2018-03-06 MED ORDER — INSULIN GLARGINE 100 UNIT/ML ~~LOC~~ SOLN
12.0000 [IU] | Freq: Once | SUBCUTANEOUS | Status: AC
  Administered 2018-03-06: 12 [IU] via SUBCUTANEOUS
  Filled 2018-03-06: qty 0.12

## 2018-03-06 MED ORDER — INSULIN ASPART 100 UNIT/ML ~~LOC~~ SOLN: 0.0000 [IU] | Freq: Every day | SUBCUTANEOUS | Status: DC

## 2018-03-06 MED ORDER — INSULIN GLARGINE 100 UNIT/ML ~~LOC~~ SOLN
10.0000 [IU] | Freq: Every day | SUBCUTANEOUS | Status: DC
  Administered 2018-03-06: 10 [IU] via SUBCUTANEOUS
  Filled 2018-03-06 (×2): qty 0.1

## 2018-03-06 MED ORDER — HYDRALAZINE HCL 20 MG/ML IJ SOLN: 10.0000 mg | INTRAMUSCULAR | Status: DC | PRN

## 2018-03-06 MED ORDER — POTASSIUM CHLORIDE 10 MEQ/100ML IV SOLN
10.0000 meq | INTRAVENOUS | Status: AC
  Administered 2018-03-06 (×3): 10 meq via INTRAVENOUS
  Filled 2018-03-06 (×3): qty 100

## 2018-03-06 MED ORDER — INSULIN ASPART 100 UNIT/ML ~~LOC~~ SOLN
0.0000 [IU] | Freq: Three times a day (TID) | SUBCUTANEOUS | Status: DC
  Administered 2018-03-06: 3 [IU] via SUBCUTANEOUS

## 2018-03-06 MED ORDER — MIDAZOLAM HCL 5 MG/5ML IJ SOLN
INTRAMUSCULAR | Status: AC
  Filled 2018-03-06: qty 10

## 2018-03-06 MED ORDER — PANTOPRAZOLE SODIUM 40 MG PO TBEC: 40.0000 mg | DELAYED_RELEASE_TABLET | Freq: Two times a day (BID) | ORAL | Status: DC

## 2018-03-06 MED ORDER — INSULIN ASPART 100 UNIT/ML ~~LOC~~ SOLN: 0.0000 [IU] | Freq: Three times a day (TID) | SUBCUTANEOUS | Status: DC

## 2018-03-06 MED ORDER — METOPROLOL TARTRATE 25 MG PO TABS: 25.0000 mg | ORAL_TABLET | Freq: Two times a day (BID) | ORAL | Status: DC

## 2018-03-06 MED ORDER — INSULIN ASPART 100 UNIT/ML ~~LOC~~ SOLN
0.0000 [IU] | SUBCUTANEOUS | Status: DC
  Administered 2018-03-06: 3 [IU] via SUBCUTANEOUS

## 2018-03-06 MED ORDER — INSULIN ASPART 100 UNIT/ML ~~LOC~~ SOLN
0.0000 [IU] | Freq: Three times a day (TID) | SUBCUTANEOUS | Status: DC
  Administered 2018-03-06: 2 [IU] via SUBCUTANEOUS

## 2018-03-06 MED ORDER — LIVING WELL WITH DIABETES BOOK
Freq: Once | Status: AC
  Administered 2018-03-06: 17:00:00
  Filled 2018-03-06 (×2): qty 1

## 2018-03-06 MED ORDER — LIDOCAINE VISCOUS HCL 2 % MT SOLN
OROMUCOSAL | Status: AC
  Filled 2018-03-06: qty 15

## 2018-03-06 MED ORDER — MEPERIDINE HCL 100 MG/ML IJ SOLN
INTRAMUSCULAR | Status: AC
  Filled 2018-03-06: qty 2

## 2018-03-06 MED ORDER — STERILE WATER FOR IRRIGATION IR SOLN
Status: DC | PRN
  Administered 2018-03-06: 2.5 mL

## 2018-03-06 MED ORDER — MIDAZOLAM HCL 5 MG/5ML IJ SOLN
INTRAMUSCULAR | Status: DC | PRN
  Administered 2018-03-06: 1 mg via INTRAVENOUS
  Administered 2018-03-06: 2 mg via INTRAVENOUS
  Administered 2018-03-06 (×2): 1 mg via INTRAVENOUS
  Administered 2018-03-06: 2 mg via INTRAVENOUS

## 2018-03-06 NOTE — Discharge Instructions (Signed)
Your diabetes is out of control and you need to see your primary care clinic as soon as possible.  If you don't get your diabetes under better control you are at [facility] for serious medical complications.   Please go and see your primary care clinic as soon as possible in the next week.  You need to get started on insulin as soon as possible.   PLEASE STOP SMOKING.    If you wish to quit smoking, help is available.  For free tobacco cessation program offerings call the [facility] at (432)815-9546 or Live Well Line at [phone]. You may also visit [email or web address] or email [email or web address]  for more information on other programs.   Colonoscopy, Adult, Care After This sheet gives you information about how to care for yourself after your procedure. Your doctor may also give you more specific instructions. If you have problems or questions, call your doctor. Follow these instructions at home: General instructions   For the first 24 hours after the procedure: ? Do not drive or use machinery. ? Do not sign important documents. ? Do not drink alcohol. ? Do your daily activities more slowly than normal. ? Eat foods that are soft and easy to digest. ? Rest often.  Take over-the-counter or prescription medicines only as told by your doctor.  It is up to you to get the results of your procedure. Ask your doctor, or the department performing the procedure, when your results will be ready. To help cramping and bloating:  Try walking around.  Put heat on your belly (abdomen) as told by your doctor. Use a heat source that your doctor recommends, such as a moist heat pack or a heating pad. ? Put a towel between your skin and the heat source. ? Leave the heat on for 20-30 minutes. ? Remove the heat if your skin turns bright red. This is especially important if you cannot feel pain, heat, or cold. You can get burned. Eating and drinking  Drink enough fluid to keep  your pee (urine) clear or pale yellow.  Return to your normal diet as told by your doctor. Avoid heavy or fried foods that are hard to digest.  Avoid drinking alcohol for as long as told by your doctor. Contact a doctor if:  You have blood in your poop (stool) 2-3 days after the procedure. Get help right away if:  You have more than a small amount of blood in your poop.  You see large clumps of tissue (blood clots) in your poop.  Your belly is swollen.  You feel sick to your stomach (nauseous).  You throw up (vomit).  You have a fever.  You have belly pain that gets worse, and medicine does not help your pain. This information is not intended to replace advice given to you by your health care provider. Make sure you discuss any questions you have with your health care provider. Document Released: 09/01/2010 Document Revised: 04/23/2016 Document Reviewed: 04/23/2016 Elsevier Interactive Patient Education  2017 [employer].    Steps to Quit Smoking Smoking tobacco can be harmful to your health and can affect almost every organ in your body. Smoking puts you, and those around you, at risk for developing many serious chronic diseases. Quitting smoking is difficult, but it is one of the best things that you can do for your health. It is never too late to quit. What are the benefits of quitting smoking? When you quit smoking, you  lower your risk of developing serious diseases and conditions, such as:  Lung cancer or lung disease, such as COPD.  Heart disease.  Stroke.  Heart attack.  Infertility.  Osteoporosis and bone fractures.  Additionally, symptoms such as coughing, wheezing, and shortness of breath may get better when you quit. You may also find that you get sick less often because your body is stronger at fighting off colds and infections. If you are pregnant, quitting smoking can help to reduce your chances of having a baby of low birth weight. How do I get ready to  quit? When you decide to quit smoking, create a plan to make sure that you are successful. Before you quit:  Pick a date to quit. Set a date within the next two weeks to give you time to prepare.  Write down the reasons why you are quitting. Keep this list in places where you will see it often, such as on your bathroom mirror or in your car or wallet.  Identify the people, places, things, and activities that make you want to smoke (triggers) and avoid them. Make sure to take these actions: ? Throw away all cigarettes at home, at work, and in your car. ? Throw away smoking accessories, such as Scientist, research (medical). ? Clean your car and make sure to empty the ashtray. ? Clean your home, including curtains and carpets.  Tell your family, friends, and coworkers that you are quitting. Support from your loved ones can make quitting easier.  Talk with your health care provider about your options for quitting smoking.  Find out what treatment options are covered by your health insurance.  What strategies can I use to quit smoking? Talk with your healthcare provider about different strategies to quit smoking. Some strategies include:  Quitting smoking altogether instead of gradually lessening how much you smoke over a period of time. Research shows that quitting cold [place] is more successful than gradually quitting.  Attending in-person counseling to help you build problem-solving skills. You are more likely to have success in quitting if you attend several counseling sessions. Even short sessions of 10 minutes can be effective.  Finding resources and support systems that can help you to quit smoking and remain smoke-free after you quit. These resources are most helpful when you use them often. They can include: ? Online chats with a Social worker. ? Telephone quitlines. ? Careers information officer. ? Support groups or group counseling. ? Text messaging programs. ? Mobile phone  applications.  Taking medicines to help you quit smoking. (If you are pregnant or breastfeeding, talk with your health care provider first.) Some medicines contain nicotine and some do not. Both types of medicines help with cravings, but the medicines that include nicotine help to relieve withdrawal symptoms. Your health care provider may recommend: ? Nicotine patches, gum, or lozenges. ? Nicotine inhalers or sprays. ? Non-nicotine medicine that is taken by mouth.  Talk with your health care provider about combining strategies, such as taking medicines while you are also receiving in-person counseling. Using these two strategies together makes you more likely to succeed in quitting than if you used either strategy on its own. If you are pregnant or breastfeeding, talk with your health care provider about finding counseling or other support strategies to quit smoking. Do not take medicine to help you quit smoking unless told to do so by your health care provider. What things can I do to make it easier to quit? Quitting smoking might feel  overwhelming at first, but there is a lot that you can do to make it easier. Take these important actions:  Reach out to your family and friends and ask that they support and encourage you during this time. Call telephone quitlines, reach out to support groups, or work with a counselor for support.  Ask people who smoke to avoid smoking around you.  Avoid places that trigger you to smoke, such as bars, parties, or smoke-break areas at work.  Spend time around people who do not smoke.  Lessen stress in your life, because stress can be a smoking trigger for some people. To lessen stress, try: ? Exercising regularly. ? Deep-breathing exercises. ? Yoga. ? Meditating. ? Performing a body scan. This involves closing your eyes, scanning your body from head to toe, and noticing which parts of your body are particularly tense. Purposefully relax the muscles in those  areas.  Download or purchase mobile phone or tablet apps (applications) that can help you stick to your quit plan by providing reminders, tips, and encouragement. There are many free apps, such as QuitGuide from the [employer] Office manager for Disease Control and Prevention). You can find other support for quitting smoking (smoking cessation) through smokefree.gov and other websites.  How will I feel when I quit smoking? Within the first 24 hours of quitting smoking, you may start to feel some withdrawal symptoms. These symptoms are usually most noticeable 2-3 days after quitting, but they usually do not last beyond 2-3 weeks. Changes or symptoms that you might experience include:  Mood swings.  Restlessness, anxiety, or irritation.  Difficulty concentrating.  Dizziness.  Strong cravings for sugary foods in addition to nicotine.  Mild weight gain.  Constipation.  Nausea.  Coughing or a sore throat.  Changes in how your medicines work in your body.  A depressed mood.  Difficulty sleeping (insomnia).  After the first 2-3 weeks of quitting, you may start to notice more positive results, such as:  Improved sense of smell and taste.  Decreased coughing and sore throat.  Slower heart rate.  Lower blood pressure.  Clearer skin.  The ability to breathe more easily.  Fewer sick days.  Quitting smoking is very challenging for most people. Do not get discouraged if you are not successful the first time. Some people need to make many attempts to quit before they achieve long-term success. Do your best to stick to your quit plan, and talk with your health care provider if you have any questions or concerns. This information is not intended to replace advice given to you by your health care provider. Make sure you discuss any questions you have with your health care provider. Document Released: 07/24/2001 Document Revised: 03/27/2016 Document Reviewed: 12/14/2014 Elsevier Interactive Patient  Education  2018 [place] with Quitting Smoking Quitting smoking is a physical and mental challenge. You will face cravings, withdrawal symptoms, and temptation. Before quitting, work with your health care provider to make a plan that can help you cope. Preparation can help you quit and keep you from giving in. How can I cope with cravings? Cravings usually last for 5-10 minutes. If you get through it, the craving will pass. Consider taking the following actions to help you cope with cravings:  Keep your mouth busy: ? Chew sugar-free gum. ? Suck on hard candies or a straw. ? Brush your teeth.  Keep your hands and body busy: ? Immediately change to a different activity when you feel a craving. ?  Squeeze or play with a ball. ? Do an activity or a hobby, like making bead jewelry, practicing needlepoint, or working with wood. ? Mix up your normal routine. ? Take a short exercise break. Go for a quick walk or run up and down stairs. ? Spend time in public places where smoking is not allowed.  Focus on doing something kind or helpful for someone else.  Call a friend or family member to talk during a craving.  Join a support group.  Call a quit line, such as 1-800-QUIT-NOW.  Talk with your health care provider about medicines that might help you cope with cravings and make quitting easier for you.  How can I deal with withdrawal symptoms? Your body may experience negative effects as it tries to get used to not having nicotine in the system. These effects are called withdrawal symptoms. They may include:  Feeling hungrier than normal.  Trouble concentrating.  Irritability.  Trouble sleeping.  Feeling depressed.  Restlessness and agitation.  Craving a cigarette.  To manage withdrawal symptoms:  Avoid places, people, and activities that trigger your cravings.  Remember why you want to quit.  Get plenty of sleep.  Avoid coffee and other caffeinated drinks.  These may worsen some of your symptoms.  How can I handle social situations? Social situations can be difficult when you are quitting smoking, especially in the first few weeks. To manage this, you can:  Avoid parties, bars, and other social situations where people might be smoking.  Avoid alcohol.  Leave right away if you have the urge to smoke.  Explain to your family and friends that you are quitting smoking. Ask for understanding and support.  Plan activities with friends or family where smoking is not an option.  What are some ways I can cope with stress? Wanting to smoke may cause stress, and stress can make you want to smoke. Find ways to manage your stress. Relaxation techniques can help. For example:  Breathe slowly and deeply, in through your nose and out through your mouth.  Listen to soothing, relaxing music.  Talk with a family member or friend about your stress.  Light a candle.  Soak in a bath or take a shower.  Think about a peaceful place.  What are some ways I can prevent weight gain? Be aware that many people gain weight after they quit smoking. However, not everyone does. To keep from gaining weight, have a plan in place before you quit and stick to the plan after you quit. Your plan should include:  Having healthy snacks. When you have a craving, it may help to: ? Eat plain popcorn, crunchy carrots, celery, or other cut vegetables. ? Chew sugar-free gum.  Changing how you eat: ? Eat small portion sizes at meals. ? Eat 4-6 small meals throughout the day instead of 1-2 large meals a day. ? Be mindful when you eat. Do not watch television or do other things that might distract you as you eat.  Exercising regularly: ? Make time to exercise each day. If you do not have time for a long workout, do short bouts of exercise for 5-10 minutes several times a day. ? Do some form of strengthening exercise, like weight lifting, and some form of aerobic exercise,  like running or swimming.  Drinking plenty of water or other low-calorie or no-calorie drinks. Drink 6-8 glasses of water daily, or as much as instructed by your health care provider.  Summary  Quitting smoking is  a physical and mental challenge. You will face cravings, withdrawal symptoms, and temptation to smoke again. Preparation can help you as you go through these challenges.  You can cope with cravings by keeping your mouth busy (such as by chewing gum), keeping your body and hands busy, and making calls to family, friends, or a helpline for people who want to quit smoking.  You can cope with withdrawal symptoms by avoiding places where people smoke, avoiding drinks with caffeine, and getting plenty of rest.  Ask your health care provider about the different ways to prevent weight gain, avoid stress, and handle social situations. This information is not intended to replace advice given to you by your health care provider. Make sure you discuss any questions you have with your health care provider. Document Released: 07/27/2016 Document Revised: 07/27/2016 Document Reviewed: 07/27/2016 Elsevier Interactive Patient Education  [employer].   If you wish to quit smoking, help is available.  For free tobacco cessation program offerings call the [facility] at ([phone]) [phone] or Live Well Line at [phone]. You may also visit [email or web address] or email [email or web address]  for more information on other programs.   Insulin Treatment for Diabetes Diabetes (diabetes mellitus) is a long-term (chronic) disease. It occurs when the body does not properly use sugar (glucose) that is released from food after digestion. Glucose levels are controlled by a hormone called insulin, which is made in the pancreas.  If you have type 1 diabetes, the pancreas does not make any insulin, so you must take insulin.  If you have type 2 diabetes, you might need to take insulin  along with other medicines. In type 2 diabetes, one or both of these problems may be present: ? The pancreas does not make enough insulin. ? Cells in the body do not respond properly to insulin that the body makes (insulin resistance).  You must use insulin correctly to control your diabetes. You must have some insulin in your body at all times. Insulin treatment varies depending on your type of diabetes, your treatment goals, and your medical history. It is important for you to understand your insulin treatment plan so you can be an active partner in managing your diabetes. How is insulin given? Insulin can only be given through a shot (injection). It is injected using a syringe and needle, an insulin pen, a pump, or a jet injector. Your health care provider will:  Prescribe the amount and type of insulin that you need.  Tell you when you should inject your insulin.  Where on the body should insulin be injected? Insulin is injected into a layer of fatty tissue under the skin. Good places to inject insulin include:  Abdomen. Generally, the abdomen is the best place to inject insulin. However, you should avoid any area that is less than 2 inches (5 cm) from the belly button (navel).  Front and outer area of the upper thighs.  The back of the upper arms.  Upper buttocks.  It is important to:  Give your injection in a slightly different place each time. This helps to prevent irritation and improve absorption.  Avoid injecting into areas that have scar tissue.  Usually, you will give yourself insulin injections. Others can also be taught how to give you injections. You will use a special type of syringe that is made only for insulin. Some people may have an insulin pump that delivers insulin steadily through a tube (cannula) that is placed under the skin.  What are the different types of insulin? The following information is a general guide to different types of insulin. Specifics vary  depending on the insulin product that your health care provider prescribes.  Rapid-acting insulin: ? Starts working quickly, in as little as 5 minutes. ? Can last for 4-6 hours, or sometimes longer. ? Works well when taken right before a meal to quickly lower blood glucose.  Short-acting insulin: ? Starts working in about 30 minutes. ? Can last for 6-10 hours. ? Should be taken about 30 minutes before you start eating a meal.  Intermediate-acting insulin: ? Starts working in 1-2 hours. ? Lasts for about 10-18 hours. ? Lowers your blood glucose for a longer period of time but is not as effective for lowering blood glucose right after a meal.  Long-acting insulin: ? Mimics the small amount of insulin that your pancreas usually produces throughout the day. ? Should be used either one or two times a day. ? Is usually used in combination with other types of insulin or other medicines.  Concentrated insulin, or U-500 insulin: ? Contains a higher dose of insulin than most rapid-acting insulins. U-500 insulin has 5 times the amount of insulin per 1 mL. ? Should only be used with the special U-500 syringe or U-500 insulin pen. It is dangerous to use the wrong type of syringe with this insulin.  What are the side effects of insulin? Possible side effects of insulin treatment include:  Low blood glucose (hypoglycemia).  Weight gain.  High blood glucose (hyperglycemia).  Skin injury or irritation.  Some of these side effects can be caused by using improper injection technique. It is important to learn to inject insulin properly. What are common terms associated with insulin treatment? Some terms that you might hear include:  Basal insulin, or basal rate. This is the constant amount of insulin that needs to be present in your body to stabilize your blood glucose levels. People who have type 1 diabetes need basal insulin in a steady (continuous) dose 24 hours a day. ? Usually,  intermediate-acting or long-acting insulin is used one or two times a day to manage basal insulin levels. ? Medicines that are taken by mouth may also be recommended to manage basal insulin levels.  Prandial insulin. This refers to meal-related insulin. ? Blood glucose rises quickly after a meal (postprandial). Rapid-acting or short-acting insulin can be used right before a meal (preprandial) to quickly lower blood glucose. ? You may be instructed to adjust the amount of prandial insulin that you take depending on how much carbohydrate (starch) is in your meal.  Corrective insulin. This may also be called a correction dose or supplemental dose. This is a small amount of rapid-acting or short-acting insulin that can be used to lower blood glucose if it is too high. You may be instructed to check your blood glucose at certain times of the day and use corrective insulin as needed.  Tight control, or intensive therapy. This means keeping your blood glucose as close to your target as possible, and preventing it from getting too high after meals. People who have tight control of their diabetes have fewer long-term problems caused by diabetes.  General instructions  Talk with your health care provider or pharmacist about the type of insulin you should take and when you should take it. You should know when your insulin peaks and when it wears off. You need this information so you can plan your meals and exercise. You also  need to work with your health care provider to:  Check your blood glucose every day. Your health care provider will tell you how often and when you should do this.  Manage your: ? Weight. ? Blood pressure. ? Cholesterol. ? Stress.  Eat a healthy diet.  Exercise regularly.  This information is not intended to replace advice given to you by your health care provider. Make sure you discuss any questions you have with your health care provider. Document Released: 10/26/2008 Document  Revised: 01/05/2016 Document Reviewed: 09/02/2015 Elsevier Interactive Patient Education  2018 [place].   Blood Glucose Monitoring, Adult Monitoring your blood sugar (glucose) helps you manage your diabetes. It also helps you and your health care provider determine how well your diabetes management plan is working. Blood glucose monitoring involves checking your blood glucose as often as directed, and keeping a record (log) of your results over time. Why should I monitor my blood glucose? Checking your blood glucose regularly can:  Help you understand how food, exercise, illnesses, and medicines affect your blood glucose.  Let you know what your blood glucose is at any time. You can quickly tell if you are having low blood glucose (hypoglycemia) or high blood glucose (hyperglycemia).  Help you and your health care provider adjust your medicines as needed.  When should I check my blood glucose? Follow instructions from your health care provider about how often to check your blood glucose. This may depend on:  The type of diabetes you have.  How well-controlled your diabetes is.  Medicines you are taking.  If you have type 1 diabetes:  Check your blood glucose at least 2 times a day.  Also check your blood glucose: ? Before every insulin injection. ? Before and after exercise. ? Between meals. ? 2 hours after a meal. ? Occasionally between 2:00 a.m. and 3:00 a.m., as directed. ? Before potentially dangerous tasks, like driving or using heavy machinery. ? At bedtime.  You may need to check your blood glucose more often, up to 6-10 times a day: ? If you use an insulin pump. ? If you need multiple daily injections (MDI). ? If your diabetes is not well-controlled. ? If you are ill. ? If you have a history of severe hypoglycemia. ? If you have a history of not knowing when your blood glucose is getting low (hypoglycemia unawareness). If you have type 2 diabetes:  If you  take insulin or other diabetes medicines, check your blood glucose at least 2 times a day.  If you are on intensive insulin therapy, check your blood glucose at least 4 times a day. Occasionally, you may also need to check between 2:00 a.m. and 3:00 a.m., as directed.  Also check your blood glucose: ? Before and after exercise. ? Before potentially dangerous tasks, like driving or using heavy machinery.  You may need to check your blood glucose more often if: ? Your medicine is being adjusted. ? Your diabetes is not well-controlled. ? You are ill. What is a blood glucose log?  A blood glucose log is a record of your blood glucose readings. It helps you and your health care provider: ? Look for patterns in your blood glucose over time. ? Adjust your diabetes management plan as needed.  Every time you check your blood glucose, write down your result and notes about things that may be affecting your blood glucose, such as your diet and exercise for the day.  Most glucose meters store a record  of glucose readings in the meter. Some meters allow you to download your records to a computer. How do I check my blood glucose? Follow these steps to get accurate readings of your blood glucose: Supplies needed   Blood glucose meter.  Test strips for your meter. Each meter has its own strips. You must use the strips that come with your meter.  A needle to prick your finger (lancet). Do not use lancets more than once.  A device that holds the lancet (lancing device).  A journal or log book to write down your results. Procedure  Wash your hands with soap and water.  Prick the side of your finger (not the tip) with the lancet. Use a different finger each time.  Gently rub the finger until a small drop of blood appears.  Follow instructions that come with your meter for inserting the test strip, applying blood to the strip, and using your blood glucose meter.  Write down your result and  any notes. Alternative testing sites  Some meters allow you to use areas of your body other than your finger (alternative sites) to test your blood.  If you think you may have hypoglycemia, or if you have hypoglycemia unawareness, do not use alternative sites. Use your finger instead.  Alternative sites may not be as accurate as the fingers, because blood flow is slower in these areas. This means that the result you get may be delayed, and it may be different from the result that you would get from your finger.  The most common alternative sites are: ? Forearm. ? Thigh. ? Palm of the hand. Additional tips  Always keep your supplies with you.  If you have questions or need help, all blood glucose meters have a 24-hour hotline number that you can call. You may also contact your health care provider.  After you use a few boxes of test strips, adjust (calibrate) your blood glucose meter by following instructions that came with your meter. This information is not intended to replace advice given to you by your health care provider. Make sure you discuss any questions you have with your health care provider. Document Released: 08/02/2003 Document Revised: 02/17/2016 Document Reviewed: 01/09/2016 Elsevier Interactive Patient Education  2017 [employer].

## 2018-03-06 NOTE — Op Note (Signed)
[facility] Patient Name: Sydney Morrow Procedure Date: 03/06/2018 3:43 PM MRN: [id number] Date of Birth: 04-Sep-1960 Attending MD: Barney Drain MD, MD CSN: [id number] Age: 57 Admit Type: Inpatient Procedure:                Upper GI endoscopy with COLD FORCEPS BIOPSY Indications:              Personal history of peptic ulcer disease Providers:                Barney Drain MD, MD, Rometta Emery RN, RN,                            Randa Spike, Technician Referring MD:             Gildardo Pounds Medicines:                TCS + Meperidine 25 mg IV, Midazolam 2 mg IV Complications:            No immediate complications. Estimated Blood Loss:     Estimated blood loss was minimal. Procedure:                Pre-Anesthesia Assessment:                           - Prior to the procedure, a History and Physical                            was performed, and patient medications and                            allergies were reviewed. The patient's tolerance of                            previous anesthesia was also reviewed. The risks                            and benefits of the procedure and the sedation                            options and risks were discussed with the patient.                            All questions were answered, and informed consent                            was obtained. Prior Anticoagulants: The patient has                            taken no previous anticoagulant or antiplatelet                            agents. ASA Grade Assessment: II - A patient with                            mild systemic disease. After reviewing the risks  and benefits, the patient was deemed in                            satisfactory condition to undergo the procedure.                            After obtaining informed consent, the endoscope was                            passed under direct vision. Throughout the                            procedure, the  patient's blood pressure, pulse, and                            oxygen saturations were monitored continuously. The                            [id number]-[id number] ([id number]) was introduced through the                            mouth, and advanced to the second part of duodenum.                            The upper GI endoscopy was somewhat difficult due                            to the patient's agitation. Successful completion                            of the procedure was aided by increasing the dose                            of sedation medication. The patient tolerated the                            procedure fairly well. Scope In: 3:48:18 PM Scope Out: 3:59:44 PM Total Procedure Duration: 0 hours 11 minutes 26 seconds  Findings:      LA Grade A (one or more mucosal breaks less than 5 mm, not extending       between tops of 2 mucosal folds) esophagitis with no bleeding was found.      A medium-sized hiatal hernia was present.      Diffuse moderate inflammation characterized by congestion (edema) and       erythema was found in the entire examined stomach. Biopsies were taken       with a cold forceps for histology(2: BODY, 3: ANTRUM).      Patchy moderate inflammation characterized by congestion (edema) and       erythema was found in the entire duodenum. Impression:               - LA Grade A reflux esophagitis.                           - Medium-sized hiatal  hernia.                           - MODERATE Gastritis/Duodenitis-NO PUD Moderate Sedation:      Moderate (conscious) sedation was administered by the endoscopy nurse       and supervised by the endoscopist. The following parameters were       monitored: oxygen saturation, heart rate, blood pressure, and response       to care. Total physician intraservice time was 49 minutes. Recommendation:           - Return patient to hospital ward.                           - High fiber diet and diabetic (ADA) diet.                           -  Continue present medications.                           - Await pathology results.                           - Use Protonix (pantoprazole) 40 mg PO BID.                           - Return to my office in 4 months. Procedure Code(s):        --- Professional ---                           [phone], Esophagogastroduodenoscopy, flexible,                            transoral; with biopsy, single or multiple                           G0500, Moderate sedation services provided by the                            same physician or other qualified health care                            professional performing a gastrointestinal                            endoscopic service that sedation supports,                            requiring the presence of an independent trained                            observer to assist in the monitoring of the                            patient's level of consciousness and physiological  status; initial 15 minutes of intra-service time;                            patient age 54 years or older (additional time may                            be reported with [phone], as appropriate)                           [phone], Moderate sedation services provided by the                            same physician or other qualified health care                            professional performing the diagnostic or                            therapeutic service that the sedation supports,                            requiring the presence of an independent trained                            observer to assist in the monitoring of the                            patient's level of consciousness and physiological                            status; each additional 15 minutes intraservice                            time (List separately in addition to code for                            primary service)                           [phone], Moderate sedation services provided by the                             same physician or other qualified health care                            professional performing the diagnostic or                            therapeutic service that the sedation supports,                            requiring the presence of an independent trained  observer to assist in the monitoring of the                            patient's level of consciousness and physiological                            status; each additional 15 minutes intraservice                            time (List separately in addition to code for                            primary service) Diagnosis Code(s):        --- Professional ---                           K21.0, Gastro-esophageal reflux disease with                            esophagitis                           K44.9, Diaphragmatic hernia without obstruction or                            gangrene                           K29.70, Gastritis, unspecified, without bleeding                           K29.80, Duodenitis without bleeding                           Z87.11, Personal history of peptic ulcer disease CPT copyright 2017 American [employer]. All rights reserved. The codes documented in this report are preliminary and upon coder review may  be revised to meet current compliance requirements. Barney Drain, MD Barney Drain MD, MD 03/06/2018 4:14:49 PM This report has been signed electronically. Number of Addenda: 0

## 2018-03-06 NOTE — Progress Notes (Signed)
Report given from [place]. Pt resting in room and now on NS.

## 2018-03-06 NOTE — Interval H&P Note (Signed)
History and Physical Interval Note:  03/06/2018 1:42 PM  Sydney Morrow  has presented today for surgery, with the diagnosis of History of peptic ulcer disease/ screening colonoscopy  The various methods of treatment have been discussed with the patient and family. After consideration of risks, benefits and other options for treatment, the patient has consented to  Procedure(s): ESOPHAGOGASTRODUODENOSCOPY (EGD) (N/A) COLONOSCOPY (N/A) as a surgical intervention .  The patient's history has been reviewed, patient examined, no change in status, stable for surgery.  I have reviewed the patient's chart and labs.  Questions were answered to the patient's satisfaction.     [employer]

## 2018-03-06 NOTE — Progress Notes (Signed)
Dr. Darrick Meigs  Paged and made aware of pt being on insulin drip at hyperglycemia protocol- last four blood sugars <180. New order for Lantus 10 units, SSI on sensitive scale with blood sugars ac&hs and d/c insulin gtt 2 hours after lantus is given. Will continue to monitor pt

## 2018-03-06 NOTE — Progress Notes (Addendum)
PROGRESS NOTE    Sydney Morrow  TJQ:[id number]  DOB: Feb 13, 1961  DOA: 03/05/2018 PCP: Gildardo Pounds, NP   Brief Admission Hx: Sydney Morrow is a 57 y.o. female with medical history significant of HTN, DM2, hyperlipidemia, who was admitted with uncontrolled hyperglycemia and placed on IV insulin infusion.    MDM/Assessment & Plan:   1. Uncontrolled type 2 Diabetes mellitus - A1c is pending.  Pt was admitted to SDU and placed on IV insulin infusion.  Unfortunately was transitioned overnight with only 10 units of lantus given, now blood sugars are rising again.  I ordered additional dose of lantus this morning and sliding scale coverage.   2. Oral thrush - continue oral nystatin medication.  3. Dyslipidemia - continue atorvastatin.  4. Hypertension - currently on amlodipine. BP controlled.   5. AKI - improved with hydration.  6. Hypokalemia - IV KCL ordered.  7. Bradycardia - improved, currently holding metoprolol/clonidine.  8. GERD - GI planning EGD when medically stabilized.   DVT prophylaxis: SCDs Code Status: Full  Family Communication: patient  Disposition Plan: Home    Consultants:  GI   Subjective: Pt without complaints.    Objective: Vitals:   03/06/18 0300 03/06/18 0405 03/06/18 0500 03/06/18 0600  BP: 135/67  (!) 155/75 (!) 154/68  Pulse: 72  71 62  Resp: 11  17 14   Temp:  97.9 F (36.6 C)    TempSrc:  Oral    SpO2: 96%  99% 97%  Weight:      Height:        Intake/Output Summary (Last 24 hours) at 03/06/2018 0631 Last data filed at 03/06/2018 0404 Gross per 24 hour  Intake 6131.47 ml  Output 800 ml  Net 5331.47 ml   Filed Weights   03/05/18 1141 03/05/18 1621  Weight: 84.8 kg (187 lb) 74.8 kg (164 lb 14.5 oz)   REVIEW OF SYSTEMS  As per history otherwise all reviewed and reported negative  Exam:  General exam: awake, alert, NAD, cooperative.  Respiratory system: No increased work of breathing. Cardiovascular system: S1 & S2 heard.   No JVD, murmurs, gallops, clicks or pedal edema. Gastrointestinal system: Abdomen is nondistended, soft and nontender. Normal bowel sounds heard. Central nervous system: Alert and oriented. No focal neurological deficits. Extremities: no clubbing or cyanosis.  Data Reviewed: Basic Metabolic Panel: Recent Labs  Lab 03/05/18 1203 03/06/18 0409  NA 129* 135  K 4.2 3.3*  CL 90* 101  CO2 23 26  GLUCOSE 710* 167*  BUN 37* 21*  CREATININE 1.52* 1.09*  CALCIUM 8.8* 8.6*   Liver Function Tests: No results for input(s): AST, ALT, ALKPHOS, BILITOT, PROT, ALBUMIN in the last 168 hours. No results for input(s): LIPASE, AMYLASE in the last 168 hours. No results for input(s): AMMONIA in the last 168 hours. CBC: Recent Labs  Lab 03/05/18 1723 03/06/18 0409  WBC 4.6 6.4  HGB 14.5 14.1  HCT 42.9 42.0  MCV 92.3 91.9  PLT 188 192   Cardiac Enzymes: No results for input(s): CKTOTAL, CKMB, CKMBINDEX, TROPONINI in the last 168 hours. CBG (last 3)  Recent Labs    03/06/18 0155 03/06/18 0251 03/06/18 0354  GLUCAP 129* 167* 154*   Recent Results (from the past 240 hour(s))  MRSA PCR Screening     Status: None   Collection Time: 03/05/18  4:56 PM  Result Value Ref Range Status   MRSA by PCR NEGATIVE NEGATIVE Final    Comment:  The GeneXpert MRSA Assay (FDA approved for NASAL specimens only), is one component of a comprehensive MRSA colonization surveillance program. It is not intended to diagnose MRSA infection nor to guide or monitor treatment for MRSA infections. Performed at [facility], [address] [place], [place] [address]      Studies: No results found.   Scheduled Meds: . amLODipine  10 mg Oral Daily  . atorvastatin  20 mg Oral Daily  . famotidine  10 mg Oral Daily  . feeding supplement (GLUCERNA SHAKE)  237 mL Oral TID BM  . insulin aspart  0-5 Units Subcutaneous QHS  . insulin aspart  0-9 Units Subcutaneous TID WC  . insulin glargine  10 Units  Subcutaneous Daily  . nystatin  5 mL Oral QID  . pantoprazole  40 mg Oral QAC breakfast  . polyvinyl alcohol  1 drop Both Eyes Daily   Continuous Infusions: . sodium chloride 100 mL/hr at 03/06/18 2563    Active Problems:   Peptic ulcer   Mixed hyperlipidemia   Uncontrolled hypertension   Acute hyperglycemia   Hyperglycemia   Bradycardia   Hyponatremia   DM (diabetes mellitus), type 2, uncontrolled (HCC)   GERD (gastroesophageal reflux disease)   Oral thrush   Critical Care Time spent: 88 mins  Irwin Brakeman, MD, FAAFP [facility] Pager [phone]  If 7PM-7AM, please contact night-coverage [email or web address] Password [facility] 03/06/2018, 6:31 AM    LOS: 1 day

## 2018-03-06 NOTE — Progress Notes (Signed)
Pt discharged home. Instructions given including medication changes and follow-up appointments. Understanding verbalized. PIV removed; no complications. Pt taken out via [place] by NT with all belongings.

## 2018-03-06 NOTE — Discharge Summary (Signed)
Physician Discharge Summary  Sydney Morrow:[id number] DOB: [birth year] DOA: 03/05/2018  PCP: Sydney Pounds, NP GI: [facility]  Admit date: 03/05/2018 Discharge date: 03/06/2018  Admitted From: Home  Disposition: Home Recommendations for Outpatient Follow-up:  1. Follow up with PCP in 1 weeks 2. Please consider starting patient on insulin for uncontrolled diabetes mellitus (A1c >15%) 3. Follow up with GI in 4 months 4. CBC/BMP in 1 week recommended.  Discharge Condition: STABLE   CODE STATUS: FULL    Brief Hospitalization Summary: Please see all hospital notes, images, labs for full details of the hospitalization. HPI: Sydney Morrow is a 57 y.o. female with medical history significant of HTN, DM2, hyperlipidemia, who had presented to [facility] for elective endoscopy and colonoscopy. Patient was undergoing endoscopy for surveillance of PUD and she was undergoing colonoscopy for colon cancer screening. On pre-procedure labs, she was noted to be severely hyperglycemic with blood sugar greater than 700. Patient reports that her blood sugars are normally well controlled and normally range around 120s. For the last 24-48 hours, in preparation for her procedures, she has been drinking liquids. Unfortunately, these liquids were not sugar free. She has been more thirsty lately, but denies any nausea, vomiting, dysuria or polyuria. No fever, cough, shortness of breath or chest pain. Since blood sugars were significantly elevated, she was referred for admission for stabilization prior to undergoing procedures  The patient was admitted to the stepdown ICU for an IV insulin infusion.  The patient achieved glycemic control on IV insulin.  The patient was kept n.p.o. for a scheduled endoscopy procedure today.  The patient was transitioned to Lantus and taken off the IV insulin early this morning.  Her blood sugars did trend back up into the 200s.  Her hemoglobin A1c came back at 15.4%.  The  patient does not have medical insurance at this time.  The patient is being cared for at the [facility].  I did not start her on insulin at this time because she will need intensive education and assistance with insulin and diabetic supplies, including needles, etc.  I think this is more appropriate for this to be managed outpatient with her primary care provider.  It is her responsibility to follow up with her primary care clinic to have her diabetes further evaluated and treated in more depth.  I have counseled her to please stop smoking cigarettes and discussed the dangers associated with diabetes mellitus and tobacco use.  I have provided her with extensive written patient information to take home regarding the dangers of tobacco, diabetes mellitus, management and dangers associated with poorly controlled diabetes mellitus.  This patient is HIGH RISK for acute and chronic complications of poorly controlled diabetes mellitus.  I have increased her metformin to 1000 mg BID until she can follow up with her PCP for further management.  She will need to be referred to a diabetes educator through her PCP clinic.    The patient had a colonoscopy and EGD on 03/06/2018. Impression:               - Redundant colon.                           - The distal rectum and anal verge are normal on  retroflexion view.                           - INCOMPLETE colonoscopy due to poor prep in RIGHT                            COLON. ADEQUATE EXAM FROM RECUIM TO SPLENIC FLUXURE. Moderate Sedation:      Moderate (conscious) sedation was administered by the endoscopy nurse       and supervised by the endoscopist. The following parameters were       monitored: oxygen saturation, heart rate, blood pressure, and response       to care. Total physician intraservice time was 49 minutes. Recommendation:           - Return patient to hospital ward.                           - High  fiber diet and diabetic (ADA) diet.                           - Continue present medications.                           - Repeat colonoscopy in 5 years because the bowel                            preparation was poor.                           - Return to GI office in 4 months.  EGD 03/06/2018 Impression:               - LA Grade A reflux esophagitis.                           - Medium-sized hiatal hernia.                           - MODERATE Gastritis/Duodenitis-NO PUD Moderate Sedation:      Moderate (conscious) sedation was administered by the endoscopy nurse       and supervised by the endoscopist. The following parameters were       monitored: oxygen saturation, heart rate, blood pressure, and response       to care. Total physician intraservice time was 49 minutes. Recommendation:           - Return patient to hospital ward.                           - High fiber diet and diabetic (ADA) diet.                           - Continue present medications.                           - Await pathology results.                           - Use Protonix (pantoprazole)   40 mg PO BID.                           - Return to my office in 4 months.  The patient was strongly advised to follow the recommendations as recommended by GI and to follow-up with GI for pathology results.  The patient should follow-up with GI in 4 months per recommendations.  The patient's Protonix has been increased to 40 mg twice daily.  The patient will need a repeat colonoscopy in 5 years because of poor bowel preparation.  Patient was given instructions to return if symptoms recur, worsen or new problems develop.  The patient verbalized understanding.  Discharge Diagnoses:  Active Problems:   Peptic ulcer   Mixed hyperlipidemia   Uncontrolled hypertension   Acute hyperglycemia   Hyperglycemia   Bradycardia   Hyponatremia   DM (diabetes mellitus), type 2, uncontrolled (HCC)   GERD (gastroesophageal reflux disease)    Oral thrush   Personal history of colonic polyps   History of peptic ulcer disease  Discharge Instructions: Discharge Instructions    Call MD for:  difficulty breathing, headache or visual disturbances   Complete by:  As directed    Call MD for:  extreme fatigue   Complete by:  As directed    Call MD for:  persistant dizziness or light-headedness   Complete by:  As directed    Call MD for:  persistant nausea and vomiting   Complete by:  As directed    Call MD for:  severe uncontrolled pain   Complete by:  As directed    Increase activity slowly   Complete by:  As directed      Allergies as of 03/06/2018      Reactions   Aspirin Other (See Comments)   Mouth, upper lip, nose area becomes numb   Morphine And Related Other (See Comments)   "Did not feel good"   Penicillins Other (See Comments)   Unknown Has patient had a PCN reaction causing immediate rash, facial/tongue/throat swelling, SOB or lightheadedness with hypotension: Unknown Has patient had a PCN reaction causing severe rash involving mucus membranes or skin necrosis: Unknown Has patient had a PCN reaction that required hospitalization: No Has patient had a PCN reaction occurring within the last 10 years: No If all of the above answers are "NO", then may proceed with Cephalosporin use.      Medication List    TAKE these medications   acetaminophen 500 MG tablet Commonly known as:  TYLENOL Take 1,000 mg by mouth every 6 (six) hours as needed for moderate pain or headache.   amLODipine 10 MG tablet Commonly known as:  NORVASC Take 1 tablet (10 mg total) by mouth daily.   atorvastatin 20 MG tablet Commonly known as:  LIPITOR Take 1 tablet (20 mg total) by mouth daily.   cloNIDine 0.1 MG tablet Commonly known as:  CATAPRES Take 1 tablet (0.1 mg total) by mouth 2 (two) times daily.   CO Q 10 PO Take 1 capsule by mouth daily.   docusate sodium 100 MG capsule Commonly known as:  COLACE Take 100 mg by mouth  daily as needed for mild constipation.   famotidine 10 MG tablet Commonly known as:  PEPCID Take 10 mg by mouth daily.   Fish Oil 1000 MG Caps Take 1,000 mg by mouth 2 (two) times daily.   fluticasone 50 MCG/ACT nasal spray Commonly known as:  FLONASE Place   1 spray into both nostrils daily as needed for allergies or rhinitis. What changed:  when to take this   glucose blood test strip Commonly known as:  TRUE METRIX BLOOD GLUCOSE TEST Use as instructed   hydroxypropyl methylcellulose / hypromellose 2.5 % ophthalmic solution Commonly known as:  ISOPTO TEARS / GONIOVISC Place 1 drop into both eyes daily.   ibuprofen 200 MG tablet Commonly known as:  ADVIL,MOTRIN Take 400-600 mg by mouth every 6 (six) hours as needed for headache or moderate pain.   loratadine 10 MG tablet Commonly known as:  CLARITIN Take 10 mg by mouth daily.   losartan-hydrochlorothiazide 100-25 MG tablet Commonly known as:  HYZAAR Take 1 tablet by mouth daily.   lovastatin 20 MG tablet Commonly known as:  MEVACOR Take 20 mg by mouth daily.   metFORMIN 500 MG tablet Commonly known as:  GLUCOPHAGE Take 2 tablets (1,000 mg total) by mouth 2 (two) times daily with a meal. What changed:  how much to take   metoprolol tartrate 50 MG tablet Commonly known as:  LOPRESSOR Take 1 tablet (50 mg total) by mouth 2 (two) times daily.   MULTIVITAMIN PO Take 1 tablet by mouth daily.   pantoprazole 40 MG tablet Commonly known as:  PROTONIX Take 1 tablet (40 mg total) by mouth 2 (two) times daily. 30 minutes before breakfast What changed:  when to take this   phenazopyridine 95 MG tablet Commonly known as:  PYRIDIUM Take 95 mg by mouth 3 (three) times daily as needed for pain.   polyethylene glycol packet Commonly known as:  MIRALAX / GLYCOLAX Take 17 g by mouth daily as needed.   TRUE METRIX METER Devi 1 kit by Does not apply route 4 (four) times daily.   TRUEPLUS LANCETS 28G Misc 28 g by Does not  apply route 4 (four) times daily.      Follow-up Information    Sydney Pounds, NP. Schedule an appointment as soon as possible for a visit in 1 week(s).   Specialty:  Nurse Practitioner Why:  Hospital Follow Up, uncontrolled diabetes  Contact information: [place] [address] [phone]        Danie Binder, MD. Schedule an appointment as soon as possible for a visit in 4 month(s).   Specialty:  Gastroenterology Contact information: [place] [address] [phone]          Allergies  Allergen Reactions  . Aspirin Other (See Comments)    Mouth, upper lip, nose area becomes numb  . Morphine And Related Other (See Comments)    "Did not feel good"  . Penicillins Other (See Comments)    Unknown Has patient had a PCN reaction causing immediate rash, facial/tongue/throat swelling, SOB or lightheadedness with hypotension: Unknown Has patient had a PCN reaction causing severe rash involving mucus membranes or skin necrosis: Unknown Has patient had a PCN reaction that required hospitalization: No Has patient had a PCN reaction occurring within the last 10 years: No If all of the above answers are "NO", then may proceed with Cephalosporin use.    Allergies as of 03/06/2018      Reactions   Aspirin Other (See Comments)   Mouth, upper lip, nose area becomes numb   Morphine And Related Other (See Comments)   "Did not feel good"   Penicillins Other (See Comments)   Unknown Has patient had a PCN reaction causing immediate rash, facial/tongue/throat swelling, SOB or lightheadedness with hypotension:  Unknown Has patient had a PCN reaction causing severe rash involving mucus membranes or skin necrosis: Unknown Has patient had a PCN reaction that required hospitalization: No Has patient had a PCN reaction occurring within the last 10 years: No If all of the above answers are "NO", then may proceed with Cephalosporin use.      Medication  List    TAKE these medications   acetaminophen 500 MG tablet Commonly known as:  TYLENOL Take 1,000 mg by mouth every 6 (six) hours as needed for moderate pain or headache.   amLODipine 10 MG tablet Commonly known as:  NORVASC Take 1 tablet (10 mg total) by mouth daily.   atorvastatin 20 MG tablet Commonly known as:  LIPITOR Take 1 tablet (20 mg total) by mouth daily.   cloNIDine 0.1 MG tablet Commonly known as:  CATAPRES Take 1 tablet (0.1 mg total) by mouth 2 (two) times daily.   CO Q 10 PO Take 1 capsule by mouth daily.   docusate sodium 100 MG capsule Commonly known as:  COLACE Take 100 mg by mouth daily as needed for mild constipation.   famotidine 10 MG tablet Commonly known as:  PEPCID Take 10 mg by mouth daily.   Fish Oil 1000 MG Caps Take 1,000 mg by mouth 2 (two) times daily.   fluticasone 50 MCG/ACT nasal spray Commonly known as:  FLONASE Place 1 spray into both nostrils daily as needed for allergies or rhinitis. What changed:  when to take this   glucose blood test strip Commonly known as:  TRUE METRIX BLOOD GLUCOSE TEST Use as instructed   hydroxypropyl methylcellulose / hypromellose 2.5 % ophthalmic solution Commonly known as:  ISOPTO TEARS / GONIOVISC Place 1 drop into both eyes daily.   ibuprofen 200 MG tablet Commonly known as:  ADVIL,MOTRIN Take 400-600 mg by mouth every 6 (six) hours as needed for headache or moderate pain.   loratadine 10 MG tablet Commonly known as:  CLARITIN Take 10 mg by mouth daily.   losartan-hydrochlorothiazide 100-25 MG tablet Commonly known as:  HYZAAR Take 1 tablet by mouth daily.   lovastatin 20 MG tablet Commonly known as:  MEVACOR Take 20 mg by mouth daily.   metFORMIN 500 MG tablet Commonly known as:  GLUCOPHAGE Take 2 tablets (1,000 mg total) by mouth 2 (two) times daily with a meal. What changed:  how much to take   metoprolol tartrate 50 MG tablet Commonly known as:  LOPRESSOR Take 1 tablet (50  mg total) by mouth 2 (two) times daily.   MULTIVITAMIN PO Take 1 tablet by mouth daily.   pantoprazole 40 MG tablet Commonly known as:  PROTONIX Take 1 tablet (40 mg total) by mouth 2 (two) times daily. 30 minutes before breakfast What changed:  when to take this   phenazopyridine 95 MG tablet Commonly known as:  PYRIDIUM Take 95 mg by mouth 3 (three) times daily as needed for pain.   polyethylene glycol packet Commonly known as:  MIRALAX / GLYCOLAX Take 17 g by mouth daily as needed.   TRUE METRIX METER Devi 1 kit by Does not apply route 4 (four) times daily.   TRUEPLUS LANCETS 28G Misc 28 g by Does not apply route 4 (four) times daily.       Procedures/Studies:  No results found.   Subjective:   Discharge Exam: Vitals:   03/06/18 1625 03/06/18 1635  BP: (!) 159/94   Pulse: 80 83  Resp: 19 15  Temp:    97.7 F (36.5 C)  SpO2: 100% 98%   Vitals:   03/06/18 1610 03/06/18 1620 03/06/18 1625 03/06/18 1635  BP:  130/77 (!) 159/94   Pulse: 88 80 80 83  Resp: _0 Temp:    97.7 F (36.5 C)  TempSrc:    Oral  SpO2: 98% 100% 100% 98%  Weight:      Height:        General: Pt is alert, awake, not in acute distress Cardiovascular: RRR, S1/S2 +, no rubs, no gallops Respiratory: CTA bilaterally, no wheezing, no rhonchi Abdominal: Soft, NT, ND, bowel sounds + Extremities: no edema, no cyanosis   The results of significant diagnostics from this hospitalization (including imaging, microbiology, ancillary and laboratory) are listed below for reference.     Microbiology: Recent Results (from the past 240 hour(s))  Culture, Urine     Status: Abnormal   Collection Time: 03/05/18 12:30 PM  Result Value Ref Range Status   Specimen Description   Final    URINE, CLEAN CATCH Performed at [facility], [address] [place], [place] [address]    Special Requests   Final    Normal Performed at [facility], [address], [place], [place] [address]     Culture MULTIPLE SPECIES PRESENT, SUGGEST RECOLLECTION (A)  Final   Report Status 03/06/2018 FINAL  Final  MRSA PCR Screening     Status: None   Collection Time: 03/05/18  4:56 PM  Result Value Ref Range Status   MRSA by PCR NEGATIVE NEGATIVE Final    Comment:        The GeneXpert MRSA Assay (FDA approved for NASAL specimens only), is one component of a comprehensive MRSA colonization surveillance program. It is not intended to diagnose MRSA infection nor to guide or monitor treatment for MRSA infections. Performed at [facility], [address], [place], [place] [address]      Labs: BNP (last 3 results) No results for input(s): BNP in the last 8760 hours. Basic Metabolic Panel: Recent Labs  Lab 03/05/18 1203 03/06/18 0409 03/06/18 0423  NA 129* 135  --   K 4.2 3.3*  --   CL 90* 101  --   CO2 23 26  --   GLUCOSE 710* 167*  --   BUN 37* 21*  --   CREATININE 1.52* 1.09*  --   CALCIUM 8.8* 8.6*  --   MG  --   --  2.1   Liver Function Tests: No results for input(s): AST, ALT, ALKPHOS, BILITOT, PROT, ALBUMIN in the last 168 hours. No results for input(s): LIPASE, AMYLASE in the last 168 hours. No results for input(s): AMMONIA in the last 168 hours. CBC: Recent Labs  Lab 03/05/18 1723 03/06/18 0409  WBC 4.6 6.4  HGB 14.5 14.1  HCT 42.9 42.0  MCV 92.3 91.9  PLT 188 192   Cardiac Enzymes: No results for input(s): CKTOTAL, CKMB, CKMBINDEX, TROPONINI in the last 168 hours. BNP: Invalid input(s): POCBNP CBG: Recent Labs  Lab 03/06/18 0638 03/06/18 0732 03/06/18 1137 03/06/18 1322 03/06/18 1634  GLUCAP 241* 249* 226* 231* 200*   D-Dimer No results for input(s): DDIMER in the last 72 hours. Hgb A1c Recent Labs    03/06/18 0737  HGBA1C 15.4*   Lipid Profile No results for input(s): CHOL, HDL, LDLCALC, TRIG, CHOLHDL, LDLDIRECT in the last 72 hours. Thyroid function studies No results for input(s): TSH, T4TOTAL, T3FREE, THYROIDAB in the last 72  hours.  Invalid  input(s): FREET3 Anemia work up No results for input(s): VITAMINB12, FOLATE, FERRITIN, TIBC, IRON, RETICCTPCT in the last 72 hours. Urinalysis    Component Value Date/Time   COLORURINE STRAW (A) 03/05/2018 1230   APPEARANCEUR CLEAR 03/05/2018 1230   LABSPEC 1.018 03/05/2018 1230   PHURINE 6.0 03/05/2018 1230   GLUCOSEU >=500 (A) 03/05/2018 1230   HGBUR NEGATIVE 03/05/2018 1230   BILIRUBINUR NEGATIVE 03/05/2018 1230   BILIRUBINUR neg 04/09/2017 1021   KETONESUR 20 (A) 03/05/2018 1230   PROTEINUR 30 (A) 03/05/2018 1230   UROBILINOGEN 0.2 04/09/2017 1021   NITRITE NEGATIVE 03/05/2018 1230   LEUKOCYTESUR NEGATIVE 03/05/2018 1230   Sepsis Labs Invalid input(s): PROCALCITONIN,  WBC,  LACTICIDVEN Microbiology Recent Results (from the past 240 hour(s))  Culture, Urine     Status: Abnormal   Collection Time: 03/05/18 12:30 PM  Result Value Ref Range Status   Specimen Description   Final    URINE, CLEAN CATCH Performed at [facility], [address] [place], [place] [address]    Special Requests   Final    Normal Performed at [facility], [address] [place], [place] [address]    Culture MULTIPLE SPECIES PRESENT, SUGGEST RECOLLECTION (A)  Final   Report Status 03/06/2018 FINAL  Final  MRSA PCR Screening     Status: None   Collection Time: 03/05/18  4:56 PM  Result Value Ref Range Status   MRSA by PCR NEGATIVE NEGATIVE Final    Comment:        The GeneXpert MRSA Assay (FDA approved for NASAL specimens only), is one component of a comprehensive MRSA colonization surveillance program. It is not intended to diagnose MRSA infection nor to guide or monitor treatment for MRSA infections. Performed at [facility], [address] [place], [place] [address]    Time coordinating discharge:   SIGNED:  Clanford Johnson, MD  [facility] 03/06/2018, 4:41 PM Pager [phone]  If 7PM-7AM, please contact night-coverage [email or web address] Password  [email or web address]  

## 2018-03-06 NOTE — Progress Notes (Signed)
Lantus 10 units given @ 3014- will discontinue Insulin gtt in 2 hours. Pt educated on POC

## 2018-03-06 NOTE — Op Note (Signed)
[facility] Patient Name: Sydney Morrow Procedure Date: 03/06/2018 2:36 PM MRN: [id number] Date of Birth: 1961/07/17 Attending MD: Barney Drain MD, MD CSN: [id number] Age: 57 Admit Type: Inpatient Procedure:                Colonoscopy, SCREENING Indications:              Screening for colorectal malignant neoplasm Providers:                Barney Drain MD, MD, Gwynneth Albright RN, RN,                            Randa Spike, Technician Referring MD:             Gildardo Pounds Medicines:                Meperidine 75 mg IV, Midazolam 5 mg IV Complications:            No immediate complications. Estimated Blood Loss:     Estimated blood loss: none. Procedure:                Pre-Anesthesia Assessment:                           - Prior to the procedure, a History and Physical                            was performed, and patient medications and                            allergies were reviewed. The patient's tolerance of                            previous anesthesia was also reviewed. The risks                            and benefits of the procedure and the sedation                            options and risks were discussed with the patient.                            All questions were answered, and informed consent                            was obtained. Prior Anticoagulants: The patient has                            taken no previous anticoagulant or antiplatelet                            agents. ASA Grade Assessment: II - A patient with                            mild systemic disease. After reviewing the risks  and benefits, the patient was deemed in                            satisfactory condition to undergo the procedure.                            After obtaining informed consent, the colonoscope                            was passed under direct vision. Throughout the                            procedure, the patient's blood pressure,  pulse, and                            oxygen saturations were monitored continuously. The                            [id number] ([id number]) scope was introduced through                            the anus and advanced to the the cecum, identified                            by appendiceal orifice and ileocecal valve. The                            colonoscopy was technically difficult and complex                            due to poor endoscopic visualization and a                            redundant colon. Successful completion of the                            procedure was aided by increasing the dose of                            sedation medication, changing the patient to a                            supine position, straightening and shortening the                            scope to obtain bowel loop reduction, lavage and                            COLOWRAP. The patient tolerated the procedure                            fairly well. The quality of the bowel preparation  was good except the cecum was poor. The ileocecal                            valve, appendiceal orifice, and rectum were                            photographed. Scope In: 3:18:01 PM Scope Out: 3:41:25 PM Scope Withdrawal Time: 0 hours 12 minutes 1 second  Total Procedure Duration: 0 hours 23 minutes 24 seconds  Findings:      The recto-sigmoid colon, sigmoid colon and descending colon were       significantly redundant.      The retroflexed view of the distal rectum and anal verge was normal and       showed no anal or rectal abnormalities. Impression:               - Redundant colon.                           - The distal rectum and anal verge are normal on                            retroflexion view.                           - INCOMPLETE colonoscopy due to poor prep in RIGHT                            COLON. ADEQUATE EXAM FROM RECUIM TO SPLENIC FLUXURE. Moderate Sedation:       Moderate (conscious) sedation was administered by the endoscopy nurse       and supervised by the endoscopist. The following parameters were       monitored: oxygen saturation, heart rate, blood pressure, and response       to care. Total physician intraservice time was 49 minutes. Recommendation:           - Return patient to hospital ward.                           - High fiber diet and diabetic (ADA) diet.                           - Continue present medications.                           - Repeat colonoscopy in 5 years because the bowel                            preparation was poor.                           - Return to GI office in 4 months. Procedure Code(s):        --- Professional ---                           [phone], Colonoscopy, flexible; diagnostic, including  collection of specimen(s) by brushing or washing,                            when performed (separate procedure)                           G0500, Moderate sedation services provided by the                            same physician or other qualified health care                            professional performing a gastrointestinal                            endoscopic service that sedation supports,                            requiring the presence of an independent trained                            observer to assist in the monitoring of the                            patient's level of consciousness and physiological                            status; initial 15 minutes of intra-service time;                            patient age 13 years or older (additional time may                            be reported with [phone], as appropriate)                           [phone], Moderate sedation services provided by the                            same physician or other qualified health care                            professional performing the diagnostic or                            therapeutic service  that the sedation supports,                            requiring the presence of an independent trained                            observer to assist in the monitoring of the  patient's level of consciousness and physiological                            status; each additional 15 minutes intraservice                            time (List separately in addition to code for                            primary service)                           [phone], Moderate sedation services provided by the                            same physician or other qualified health care                            professional performing the diagnostic or                            therapeutic service that the sedation supports,                            requiring the presence of an independent trained                            observer to assist in the monitoring of the                            patient's level of consciousness and physiological                            status; each additional 15 minutes intraservice                            time (List separately in addition to code for                            primary service) Diagnosis Code(s):        --- Professional ---                           Z12.11, Encounter for screening for malignant                            neoplasm of colon                           Q43.8, Other specified congenital malformations of                            intestine CPT copyright 2017 [employer]. All rights reserved. The codes documented in this report are preliminary and upon coder review may  be revised to meet current compliance requirements. Barney Drain,  MD Barney Drain MD, MD 03/06/2018 4:09:25 PM This report has been signed electronically. Number of Addenda: 0

## 2018-03-07 ENCOUNTER — Ambulatory Visit: Payer: Self-pay | Attending: Nurse Practitioner | Admitting: Nurse Practitioner

## 2018-03-07 ENCOUNTER — Ambulatory Visit: Payer: Self-pay

## 2018-03-07 ENCOUNTER — Encounter: Payer: Self-pay | Admitting: Nurse Practitioner

## 2018-03-07 ENCOUNTER — Telehealth: Payer: Self-pay | Admitting: Gastroenterology

## 2018-03-07 VITALS — BP 128/75 | HR 71 | Temp 98.2°F | Ht 67.0 in | Wt 171.0 lb

## 2018-03-07 DIAGNOSIS — Z886 Allergy status to analgesic agent status: Secondary | ICD-10-CM | POA: Insufficient documentation

## 2018-03-07 DIAGNOSIS — K449 Diaphragmatic hernia without obstruction or gangrene: Secondary | ICD-10-CM | POA: Insufficient documentation

## 2018-03-07 DIAGNOSIS — I10 Essential (primary) hypertension: Secondary | ICD-10-CM | POA: Insufficient documentation

## 2018-03-07 DIAGNOSIS — E1165 Type 2 diabetes mellitus with hyperglycemia: Secondary | ICD-10-CM | POA: Insufficient documentation

## 2018-03-07 DIAGNOSIS — Z79899 Other long term (current) drug therapy: Secondary | ICD-10-CM | POA: Insufficient documentation

## 2018-03-07 DIAGNOSIS — K219 Gastro-esophageal reflux disease without esophagitis: Secondary | ICD-10-CM | POA: Insufficient documentation

## 2018-03-07 DIAGNOSIS — Z885 Allergy status to narcotic agent status: Secondary | ICD-10-CM | POA: Insufficient documentation

## 2018-03-07 DIAGNOSIS — Z880 Allergy status to penicillin: Secondary | ICD-10-CM | POA: Insufficient documentation

## 2018-03-07 DIAGNOSIS — Z7984 Long term (current) use of oral hypoglycemic drugs: Secondary | ICD-10-CM | POA: Insufficient documentation

## 2018-03-07 DIAGNOSIS — E119 Type 2 diabetes mellitus without complications: Secondary | ICD-10-CM

## 2018-03-07 DIAGNOSIS — E782 Mixed hyperlipidemia: Secondary | ICD-10-CM | POA: Insufficient documentation

## 2018-03-07 LAB — HEMOGLOBIN A1C: Hgb A1c MFr Bld: >15.5 % — ABNORMAL HIGH (ref 4.8–5.6)

## 2018-03-07 LAB — GLUCOSE, POCT (MANUAL RESULT ENTRY): POC GLUCOSE: 331 mg/dL — AB (ref 70–99)

## 2018-03-07 MED ORDER — OMEPRAZOLE 20 MG PO CPDR: DELAYED_RELEASE_CAPSULE | ORAL | 11 refills | Status: DC

## 2018-03-07 MED ORDER — LOSARTAN POTASSIUM-HCTZ 100-25 MG PO TABS: 1.0000 | ORAL_TABLET | Freq: Every day | ORAL | 3 refills | Status: DC

## 2018-03-07 MED ORDER — OMEPRAZOLE 20 MG PO CPDR: DELAYED_RELEASE_CAPSULE | ORAL | 3 refills | Status: DC

## 2018-03-07 MED ORDER — TRUEPLUS LANCETS 28G MISC: 28.0000 g | Freq: Four times a day (QID) | 5 refills | Status: DC

## 2018-03-07 MED ORDER — GLIMEPIRIDE 2 MG PO TABS: 2.0000 mg | ORAL_TABLET | Freq: Every day | ORAL | 3 refills | Status: DC

## 2018-03-07 MED ORDER — SITAGLIPTIN PHOS-METFORMIN HCL 50-1000 MG PO TABS: 1.0000 | ORAL_TABLET | Freq: Two times a day (BID) | ORAL | 1 refills | Status: DC

## 2018-03-07 MED ORDER — AMLODIPINE BESYLATE 10 MG PO TABS: 10.0000 mg | ORAL_TABLET | Freq: Every day | ORAL | 5 refills | Status: DC

## 2018-03-07 MED ORDER — GLUCOSE BLOOD VI STRP: ORAL_STRIP | 12 refills | Status: DC

## 2018-03-07 MED ORDER — CLONIDINE HCL 0.1 MG PO TABS: 0.1000 mg | ORAL_TABLET | Freq: Two times a day (BID) | ORAL | 2 refills | Status: DC

## 2018-03-07 NOTE — Addendum Note (Signed)
Addended by: Zara Council C on: 03/07/2018 11:00 AM   Modules accepted: Orders

## 2018-03-07 NOTE — Patient Instructions (Signed)
Diabetes blood sugar goals  Fasting in AM before breakfast which means at least 8 hrs of no eating or drinking) except water or unsweetened coffee or tea): 90-130 2 hrs after meals: < 180,   Hypoglycemia or low blood sugar: < 70 (You should not have hypoglycemia.)  Aim for 30 minutes of exercise most days. Rethink what you drink. Water is great! Aim for 2-3 Carb Choices per meal (30-45 grams) +/- 1 either way  Aim for 0-15 Carbs per snack if hungry  Include protein in moderation with your meals and snacks  Consider reading food labels for Total Carbohydrate and Fat Grams of foods  Consider checking BG at alternate times per day  Continue taking medication as directed Be mindful about how much sugar you are adding to beverages and other foods. Fruit Punch - find one with no sugar  Measure and decrease portions of carbohydrate foods  Make your plate and don't go back for seconds

## 2018-03-07 NOTE — Progress Notes (Signed)
 Assessment & Plan:  [name] was seen today for follow-up.  Diagnoses and all orders for this visit:  Type 2 diabetes mellitus without complication, without long-term current use of insulin (HCC) -     Glucose (CBG) -     sitaGLIPtin-metformin (JANUMET) 50-1000 MG tablet; Take 1 tablet by mouth 2 (two) times daily with a meal. -     glimepiride (AMARYL) 2 MG tablet; Take 1 tablet (2 mg total) by mouth daily before breakfast. -     TRUEPLUS LANCETS 28G MISC; 28 g by Does not apply route 4 (four) times daily. -     glucose blood (TRUE METRIX BLOOD GLUCOSE TEST) test strip; Use as instructed Continue blood sugar control as discussed in office today, low carbohydrate diet, and regular physical exercise as tolerated, 150 minutes per week (30 min each day, 5 days per week, or 50 min 3 days per week). Keep blood sugar logs with fasting goal of 90-130 mg/dl, post prandial (after you eat) less than 180.  For Hypoglycemia: BS <60 and Hyperglycemia BS >400; contact the clinic ASAP. Annual eye exams and foot exams are recommended.  Essential hypertension -     losartan-hydrochlorothiazide (HYZAAR) 100-25 MG tablet; Take 1 tablet by mouth daily. -     amLODipine (NORVASC) 10 MG tablet; Take 1 tablet (10 mg total) by mouth daily. -     cloNIDine (CATAPRES) 0.1 MG tablet; Take 1 tablet (0.1 mg total) by mouth 2 (two) times daily. Continue all antihypertensives as prescribed.  Remember to bring in your blood pressure log with you for your follow up appointment.  DASH/Mediterranean Diets are healthier choices for HTN.   Mixed hyperlipidemia Continue atorvastatin as prescribed.  INSTRUCTIONS: Work on a low fat, heart healthy diet and participate in regular aerobic exercise program by working out at least 150 minutes per week; 5 days a week-30 minutes per day. Avoid red meat, fried foods. junk foods, sodas, sugary drinks, unhealthy snacking, alcohol and smoking.  Drink at least 48oz of water per day and  monitor your carbohydrate intake daily.    Patient has been counseled on age-appropriate routine health concerns for screening and prevention. These are reviewed and up-to-date. Referrals have been placed accordingly. Immunizations are up-to-date or declined.    Subjective:   Chief Complaint  Patient presents with  . Follow-up    Patient is here to follow-up on diabetes.    HPI Sydney Morrow 57 y.o. female presents to office today for follow up to DM, HTN, HPL.  Hypertension She is not exercising and is adherent to low salt diet.  She does not have a blood pressure log  today.  Blood pressure is well controlled today.  Cardiac symptoms none. Patient denies chest pain, chest pressure/discomfort, claudication, dyspnea, exertional chest pressure/discomfort, irregular heart beat, near-syncope, palpitations and paroxysmal nocturnal dyspnea.  Cardiovascular risk factors: diabetes mellitus, dyslipidemia and hypertension. Use of agents associated with hypertension: none.  History of target organ damage: none. BP Readings from Last 3 Encounters:  03/07/18 128/75  03/06/18 (!) 159/94  01/16/18 (!) 169/93    Hyperlipidemia Patient presents for follow up to hyperlipidemia.  She is medication compliant. She is not diet compliant and denies skin xanthelasma or statin intolerance including myalgias.  Lab Results  Component Value Date   CHOL 304 (H) 04/09/2017   Lab Results  Component Value Date   HDL 51 04/09/2017   Lab Results  Component Value Date   LDLCALC 189 (H) 04/09/2017     Lab Results  Component Value Date   TRIG 319 (H) 04/09/2017   Lab Results  Component Value Date   CHOLHDL 6.0 (H) 04/09/2017      Diabetes Mellitus Type II Current symptoms/problems include hyperglycemia and have been worsening.  Known diabetic complications: cardiovascular disease Current diabetic medications include: Metformin 1082m BID Eye exam current (within one year): no Weight trend:  decreasing steadily Prior visit with dietician: no Current monitoring regimen: office lab tests - 4 times yearly Home blood sugar records: fasting range: 180s and higher Any episodes of hypoglycemia? no Is She on ACE inhibitor or angiotensin II receptor blocker?  Yes  Lab Results  Component Value Date   HGBA1C 15.4 (H) 03/06/2018   HGBA1C >15.5 (H) 03/05/2018   HGBA1C 8.4 12/03/2017  A1c has doubled today. She recently had a colonoscopy and states she was told she could drink any type of clear liquids along with her prep so she had been drinking gingerale, sweat tea and other sugary drinks. I explained to her that with her diabetes she should not have consumed sugary bevarages. She also reports at the hospital she was given potato wedges, barbecue sandwich and sweet tea after her colonoscopy. I am unsure if this sudden increase is due to recent unhealty dietary consumption pre and post colonscopy or if she has been noncompliant with her diet prior to this. Her A1c has never been this high in the past. WIll switch to janumet and amaryl and have her return in a few weeks with her meter for readings. If she appears to be hypoglycemic based on readings will stop amaryl. She was instructed to check her blood glucose at home BID. She verbalized agreement.    Review of Systems  Constitutional: Negative for fever, malaise/fatigue and weight loss.  HENT: Negative.  Negative for nosebleeds.   Eyes: Negative.  Negative for blurred vision, double vision and photophobia.  Respiratory: Negative.  Negative for cough and shortness of breath.   Cardiovascular: Negative.  Negative for chest pain, palpitations and leg swelling.  Gastrointestinal: Negative.  Negative for heartburn, nausea and vomiting.  Musculoskeletal: Negative.  Negative for myalgias.  Neurological: Negative.  Negative for dizziness, focal weakness, seizures and headaches.  Psychiatric/Behavioral: Negative.  Negative for suicidal ideas.     Past Medical History:  Diagnosis Date  . Acid reflux   . [place]   . History of anemia   . History of hiatal hernia   . Hypercholesterolemia   . Hypertension   . Jaw inflammation, right 01/30/2017    Past Surgical History:  Procedure Laterality Date  . DENTAL SURGERY    . ESOPHAGOGASTRODUODENOSCOPY  2011   white, raised, somewhat fixed plaques esophageal mucosa concerning for possible Candida esophagitis s/p brushing. Multiple gastric antral ulcerations, multiple small bulbar erosions and ulcerations.   . TOOTH EXTRACTION N/A 02/01/2017   Procedure: FULL DENTAL EXTRACTIONS;  Surgeon: JDiona Browner DDS;  [facility]  Service: Oral Surgery;  Laterality: N/A;  . TUBAL LIGATION      Family History  Problem Relation Age of Onset  . Breast cancer Mother   . Colon cancer Neg Hx   . Colon polyps Neg Hx     Social History Reviewed with no changes to be made today.   Outpatient Medications Prior to Visit  Medication Sig Dispense Refill  . acetaminophen (TYLENOL) 500 MG tablet Take 1,000 mg by mouth every 6 (six) hours as needed for moderate pain or headache.    . Blood  Glucose Monitoring Suppl (TRUE METRIX METER) DEVI 1 kit by Does not apply route 4 (four) times daily. 1 Device 0  . Coenzyme Q10 (CO Q 10 PO) Take 1 capsule by mouth daily.    . famotidine (PEPCID) 10 MG tablet Take 10 mg by mouth daily.    . hydroxypropyl methylcellulose / hypromellose (ISOPTO TEARS / GONIOVISC) 2.5 % ophthalmic solution Place 1 drop into both eyes daily.    . ibuprofen (ADVIL,MOTRIN) 200 MG tablet Take 400-600 mg by mouth every 6 (six) hours as needed for headache or moderate pain.    . loratadine (CLARITIN) 10 MG tablet Take 10 mg by mouth daily.    . lovastatin (MEVACOR) 20 MG tablet Take 20 mg by mouth daily.  0  . metoprolol tartrate (LOPRESSOR) 50 MG tablet Take 1 tablet (50 mg total) by mouth 2 (two) times daily. 60 tablet 3  . Multiple Vitamins-Minerals (MULTIVITAMIN PO) Take 1  tablet by mouth daily.    . Omega-3 Fatty Acids (FISH OIL) 1000 MG CAPS Take 1,000 mg by mouth 2 (two) times daily.     . polyethylene glycol (MIRALAX / GLYCOLAX) packet Take 17 g by mouth daily as needed. 14 each 0  . amLODipine (NORVASC) 10 MG tablet Take 1 tablet (10 mg total) by mouth daily. 30 tablet 5  . cloNIDine (CATAPRES) 0.1 MG tablet Take 1 tablet (0.1 mg total) by mouth 2 (two) times daily. 60 tablet 2  . glucose blood (TRUE METRIX BLOOD GLUCOSE TEST) test strip Use as instructed 100 each 12  . losartan-hydrochlorothiazide (HYZAAR) 100-25 MG tablet Take 1 tablet by mouth daily. 90 tablet 3  . metFORMIN (GLUCOPHAGE) 500 MG tablet Take 2 tablets (1,000 mg total) by mouth 2 (two) times daily with a meal. 180 tablet 3  . TRUEPLUS LANCETS 28G MISC 28 g by Does not apply route 4 (four) times daily. 120 each 5  . atorvastatin (LIPITOR) 20 MG tablet Take 1 tablet (20 mg total) by mouth daily. (Patient not taking: Reported on 03/07/2018) 90 tablet 3  . docusate sodium (COLACE) 100 MG capsule Take 100 mg by mouth daily as needed for mild constipation.    . fluticasone (FLONASE) 50 MCG/ACT nasal spray Place 1 spray into both nostrils daily as needed for allergies or rhinitis. (Patient taking differently: Place 1 spray into both nostrils 2 (two) times daily. ) 16 g 6  . phenazopyridine (PYRIDIUM) 95 MG tablet Take 95 mg by mouth 3 (three) times daily as needed for pain.    . omeprazole (PRILOSEC) 20 MG capsule 1 PO 30 MINS PRIOR TO BREAKFAST AND SUPPER. (Patient not taking: Reported on 03/07/2018) 60 capsule 11  . pantoprazole (PROTONIX) 40 MG tablet Take 1 tablet (40 mg total) by mouth 2 (two) times daily. 30 minutes before breakfast (Patient not taking: Reported on 03/07/2018) 90 tablet 3   No facility-administered medications prior to visit.     Allergies  Allergen Reactions  . Aspirin Other (See Comments)    Mouth, upper lip, nose area becomes numb  . Morphine And Related Other (See Comments)     "Did not feel good"  . Penicillins Other (See Comments)    Unknown Has patient had a PCN reaction causing immediate rash, facial/tongue/throat swelling, SOB or lightheadedness with hypotension: Unknown Has patient had a PCN reaction causing severe rash involving mucus membranes or skin necrosis: Unknown Has patient had a PCN reaction that required hospitalization: No Has patient had a PCN reaction occurring within   the last 10 years: No If all of the above answers are "NO", then may proceed with Cephalosporin use.        Objective:    BP 128/75 (BP Location: Left Arm, Patient Position: Sitting, Cuff Size: Normal)   Pulse 71   Temp 98.2 F (36.8 C) (Oral)   Ht 5' 7" (1.702 m)   Wt 171 lb (77.6 kg)   LMP 08/13/2014 (Approximate)   SpO2 98%   BMI 26.78 kg/m  Wt Readings from Last 3 Encounters:  03/07/18 171 lb (77.6 kg)  03/06/18 164 lb (74.4 kg)  01/15/18 187 lb 3.2 oz (84.9 kg)    Physical Exam  Constitutional: She is oriented to person, place, and time. She appears well-developed and well-nourished. She is cooperative.  HENT:  Head: Normocephalic and atraumatic.  Eyes: EOM are normal.  Neck: Normal range of motion.  Cardiovascular: Normal rate, regular rhythm, normal heart sounds and intact distal pulses. Exam reveals no gallop and no friction rub.  No murmur heard. Pulmonary/Chest: Effort normal and breath sounds normal. No tachypnea. No respiratory distress. She has no decreased breath sounds. She has no wheezes. She has no rhonchi. She has no rales. She exhibits no tenderness.  Abdominal: Soft. Bowel sounds are normal.  Musculoskeletal: Normal range of motion. She exhibits no edema.  Neurological: She is alert and oriented to person, place, and time. Coordination normal.  Skin: Skin is warm and dry.  Psychiatric: She has a normal mood and affect. Her behavior is normal. Judgment and thought content normal.  Nursing note and vitals reviewed.      Patient has been  counseled extensively about nutrition and exercise as well as the importance of adherence with medications and regular follow-up. The patient was given clear instructions to go to ER or return to medical center if symptoms don't improve, worsen or new problems develop. The patient verbalized understanding.   Follow-up: Return in about 3 weeks (around 03/28/2018) for please bring your meter with your for your office visiit.   Zelda W Fleming, FNP-BC  [facility] [place], [place] [phone]   03/07/2018, 11:48 AM 

## 2018-03-07 NOTE — Telephone Encounter (Signed)
PT NEEDS TO SEE ENDOCRINOLOGY, Dx: DIABETES(HGA1C 15.2).

## 2018-03-07 NOTE — Telephone Encounter (Signed)
Referral sent to endocrinology via Epic. 

## 2018-03-10 ENCOUNTER — Telehealth: Payer: Self-pay | Admitting: Gastroenterology

## 2018-03-10 ENCOUNTER — Encounter (HOSPITAL_COMMUNITY): Payer: Self-pay | Admitting: Gastroenterology

## 2018-03-10 NOTE — Telephone Encounter (Signed)
PATIENT SCHEDULED AND ON RECALL  °

## 2018-03-10 NOTE — Telephone Encounter (Signed)
PLEASE CALL PT. HER stomach Bx shows mild gastritis. CONTINUE PROTONIX. TAKE 30 MINUTES PRIOR TO MEALS TWICE DAILY FOR ONE MONTH THEN ONCE DAILY.   FOLLOW UP IN 4 MOS DX: GASTRITIS.   NEXT COLONOSCOPY IN 5 YEARS.

## 2018-03-10 NOTE — Telephone Encounter (Signed)
Pt is aware.  

## 2018-03-20 ENCOUNTER — Telehealth: Payer: Self-pay | Admitting: Nurse Practitioner

## 2018-03-20 NOTE — Telephone Encounter (Signed)
I called the Sydney Morrow to informed her that the death line was 04-02-18 and we received the fax on 03/19/18, also the letter she sent on the fax was NOT notarize, the bank statement was incomplete, and to please call the FO to schedule a new financial appt

## 2018-03-26 ENCOUNTER — Telehealth: Payer: Self-pay | Admitting: Nurse Practitioner

## 2018-03-26 NOTE — Telephone Encounter (Signed)
Pt was called Sydney Morrow to informed her that she is potential medicaid and she need to contact Mrs Yehuda Savannah at [phone] and find out the statu of the application with medicaid, for now need to cancel the appt with financial until she call Mrs Florence Canner first.

## 2018-03-28 ENCOUNTER — Telehealth: Payer: Self-pay | Admitting: Nurse Practitioner

## 2018-03-28 ENCOUNTER — Ambulatory Visit: Payer: Self-pay | Attending: Family Medicine | Admitting: Pharmacist

## 2018-03-28 ENCOUNTER — Ambulatory Visit: Payer: Self-pay

## 2018-03-28 ENCOUNTER — Encounter: Payer: Self-pay | Admitting: Pharmacist

## 2018-03-28 DIAGNOSIS — E119 Type 2 diabetes mellitus without complications: Secondary | ICD-10-CM | POA: Insufficient documentation

## 2018-03-28 DIAGNOSIS — Z7984 Long term (current) use of oral hypoglycemic drugs: Secondary | ICD-10-CM | POA: Insufficient documentation

## 2018-03-28 LAB — GLUCOSE, POCT (MANUAL RESULT ENTRY): POC GLUCOSE: 107 mg/dL — AB (ref 70–99)

## 2018-03-28 MED ORDER — METFORMIN HCL 500 MG PO TABS: 1000.0000 mg | ORAL_TABLET | Freq: Two times a day (BID) | ORAL | 2 refills | Status: DC

## 2018-03-28 MED ORDER — TRUE METRIX METER W/DEVICE KIT: PACK | 0 refills | Status: DC

## 2018-03-28 MED ORDER — GLUCOSE BLOOD VI STRP: ORAL_STRIP | 12 refills | Status: DC

## 2018-03-28 MED ORDER — TRUEPLUS LANCETS 28G MISC: 11 refills | Status: DC

## 2018-03-28 MED FILL — TRUE METRIX TEST STRIP: 25 days supply | Qty: 100 | Fill #0

## 2018-03-28 MED FILL — metFORMIN HCL 500 MG TABS: 500 | 30 days supply | Qty: 120 | Fill #0

## 2018-03-28 MED FILL — TRUEplus LANCETS 28G MISC: 25 days supply | Qty: 100 | Fill #0

## 2018-03-28 MED FILL — !TRUE METRIX BLOOD GLUCOSE: 1 days supply | Qty: 1 | Fill #0

## 2018-03-28 NOTE — Patient Instructions (Addendum)
Thank you for coming to see me today. Please do the following:  1. Continue metformin and glimepiride.  2. I have sent in True Metrix supplies to check blood sugars at home.  3. Continue checking blood sugars at home. It's really important that you record these and bring these in to your next doctor's appointment. If you get in readings above 500 or lower than 70, call me or the clinic to let your doctor know. See below on how to treat low blood sugar.  4. Continue making the lifestyle changes we've discussed together during our visit. Diet and exercise play a significant role in improving your blood sugars.  5. Follow-up with PCP in 1 month.   Hypoglycemia or low blood sugar:   Low blood sugar can happen quickly and may become an emergency if not treated right away.   While this shouldn't happen often, it can be brought upon if you skip a meal or do not eat enough. Also, if your insulin or other diabetes medications are dosed too high, this can cause your blood sugar to go to low.   Warning signs of low blood sugar include: 1. Feeling shaky or dizzy 2. Feeling weak or tired  3. Excessive hunger 4. Feeling anxious or upset  5. Sweating even when you aren't exercising  What to do if I experience low blood sugar? 1. Check your blood sugar with your meter. If lower than 70, proceed to step 2.  2. Treat with 3-4 glucose tablets or 3 packets of regular sugar. If these aren't around, you can try hard candy. Yet another option would be to drink 4 ounces of fruit juice or 6 ounces of REGULAR soda.  3. Re-check your sugar in 15 minutes. If it is still below 70, do what you did in step 2 again. If has come back up, go ahead and eat a snack or small meal at this time.

## 2018-03-28 NOTE — Progress Notes (Signed)
    S:  PCP: [name]    No chief complaint on file.  Patient arrives in good spirits. Presents for glucometer review and education at the request of Zelda. Patient was referred on and last seen by Zelda on 03/07/18 .     Patient reports adherence with medications but is taking differently.  - Reports taking metformin 500 mg 2 tablets BID - Glimepiride 2 mg daily before breakfast - Pt is not taking Janumet 50-1000 mg BID as prescribed  Patient denies hypoglycemic events.  O:  POCT glucose: 107 14 day avg on home meter: 148  Range: 126 - 182  Lab Results  Component Value Date   HGBA1C 15.4 (H) 03/06/2018   There were no vitals filed for this visit.  Lipid Panel     Component Value Date/Time   CHOL 304 (H) 04/09/2017 1128   TRIG 319 (H) 04/09/2017 1128   HDL 51 04/09/2017 1128   CHOLHDL 6.0 (H) 04/09/2017 1128   LDLCALC 189 (H) 04/09/2017 1128   A/P: Patient was educated on the use of the True Metrix blood glucose meter. Reviewed necessary supplies and operation of the meter. Also reviewed goal blood glucose levels. Patient was able to demonstrate use. All questions and concerns were addressed.  Of note, her sugar levels are close to goal. She reports 120s-130s fasting with higher 160s-180s during the day after eating. These levels are reflective of metformin and glimepiride therapy. She is not on Janumet. Recommend to follow-up with PCP in 1 month to re-assess. A1c of 15.4 not reflective of current sugar levels. Anticipate updated A1c 05/2018.   Written patient instructions provided.  Total time in face to face counseling 15 minutes.   Follow up PCP in 1 month.      Patient seen with: Marylene Buerger, PharmD Candidate [employer] Class of 2021  Benard Halsted, PharmD, [place] [phone]

## 2018-04-07 ENCOUNTER — Other Ambulatory Visit: Payer: Self-pay | Admitting: Physician Assistant

## 2018-04-29 ENCOUNTER — Ambulatory Visit: Payer: Self-pay | Admitting: "Endocrinology

## 2018-05-07 ENCOUNTER — Telehealth: Payer: Self-pay | Admitting: Nurse Practitioner

## 2018-05-07 NOTE — Telephone Encounter (Signed)
Patient would like refills of lovastatin 20mg , patient states she received this medication [facility] And would like more. Patient uses [facility] in [place], please follow up.

## 2018-05-07 NOTE — Telephone Encounter (Signed)
CMA spoke to patient. Patient stated she is only taking lovastatin 20 mg for cholesterol and a provider at [facility] previously prescribed it for her and would like PCP to refill it for her.

## 2018-05-11 ENCOUNTER — Other Ambulatory Visit: Payer: Self-pay | Admitting: Nurse Practitioner

## 2018-05-11 MED ORDER — LOVASTATIN 20 MG PO TABS: 20.0000 mg | ORAL_TABLET | Freq: Every day | ORAL | 3 refills | Status: DC

## 2018-05-11 NOTE — Telephone Encounter (Signed)
Script has been sent to [facility]

## 2018-05-12 NOTE — Telephone Encounter (Signed)
CMA attempt to reach patient to inform her Rx was sent. No answer and left a VM for patient.

## 2018-05-21 ENCOUNTER — Ambulatory Visit: Payer: Self-pay | Admitting: Gastroenterology

## 2018-05-27 ENCOUNTER — Ambulatory Visit (INDEPENDENT_AMBULATORY_CARE_PROVIDER_SITE_OTHER): Payer: Self-pay | Admitting: "Endocrinology

## 2018-05-27 ENCOUNTER — Encounter: Payer: Self-pay | Admitting: "Endocrinology

## 2018-05-27 VITALS — BP 154/84 | HR 76 | Ht 67.0 in | Wt 180.0 lb

## 2018-05-27 DIAGNOSIS — E1122 Type 2 diabetes mellitus with diabetic chronic kidney disease: Secondary | ICD-10-CM

## 2018-05-27 DIAGNOSIS — E782 Mixed hyperlipidemia: Secondary | ICD-10-CM

## 2018-05-27 DIAGNOSIS — N183 Chronic kidney disease, stage 3 unspecified: Secondary | ICD-10-CM

## 2018-05-27 DIAGNOSIS — I10 Essential (primary) hypertension: Secondary | ICD-10-CM | POA: Insufficient documentation

## 2018-05-27 DIAGNOSIS — N1831 Chronic kidney disease, stage 3a: Secondary | ICD-10-CM | POA: Insufficient documentation

## 2018-05-27 DIAGNOSIS — E119 Type 2 diabetes mellitus without complications: Secondary | ICD-10-CM | POA: Insufficient documentation

## 2018-05-27 MED ORDER — METFORMIN HCL 500 MG PO TABS: 500.0000 mg | ORAL_TABLET | Freq: Two times a day (BID) | ORAL | 2 refills | Status: DC

## 2018-05-27 NOTE — Progress Notes (Signed)
Endocrinology Consult Note       05/27/2018, 4:26 PM   Subjective:    Patient ID: Sydney Morrow, female    DOB: [birth year].  Sydney Morrow is being seen in consultation for management of currently uncontrolled symptomatic diabetes requested by  Gildardo Pounds, NP.   Past Medical History:  Diagnosis Date  . Acid reflux   . Diabetes ([place])   . History of anemia   . History of hiatal hernia   . Hypercholesterolemia   . Hypertension   . Jaw inflammation, right 01/30/2017   Past Surgical History:  Procedure Laterality Date  . COLONOSCOPY N/A 03/05/2018   Procedure: COLONOSCOPY;  Surgeon: Danie Binder, MD;  [facility];  Service: Endoscopy;  Laterality: N/A;  12:00pm  . COLONOSCOPY N/A 03/06/2018   Procedure: COLONOSCOPY;  Surgeon: Danie Binder, MD;  [facility];  Service: Endoscopy;  Laterality: N/A;  . DENTAL SURGERY    . ESOPHAGOGASTRODUODENOSCOPY  2011   white, raised, somewhat fixed plaques esophageal mucosa concerning for possible Candida esophagitis s/p brushing. Multiple gastric antral ulcerations, multiple small bulbar erosions and ulcerations.   . ESOPHAGOGASTRODUODENOSCOPY N/A 03/05/2018   Procedure: ESOPHAGOGASTRODUODENOSCOPY (EGD);  Surgeon: Danie Binder, MD;  [facility];  Service: Endoscopy;  Laterality: N/A;  . ESOPHAGOGASTRODUODENOSCOPY N/A 03/06/2018   Procedure: ESOPHAGOGASTRODUODENOSCOPY (EGD);  Surgeon: Danie Binder, MD;  [facility];  Service: Endoscopy;  Laterality: N/A;  . TOOTH EXTRACTION N/A 02/01/2017   Procedure: FULL DENTAL EXTRACTIONS;  Surgeon: Diona Browner, DDS;  Location: [place];  Service: Oral Surgery;  Laterality: N/A;  . TUBAL LIGATION     Social History   Socioeconomic History  . Marital status: Single    Spouse name: Not on file  . Number of children: Not on file  . Years of education: Not on file  .  Highest education level: Not on file  Occupational History  . Not on file  Social Needs  . Financial resource strain: Not on file  . Food insecurity:    Worry: Not on file    Inability: Not on file  . Transportation needs:    Medical: Not on file    Non-medical: Not on file  Tobacco Use  . Smoking status: Current Every Day Smoker    Packs/day: 0.50    Years: 15.00    Pack years: 7.50    Types: Cigarettes  . Smokeless tobacco: Never Used  Substance and Sexual Activity  . Alcohol use: No  . Drug use: No  . Sexual activity: Not Currently  Lifestyle  . Physical activity:    Days per week: Not on file    Minutes per session: Not on file  . Stress: Not on file  Relationships  . Social connections:    Talks on phone: Not on file    Gets together: Not on file    Attends religious service: Not on file    Active member of club or organization: Not on file    Attends meetings of clubs or organizations: Not on file    Relationship status: Not  on file  Other Topics Concern  . Not on file  Social History Narrative  . Not on file   Outpatient Encounter Medications as of 05/27/2018  Medication Sig  . acetaminophen (TYLENOL) 500 MG tablet Take 1,000 mg by mouth every 6 (six) hours as needed for moderate pain or headache.  Marland Kitchen amLODipine (NORVASC) 10 MG tablet Take 1 tablet (10 mg total) by mouth daily.  . Blood Glucose Monitoring Suppl (TRUE METRIX METER) w/Device KIT Use as directed to check blood sugar at home.  . cloNIDine (CATAPRES) 0.1 MG tablet Take 1 tablet (0.1 mg total) by mouth 2 (two) times daily.  . Coenzyme Q10 (CO Q 10 PO) Take 1 capsule by mouth daily.  Marland Kitchen docusate sodium (COLACE) 100 MG capsule Take 100 mg by mouth daily as needed for mild constipation.  . famotidine (PEPCID) 10 MG tablet Take 10 mg by mouth daily.  . fluticasone (FLONASE) 50 MCG/ACT nasal spray Place 1 spray into both nostrils daily as needed for allergies or rhinitis. (Patient taking differently: Place  1 spray into both nostrils 2 (two) times daily. )  . glimepiride (AMARYL) 2 MG tablet Take 1 tablet (2 mg total) by mouth daily before breakfast.  . glucose blood (TRUE METRIX BLOOD GLUCOSE TEST) test strip Use as instructed  . hydroxypropyl methylcellulose / hypromellose (ISOPTO TEARS / GONIOVISC) 2.5 % ophthalmic solution Place 1 drop into both eyes daily.  Marland Kitchen ibuprofen (ADVIL,MOTRIN) 200 MG tablet Take 400-600 mg by mouth every 6 (six) hours as needed for headache or moderate pain.  Marland Kitchen loratadine (CLARITIN) 10 MG tablet Take 10 mg by mouth daily.  Marland Kitchen losartan-hydrochlorothiazide (HYZAAR) 100-25 MG tablet Take 1 tablet by mouth daily.  Marland Kitchen lovastatin (MEVACOR) 20 MG tablet Take 1 tablet (20 mg total) by mouth at bedtime.  . metFORMIN (GLUCOPHAGE) 500 MG tablet Take 1 tablet (500 mg total) by mouth 2 (two) times daily with a meal.  . metoprolol tartrate (LOPRESSOR) 50 MG tablet TAKE 1 TABLET BY MOUTH TWICE DAILY  . Multiple Vitamins-Minerals (MULTIVITAMIN PO) Take 1 tablet by mouth daily.  . Omega-3 Fatty Acids (FISH OIL) 1000 MG CAPS Take 1,000 mg by mouth 2 (two) times daily.   . phenazopyridine (PYRIDIUM) 95 MG tablet Take 95 mg by mouth 3 (three) times daily as needed for pain.  . polyethylene glycol (MIRALAX / GLYCOLAX) packet Take 17 g by mouth daily as needed.  . TRUEPLUS LANCETS 28G MISC Use as directed.  . [DISCONTINUED] metFORMIN (GLUCOPHAGE) 500 MG tablet Take 2 tablets (1,000 mg total) by mouth 2 (two) times daily with a meal.   No facility-administered encounter medications on file as of 05/27/2018.     ALLERGIES: Allergies  Allergen Reactions  . Aspirin Other (See Comments)    Mouth, upper lip, nose area becomes numb  . Morphine And Related Other (See Comments)    "Did not feel good"  . Penicillins Other (See Comments)    Unknown Has patient had a PCN reaction causing immediate rash, facial/tongue/throat swelling, SOB or lightheadedness with hypotension: Unknown Has patient  had a PCN reaction causing severe rash involving mucus membranes or skin necrosis: Unknown Has patient had a PCN reaction that required hospitalization: No Has patient had a PCN reaction occurring within the last 10 years: No If all of the above answers are "NO", then may proceed with Cephalosporin use.     VACCINATION STATUS:  There is no immunization history on file for this patient.  Diabetes  She  presents for her initial diabetic visit. She has type 2 diabetes mellitus. Onset time: She reports that she was diagnosed at approximate age of 48 years. Her disease course has been worsening. There are no hypoglycemic associated symptoms. Pertinent negatives for hypoglycemia include no confusion, headaches, pallor or seizures. Associated symptoms include blurred vision, fatigue, polydipsia and polyuria. Pertinent negatives for diabetes include no chest pain and no polyphagia. There are no hypoglycemic complications. Symptoms are improving. Diabetic complications include nephropathy. Risk factors for coronary artery disease include diabetes mellitus, dyslipidemia, family history, hypertension, sedentary lifestyle and tobacco exposure. Current diabetic treatment includes oral agent (dual therapy) (She is currently on metformin 1000 mg p.o. twice daily, glimepiride 2 mg p.o. daily.). Her weight is fluctuating minimally. She is following a generally unhealthy diet. When asked about meal planning, she reported none. She has not had a previous visit with a dietitian. She rarely participates in exercise. (She did not bring any meter nor logs to review today.  She reports that her blood glucose readings are between 90-120.  Her A1c was 15.4% on March 06, 2018.) An ACE inhibitor/angiotensin II receptor blocker is being taken.  Hyperlipidemia  This is a chronic problem. The current episode started more than 1 year ago. Exacerbating diseases include diabetes. Pertinent negatives include no chest pain, myalgias or  shortness of breath. Current antihyperlipidemic treatment includes statins. Risk factors for coronary artery disease include family history, dyslipidemia, diabetes mellitus, hypertension, a sedentary lifestyle and post-menopausal.  Hypertension  This is a chronic problem. The current episode started more than 1 year ago. The problem is uncontrolled. Associated symptoms include blurred vision. Pertinent negatives include no chest pain, headaches, palpitations or shortness of breath. Past treatments include angiotensin blockers and central alpha agonists.      Review of Systems  Constitutional: Positive for fatigue. Negative for chills, fever and unexpected weight change.  HENT: Negative for trouble swallowing and voice change.   Eyes: Positive for blurred vision. Negative for visual disturbance.  Respiratory: Negative for cough, shortness of breath and wheezing.   Cardiovascular: Negative for chest pain, palpitations and leg swelling.  Gastrointestinal: Negative for diarrhea, nausea and vomiting.  Endocrine: Positive for polydipsia and polyuria. Negative for cold intolerance, heat intolerance and polyphagia.  Musculoskeletal: Negative for arthralgias and myalgias.  Skin: Negative for color change, pallor, rash and wound.  Neurological: Negative for seizures and headaches.  Psychiatric/Behavioral: Negative for confusion and suicidal ideas.    Objective:    BP (!) 154/84   Pulse 76   Ht _0  (1.702 m)   Wt 180 lb (81.6 kg)   LMP 08/13/2014 (Approximate)   BMI 28.19 kg/m   Wt Readings from Last 3 Encounters:  05/27/18 180 lb (81.6 kg)  03/07/18 171 lb (77.6 kg)  03/06/18 164 lb (74.4 kg)     Physical Exam  Constitutional: She is oriented to person, place, and time. She appears well-developed.  HENT:  Head: Normocephalic and atraumatic.  Eyes: EOM are normal.  Neck: Normal range of motion. Neck supple. No tracheal deviation present. No thyromegaly present.  Cardiovascular:  Normal rate and regular rhythm.  Pulmonary/Chest: Effort normal and breath sounds normal.  Abdominal: Soft. Bowel sounds are normal. There is no tenderness. There is no guarding.  Musculoskeletal: Normal range of motion. She exhibits no edema.  Neurological: She is alert and oriented to person, place, and time. She has normal reflexes. No cranial nerve deficit. Coordination normal.  Skin: Skin is warm and dry. No rash noted.  No erythema. No pallor.  Psychiatric: She has a normal mood and affect. Judgment normal.      CMP ( most recent) CMP     Component Value Date/Time   NA 135 03/06/2018 0409   NA 140 04/09/2017 1128   K 3.3 (L) 03/06/2018 0409   CL 101 03/06/2018 0409   CO2 26 03/06/2018 0409   GLUCOSE 167 (H) 03/06/2018 0409   BUN 21 (H) 03/06/2018 0409   BUN 14 04/09/2017 1128   CREATININE 1.09 (H) 03/06/2018 0409   CALCIUM 8.6 (L) 03/06/2018 0409   PROT 7.3 04/09/2017 1128   ALBUMIN 4.4 04/09/2017 1128   AST 14 04/09/2017 1128   ALT 19 04/09/2017 1128   ALKPHOS 87 04/09/2017 1128   BILITOT 0.2 04/09/2017 1128   GFRNONAA 55 (L) 03/06/2018 0409   GFRAA >60 03/06/2018 0409     Diabetic Labs (most recent): Lab Results  Component Value Date   HGBA1C 15.4 (H) 03/06/2018   HGBA1C >15.5 (H) 03/05/2018   HGBA1C 8.4 12/03/2017     Lipid Panel ( most recent) Lipid Panel     Component Value Date/Time   CHOL 304 (H) 04/09/2017 1128   TRIG 319 (H) 04/09/2017 1128   HDL 51 04/09/2017 1128   CHOLHDL 6.0 (H) 04/09/2017 1128   LDLCALC 189 (H) 04/09/2017 1128       Assessment & Plan:   1. Type 2 diabetes mellitus with stage 3 chronic kidney disease, without long-term current use of insulin (HCC)  - Sydney Morrow has currently uncontrolled symptomatic type 2 DM since 57 years of age,  with most recent A1c of 15.4 %. Recent labs reviewed.  -her diabetes is complicated by stage 3 renal insufficiency, sedentary life and she remains at extremely high risk for more acute  and chronic complications which include CAD, CVA, CKD, retinopathy, and neuropathy. These are all discussed in detail with her.  - I have counseled her on diet management and weight loss, by adopting a carbohydrate restricted/protein rich diet.  - Suggestion is made for her to avoid simple carbohydrates  from her diet including Cakes, Sweet Desserts, Ice Cream, Soda (diet and regular), Sweet Tea, Candies, Chips, Cookies, Store Bought Juices, Alcohol in Excess of  1-2 drinks a day, Artificial Sweeteners,  Coffee Creamer, and "Sugar-free" Products. This will help patient to have more stable blood glucose profile and potentially avoid unintended weight gain.  - I encouraged her to switch to  unprocessed or minimally processed complex starch and increased protein intake (animal or plant source), fruits, and vegetables.  - she is advised to stick to a routine mealtimes to eat 3 meals  a day and avoid unnecessary snacks ( to snack only to correct hypoglycemia).   - she will be scheduled with Jearld Fenton, RDN, CDE for individualized diabetes education.  - I have approached her with the following individualized plan to manage diabetes and patient agrees:   -Based on her presenting glycemic burden with A1c of 15.4%, she will likely need insulin treatment in order for her to achieve and maintain control of diabetes to target.  -However, patient is very reluctant to go on insulin treatment.  -She agrees to initiate monitoring of blood glucose 4 times a day-before meals and at bedtime and return in 10 days with her meter and logs for reevaluation.   -She will be sent for new set of labs including A1c and CMP.   - she is encouraged to call clinic for blood glucose  levels less than 70 or above 300 mg /dl. -Given her CKD, I advised her to lower her metformin to 500 mg p.o. twice daily after breakfast and supper.   -She will be kept on glimepiride 2 mg p.o. daily with breakfast for now.   - she will be  considered for incretin therapy as appropriate next visit. - Patient specific target  A1c;  LDL, HDL, Triglycerides, and  Waist Circumference were discussed in detail.  2) BP/HTN:  her blood pressure is not controlled to target.   she is advised to continue her current medications including Hyzaar 100/25 mg p.o. daily, metoprolol 50 mg p.o. twice daily, and amlodipine 10 mg p.o. daily.    3) Lipids/HPL:   Review of her recent lipid panel showed LDL uncontrolled  at 189.  she  is advised to continue lovastatin 20 mg p.o. nightly.  She will have fasting lipid panel on her subsequent visits.  Side effects and precautions discussed with her.  4)  Weight/Diet:  Body mass index is 28.19 kg/m.   I discussed with her the fact that loss of 5 - 10% of her  current body weight will have the most impact on her diabetes management.  CDE Consult will be initiated . Exercise, and detailed carbohydrates information provided  -  detailed on discharge instructions.  5) Chronic Care/Health Maintenance:  -she  is on ACEI/ARB and Statin medications and  is encouraged to initiate and continue to follow up with Ophthalmology, Dentist,  Podiatrist at least yearly or according to recommendations, and advised to quit smoking. I have recommended yearly flu vaccine and pneumonia vaccine at least every 5 years; moderate intensity exercise for up to 150 minutes weekly; and  sleep for at least 7 hours a day.  - I advised patient to maintain close follow up with Gildardo Pounds, NP for primary care needs.  - Time spent with the patient: 45 minutes, of which >50% was spent in obtaining information about her symptoms, reviewing her previous labs, evaluations, and treatments, counseling her about her currently uncontrolled, complicated type 2 diabetes; hypertension, and hyperlipidemia, and developing developing  plans for long term treatment based on the latest recommendations.  Sydney Morrow participated in the discussions,  expressed understanding, and voiced agreement with the above plans.  All questions were answered to her satisfaction. she is encouraged to contact clinic should she have any questions or concerns prior to her return visit.  Follow up plan: - Return in about 10 days (around 06/06/2018) for Follow up with Pre-visit Labs, Meter, and Logs, Labs Today- Non-Fasting Ok.  Glade Lloyd, MD [facility] [address] [place], [place] [address] Phone: [phone]  Fax: [phone]    05/27/2018, 4:26 PM  This note was partially dictated with voice recognition software. Similar sounding words can be transcribed inadequately or may not  be corrected upon review.

## 2018-05-27 NOTE — Patient Instructions (Signed)

## 2018-06-10 ENCOUNTER — Ambulatory Visit: Payer: Self-pay | Admitting: "Endocrinology

## 2018-06-20 ENCOUNTER — Other Ambulatory Visit: Payer: Self-pay

## 2018-06-27 ENCOUNTER — Other Ambulatory Visit: Payer: Self-pay

## 2018-06-27 NOTE — Telephone Encounter (Signed)
Pharmacy was requesting refill on Losartan 100mg , they were informed via fax that the patient is currently on the combo Losartan/HCTZ 100-25mg  that was sent to them on 03/07/18

## 2018-07-01 ENCOUNTER — Encounter: Payer: Self-pay | Admitting: "Endocrinology

## 2018-07-01 ENCOUNTER — Ambulatory Visit (INDEPENDENT_AMBULATORY_CARE_PROVIDER_SITE_OTHER): Payer: Self-pay | Admitting: "Endocrinology

## 2018-07-01 VITALS — BP 145/74 | HR 65 | Ht 67.0 in | Wt 175.0 lb

## 2018-07-01 DIAGNOSIS — E782 Mixed hyperlipidemia: Secondary | ICD-10-CM

## 2018-07-01 DIAGNOSIS — Z91199 Patient's noncompliance with other medical treatment and regimen due to unspecified reason: Secondary | ICD-10-CM | POA: Insufficient documentation

## 2018-07-01 DIAGNOSIS — N183 Chronic kidney disease, stage 3 (moderate): Secondary | ICD-10-CM

## 2018-07-01 DIAGNOSIS — E1122 Type 2 diabetes mellitus with diabetic chronic kidney disease: Secondary | ICD-10-CM

## 2018-07-01 DIAGNOSIS — I10 Essential (primary) hypertension: Secondary | ICD-10-CM

## 2018-07-01 DIAGNOSIS — Z9119 Patient's noncompliance with other medical treatment and regimen: Secondary | ICD-10-CM

## 2018-07-01 NOTE — Patient Instructions (Signed)

## 2018-07-01 NOTE — Progress Notes (Signed)
Endocrinology follow-up  Note       07/01/2018, 4:41 PM   Subjective:    Patient ID: Sydney Morrow, female    DOB: [birth year].  Sydney Morrow is being seen in follow-up for management of currently uncontrolled symptomatic type 2 diabetes, hyperlipidemia, hypertension. PCP: Gildardo Pounds, NP    Past Medical History:  Diagnosis Date  . Acid reflux   . Diabetes ([place])   . History of anemia   . History of hiatal hernia   . Hypercholesterolemia   . Hypertension   . Jaw inflammation, right 01/30/2017   Past Surgical History:  Procedure Laterality Date  . COLONOSCOPY N/A 03/05/2018   Procedure: COLONOSCOPY;  Surgeon: Danie Binder, MD;  [facility];  Service: Endoscopy;  Laterality: N/A;  12:00pm  . COLONOSCOPY N/A 03/06/2018   Procedure: COLONOSCOPY;  Surgeon: Danie Binder, MD;  [facility];  Service: Endoscopy;  Laterality: N/A;  . DENTAL SURGERY    . ESOPHAGOGASTRODUODENOSCOPY  2011   white, raised, somewhat fixed plaques esophageal mucosa concerning for possible Candida esophagitis s/p brushing. Multiple gastric antral ulcerations, multiple small bulbar erosions and ulcerations.   . ESOPHAGOGASTRODUODENOSCOPY N/A 03/05/2018   Procedure: ESOPHAGOGASTRODUODENOSCOPY (EGD);  Surgeon: Danie Binder, MD;  [facility];  Service: Endoscopy;  Laterality: N/A;  . ESOPHAGOGASTRODUODENOSCOPY N/A 03/06/2018   Procedure: ESOPHAGOGASTRODUODENOSCOPY (EGD);  Surgeon: Danie Binder, MD;  [facility];  Service: Endoscopy;  Laterality: N/A;  . TOOTH EXTRACTION N/A 02/01/2017   Procedure: FULL DENTAL EXTRACTIONS;  Surgeon: Diona Browner, DDS;  Location: [facility];  Service: Oral Surgery;  Laterality: N/A;  . TUBAL LIGATION     Social History   Socioeconomic History  . Marital status: Single    Spouse name: Not on file  . Number of children: Not on file  . Years  of education: Not on file  . Highest education level: Not on file  Occupational History  . Not on file  Social Needs  . Financial resource strain: Not on file  . Food insecurity:    Worry: Not on file    Inability: Not on file  . Transportation needs:    Medical: Not on file    Non-medical: Not on file  Tobacco Use  . Smoking status: Current Every Day Smoker    Packs/day: 0.50    Years: 15.00    Pack years: 7.50    Types: Cigarettes  . Smokeless tobacco: Never Used  Substance and Sexual Activity  . Alcohol use: No  . Drug use: No  . Sexual activity: Not Currently  Lifestyle  . Physical activity:    Days per week: Not on file    Minutes per session: Not on file  . Stress: Not on file  Relationships  . Social connections:    Talks on phone: Not on file    Gets together: Not on file    Attends religious service: Not on file    Active member of club or organization: Not on file    Attends meetings of clubs or organizations: Not on file  Relationship status: Not on file  Other Topics Concern  . Not on file  Social History Narrative  . Not on file   Outpatient Encounter Medications as of 07/01/2018  Medication Sig  . acetaminophen (TYLENOL) 500 MG tablet Take 1,000 mg by mouth every 6 (six) hours as needed for moderate pain or headache.  Marland Kitchen amLODipine (NORVASC) 10 MG tablet Take 1 tablet (10 mg total) by mouth daily.  . Blood Glucose Monitoring Suppl (TRUE METRIX METER) w/Device KIT Use as directed to check blood sugar at home.  . cloNIDine (CATAPRES) 0.1 MG tablet Take 1 tablet (0.1 mg total) by mouth 2 (two) times daily.  . Coenzyme Q10 (CO Q 10 PO) Take 1 capsule by mouth daily.  Marland Kitchen docusate sodium (COLACE) 100 MG capsule Take 100 mg by mouth daily as needed for mild constipation.  . famotidine (PEPCID) 10 MG tablet Take 10 mg by mouth daily.  . fluticasone (FLONASE) 50 MCG/ACT nasal spray Place 1 spray into both nostrils daily as needed for allergies or rhinitis.  (Patient taking differently: Place 1 spray into both nostrils 2 (two) times daily. )  . glimepiride (AMARYL) 2 MG tablet Take 1 tablet (2 mg total) by mouth daily before breakfast.  . glucose blood (TRUE METRIX BLOOD GLUCOSE TEST) test strip Use as instructed  . hydroxypropyl methylcellulose / hypromellose (ISOPTO TEARS / GONIOVISC) 2.5 % ophthalmic solution Place 1 drop into both eyes daily.  Marland Kitchen ibuprofen (ADVIL,MOTRIN) 200 MG tablet Take 400-600 mg by mouth every 6 (six) hours as needed for headache or moderate pain.  Marland Kitchen loratadine (CLARITIN) 10 MG tablet Take 10 mg by mouth daily.  Marland Kitchen losartan-hydrochlorothiazide (HYZAAR) 100-25 MG tablet Take 1 tablet by mouth daily.  Marland Kitchen lovastatin (MEVACOR) 20 MG tablet Take 1 tablet (20 mg total) by mouth at bedtime.  . metFORMIN (GLUCOPHAGE) 500 MG tablet Take 1 tablet (500 mg total) by mouth 2 (two) times daily with a meal.  . metoprolol tartrate (LOPRESSOR) 50 MG tablet TAKE 1 TABLET BY MOUTH TWICE DAILY  . Multiple Vitamins-Minerals (MULTIVITAMIN PO) Take 1 tablet by mouth daily.  . Omega-3 Fatty Acids (FISH OIL) 1000 MG CAPS Take 1,000 mg by mouth 2 (two) times daily.   Marland Kitchen omeprazole (PRILOSEC) 20 MG capsule 2 (two) times daily.  . phenazopyridine (PYRIDIUM) 95 MG tablet Take 95 mg by mouth 3 (three) times daily as needed for pain.  . polyethylene glycol (MIRALAX / GLYCOLAX) packet Take 17 g by mouth daily as needed.  . TRUEPLUS LANCETS 28G MISC Use as directed.   No facility-administered encounter medications on file as of 07/01/2018.     ALLERGIES: Allergies  Allergen Reactions  . Aspirin Other (See Comments)    Mouth, upper lip, nose area becomes numb  . Morphine And Related Other (See Comments)    "Did not feel good"  . Penicillins Other (See Comments)    Unknown Has patient had a PCN reaction causing immediate rash, facial/tongue/throat swelling, SOB or lightheadedness with hypotension: Unknown Has patient had a PCN reaction causing severe  rash involving mucus membranes or skin necrosis: Unknown Has patient had a PCN reaction that required hospitalization: No Has patient had a PCN reaction occurring within the last 10 years: No If all of the above answers are "NO", then may proceed with Cephalosporin use.     VACCINATION STATUS:  There is no immunization history on file for this patient.  Diabetes  She presents for her follow-up diabetic visit. She has type  2 diabetes mellitus. Onset time: She reports that she was diagnosed at approximate age of 33 years. Her disease course has been worsening. There are no hypoglycemic associated symptoms. Pertinent negatives for hypoglycemia include no confusion, headaches, pallor or seizures. Associated symptoms include blurred vision, fatigue, polydipsia and polyuria. Pertinent negatives for diabetes include no chest pain and no polyphagia. There are no hypoglycemic complications. Symptoms are improving. Diabetic complications include nephropathy. Risk factors for coronary artery disease include diabetes mellitus, dyslipidemia, family history, hypertension, sedentary lifestyle and tobacco exposure. Current diabetic treatment includes oral agent (dual therapy) (She is currently on metformin 1000 mg p.o. twice daily, glimepiride 2 mg p.o. daily.). Her weight is fluctuating minimally. She is following a generally unhealthy diet. When asked about meal planning, she reported none. She has not had a previous visit with a dietitian. She rarely participates in exercise. (She was supposed to monitor blood glucose 4 times a day.  However, she brings in a meter showing only rare and random blood glucose monitoring, averaging between 100-150.  This is in sharp discrepancy from her recent A1c of 15.4%.   She also missed her lab appointment for repeat labs including A1c.   ) An ACE inhibitor/angiotensin II receptor blocker is being taken.  Hyperlipidemia  This is a chronic problem. The current episode started  more than 1 year ago. Exacerbating diseases include diabetes. Pertinent negatives include no chest pain, myalgias or shortness of breath. Current antihyperlipidemic treatment includes statins. Risk factors for coronary artery disease include family history, dyslipidemia, diabetes mellitus, hypertension, a sedentary lifestyle and post-menopausal.  Hypertension  This is a chronic problem. The current episode started more than 1 year ago. The problem is uncontrolled. Associated symptoms include blurred vision. Pertinent negatives include no chest pain, headaches, palpitations or shortness of breath. Past treatments include angiotensin blockers and central alpha agonists.      Review of Systems  Constitutional: Positive for fatigue. Negative for chills, fever and unexpected weight change.  HENT: Negative for trouble swallowing and voice change.   Eyes: Positive for blurred vision. Negative for visual disturbance.  Respiratory: Negative for cough, shortness of breath and wheezing.   Cardiovascular: Negative for chest pain, palpitations and leg swelling.  Gastrointestinal: Negative for diarrhea, nausea and vomiting.  Endocrine: Positive for polydipsia and polyuria. Negative for cold intolerance, heat intolerance and polyphagia.  Musculoskeletal: Negative for arthralgias and myalgias.  Skin: Negative for color change, pallor, rash and wound.  Neurological: Negative for seizures and headaches.  Psychiatric/Behavioral: Negative for confusion and suicidal ideas.    Objective:    BP (!) 145/74   Pulse 65   Ht _0  (1.702 m)   Wt 175 lb (79.4 kg)   LMP 08/13/2014 (Approximate)   BMI 27.41 kg/m   Wt Readings from Last 3 Encounters:  07/01/18 175 lb (79.4 kg)  05/27/18 180 lb (81.6 kg)  03/07/18 171 lb (77.6 kg)     Physical Exam  Constitutional: She is oriented to person, place, and time. She appears well-developed.  HENT:  Head: Normocephalic and atraumatic.  Eyes: EOM are normal.   Neck: Normal range of motion. Neck supple. No tracheal deviation present. No thyromegaly present.  Cardiovascular: Normal rate and regular rhythm.  Pulmonary/Chest: Effort normal and breath sounds normal.  Abdominal: Soft. Bowel sounds are normal. There is no tenderness. There is no guarding.  Musculoskeletal: Normal range of motion. She exhibits no edema.  Neurological: She is alert and oriented to person, place, and time. She has normal  reflexes. No cranial nerve deficit. Coordination normal.  Skin: Skin is warm and dry. No rash noted. No erythema. No pallor.  Psychiatric: She has a normal mood and affect. Judgment normal.      CMP ( most recent) CMP     Component Value Date/Time   NA 135 03/06/2018 0409   NA 140 04/09/2017 1128   K 3.3 (L) 03/06/2018 0409   CL 101 03/06/2018 0409   CO2 26 03/06/2018 0409   GLUCOSE 167 (H) 03/06/2018 0409   BUN 21 (H) 03/06/2018 0409   BUN 14 04/09/2017 1128   CREATININE 1.09 (H) 03/06/2018 0409   CALCIUM 8.6 (L) 03/06/2018 0409   PROT 7.3 04/09/2017 1128   ALBUMIN 4.4 04/09/2017 1128   AST 14 04/09/2017 1128   ALT 19 04/09/2017 1128   ALKPHOS 87 04/09/2017 1128   BILITOT 0.2 04/09/2017 1128   GFRNONAA 55 (L) 03/06/2018 0409   GFRAA >60 03/06/2018 0409     Diabetic Labs (most recent): Lab Results  Component Value Date   HGBA1C 15.4 (H) 03/06/2018   HGBA1C >15.5 (H) 03/05/2018   HGBA1C 8.4 12/03/2017     Lipid Panel ( most recent) Lipid Panel     Component Value Date/Time   CHOL 304 (H) 04/09/2017 1128   TRIG 319 (H) 04/09/2017 1128   HDL 51 04/09/2017 1128   CHOLHDL 6.0 (H) 04/09/2017 1128   LDLCALC 189 (H) 04/09/2017 1128       Assessment & Plan:   1. Type 2 diabetes mellitus with stage 3 chronic kidney disease, without long-term current use of insulin (HCC)  - Sydney Morrow has currently uncontrolled symptomatic type 2 DM since 57 years of age. -Unfortunately she did not commit for proper monitoring of blood  glucose as ordered and missed her lab appointment.  Her most recent A1c of 15.4 %. Recent labs reviewed.  -her diabetes is complicated by stage 3 renal insufficiency, sedentary life and she remains at extremely high risk for more acute and chronic complications which include CAD, CVA, CKD, retinopathy, and neuropathy. These are all discussed in detail with her.  - I have counseled her on diet management and weight loss, by adopting a carbohydrate restricted/protein rich diet.  -  Suggestion is made for her to avoid simple carbohydrates  from her diet including Cakes, Sweet Desserts / Pastries, Ice Cream, Soda (diet and regular), Sweet Tea, Candies, Chips, Cookies, Store Bought Juices, Alcohol in Excess of  1-2 drinks a day, Artificial Sweeteners, and "Sugar-free" Products. This will help patient to have stable blood glucose profile and potentially avoid unintended weight gain.   - I encouraged her to switch to  unprocessed or minimally processed complex starch and increased protein intake (animal or plant source), fruits, and vegetables.  - she is advised to stick to a routine mealtimes to eat 3 meals  a day and avoid unnecessary snacks ( to snack only to correct hypoglycemia).   - she will be scheduled with Jearld Fenton, RDN, CDE for individualized diabetes education.  - I have approached her with the following individualized plan to manage diabetes and patient agrees:   -Based on her presenting glycemic burden with A1c of 15.4%, she will likely need insulin treatment in order for her to achieve and maintain control of diabetes to target.  -However, she is not displaying appropriate engagement for proper monitoring of blood glucose, and patient is very reluctant to go on insulin treatment.  -She missed her last lab appointment, is  urged to go to lab for repeat labs including A1c, and start monitoring of blood glucose at least 2 times a day-daily before breakfast and at bedtime and return in 2  weeks with her meter and logs for reevaluation.    - she is encouraged to call clinic for blood glucose levels less than 70 or above 300 mg /dl. -Given her CKD, I advised her to remain on lower dose of metformin at 500 mg p.o. twice daily  after breakfast and supper.   -She will be kept on glimepiride 2 mg p.o. daily with breakfast for now.   - she will be considered for incretin therapy as appropriate next visit. - Patient specific target  A1c;  LDL, HDL, Triglycerides, and  Waist Circumference were discussed in detail.  2) BP/HTN:  her blood pressure is not controlled to target.    she is advised to continue her current medications including Hyzaar 100/25 mg p.o. daily, metoprolol 50 mg p.o. twice daily, and amlodipine 10 mg p.o. daily.    3) Lipids/HPL:   Review of her recent lipid panel showed LDL uncontrolled  at 189.  she  is advised to continue lovastatin 20 mg p.o. nightly.  She will have fasting lipid panel on her subsequent visits.  Side effects and precautions discussed with her.  4)  Weight/Diet:  Body mass index is 27.41 kg/m.   I discussed with her the fact that loss of 5 - 10% of her  current body weight will have the most impact on her diabetes management.  CDE Consult will be initiated . Exercise, and detailed carbohydrates information provided  -  detailed on discharge instructions.  5) Chronic Care/Health Maintenance:  -she  is on ACEI/ARB and Statin medications and  is encouraged to initiate and continue to follow up with Ophthalmology, Dentist,  Podiatrist at least yearly or according to recommendations, and advised to quit smoking. I have recommended yearly flu vaccine and pneumonia vaccine at least every 5 years; moderate intensity exercise for up to 150 minutes weekly; and  sleep for at least 7 hours a day.  - I advised patient to maintain close follow up with Gildardo Pounds, NP for primary care needs.  - Time spent with the patient: 25 min, of which >50% was spent in  reviewing her blood glucose meter,  discussing her hypo- and hyper-glycemic episodes, reviewing her current and  previous labs and insulin doses and developing a plan to avoid hypo- and hyper-glycemia. Please refer to Patient Instructions for Blood Glucose Monitoring and Insulin/Medications Dosing Guide"  in media tab for additional information. Sydney Morrow participated in the discussions, expressed understanding, and reluctantly agrees with the plan.  All questions were answered to her satisfaction. she is encouraged to contact clinic should she have any questions or concerns prior to her return visit.   Follow up plan: - Return in about 2 weeks (around 07/15/2018) for Follow up with Pre-visit Labs, Meter, and Logs.  Glade Lloyd, MD [facility] [address] [place], [place] [address] Phone: [phone]  Fax: [phone]    07/01/2018, 4:41 PM  This note was partially dictated with voice recognition software. Similar sounding words can be transcribed inadequately or may not  be corrected upon review.

## 2018-07-04 ENCOUNTER — Other Ambulatory Visit (HOSPITAL_COMMUNITY)
Admission: RE | Admit: 2018-07-04 | Discharge: 2018-07-04 | Disposition: A | Discharge status: Home / Self Care | Payer: Self-pay | Source: Ambulatory Visit | Attending: "Endocrinology | Admitting: "Endocrinology

## 2018-07-04 DIAGNOSIS — N183 Chronic kidney disease, stage 3 (moderate): Secondary | ICD-10-CM | POA: Insufficient documentation

## 2018-07-04 DIAGNOSIS — E782 Mixed hyperlipidemia: Secondary | ICD-10-CM | POA: Insufficient documentation

## 2018-07-04 DIAGNOSIS — E1122 Type 2 diabetes mellitus with diabetic chronic kidney disease: Secondary | ICD-10-CM | POA: Insufficient documentation

## 2018-07-04 LAB — LIPID PANEL
CHOL/HDL RATIO: 4.1 ratio
CHOLESTEROL: 206 mg/dL — AB (ref 0–200)
HDL: 50 mg/dL (ref >40)
LDL Cholesterol: 136 mg/dL — ABNORMAL HIGH (ref 0–99)
TRIGLYCERIDES: 101 mg/dL (ref <150)
VLDL: 20 mg/dL (ref 0–40)

## 2018-07-04 LAB — COMPREHENSIVE METABOLIC PANEL
ALT: 17 U/L (ref 0–44)
AST: 15 U/L (ref 15–41)
Albumin: 4 g/dL (ref 3.5–5.0)
Alkaline Phosphatase: 56 U/L (ref 38–126)
Anion gap: 8 (ref 5–15)
BUN: 26 mg/dL — ABNORMAL HIGH (ref 6–20)
CHLORIDE: 106 mmol/L (ref 98–111)
CO2: 22 mmol/L (ref 22–32)
Calcium: 9.3 mg/dL (ref 8.9–10.3)
Creatinine, Ser: 1.45 mg/dL — ABNORMAL HIGH (ref 0.44–1.00)
GFR, EST AFRICAN AMERICAN: 45 mL/min — AB (ref >60)
GFR, EST NON AFRICAN AMERICAN: 39 mL/min — AB (ref >60)
Glucose, Bld: 110 mg/dL — ABNORMAL HIGH (ref 70–99)
Potassium: 4.9 mmol/L (ref 3.5–5.1)
SODIUM: 136 mmol/L (ref 135–145)
Total Bilirubin: 0.7 mg/dL (ref 0.3–1.2)
Total Protein: 7.1 g/dL (ref 6.5–8.1)

## 2018-07-04 LAB — T4, FREE: Free T4: 0.72 ng/dL — ABNORMAL LOW (ref 0.82–1.77)

## 2018-07-04 LAB — HEMOGLOBIN A1C
HEMOGLOBIN A1C: 6 % — AB (ref 4.8–5.6)
Mean Plasma Glucose: 125.5 mg/dL

## 2018-07-04 LAB — TSH: TSH: 2.243 u[IU]/mL (ref 0.350–4.500)

## 2018-07-05 LAB — VITAMIN D 25 HYDROXY (VIT D DEFICIENCY, FRACTURES): Vit D, 25-Hydroxy: 16.4 ng/mL — ABNORMAL LOW (ref 30.0–100.0)

## 2018-07-05 LAB — MICROALBUMIN / CREATININE URINE RATIO
Creatinine, Urine: 63.5 mg/dL
MICROALB UR: 1036.3 ug/mL — AB
MICROALB/CREAT RATIO: 1632 mg/g{creat} — AB (ref 0.0–30.0)

## 2018-07-07 ENCOUNTER — Other Ambulatory Visit: Payer: Self-pay | Admitting: "Endocrinology

## 2018-07-07 MED ORDER — LEVOTHYROXINE SODIUM 50 MCG PO TABS: 50.0000 ug | ORAL_TABLET | Freq: Every day | ORAL | 3 refills | Status: DC

## 2018-07-07 MED ORDER — VITAMIN D3 125 MCG (5000 UT) PO CAPS: 5000.0000 [IU] | ORAL_CAPSULE | Freq: Every day | ORAL | 0 refills | Status: DC

## 2018-07-21 ENCOUNTER — Telehealth: Payer: Self-pay | Admitting: Nurse Practitioner

## 2018-07-21 ENCOUNTER — Encounter: Payer: Self-pay | Attending: Nurse Practitioner | Admitting: Nutrition

## 2018-07-21 VITALS — Ht 66.0 in | Wt 174.0 lb

## 2018-07-21 DIAGNOSIS — I10 Essential (primary) hypertension: Secondary | ICD-10-CM

## 2018-07-21 DIAGNOSIS — E1165 Type 2 diabetes mellitus with hyperglycemia: Secondary | ICD-10-CM | POA: Insufficient documentation

## 2018-07-21 DIAGNOSIS — IMO0002 Reserved for concepts with insufficient information to code with codable children: Secondary | ICD-10-CM

## 2018-07-21 DIAGNOSIS — E118 Type 2 diabetes mellitus with unspecified complications: Secondary | ICD-10-CM | POA: Insufficient documentation

## 2018-07-21 MED ORDER — AMLODIPINE BESYLATE 10 MG PO TABS: 10.0000 mg | ORAL_TABLET | Freq: Every day | ORAL | 2 refills | Status: DC

## 2018-07-21 NOTE — Patient Instructions (Signed)
Goals 1. Eat three meals per day 2. Don't skip meals 3. Keep drinking water 4. Increase fresh fruits and vegetables. Eat high fiber foods Walk 30 minutes a day

## 2018-07-21 NOTE — Telephone Encounter (Signed)
1) Medication(s) Requested (by name): Losartan amlodipine 2) Pharmacy of Choice:  [facility] in [place]

## 2018-07-21 NOTE — Progress Notes (Signed)
Medical Nutrition Therapy:  Appt start time: 1500 end time:  1600.   Assessment:  Primary concerns today: Diabetes Type 2. Dx this past year.  Lives with her daughter. Eats 2-3 meals per day. Admits to skipping breakfast at times if not hungry and just eating lunch and dinner. Eats later at night at times. Laverle Hobby her fried chicken.  Walks a mile a day. Is busy during the day. FBS 100-120's. Bedtime 120's. Elevated creatine levels noted. LDL elevated.  Metformin 500 mg BID and Glimperide 2 mg daily. Glimepiride not showing on med list. Willing to work on better eating habits and increase physical activity.  Lab Results  Component Value Date   HGBA1C 6.0 (H) 07/04/2018   CMP Latest Ref Rng & Units 07/04/2018 03/06/2018 03/05/2018  Glucose 70 - 99 mg/dL 110(H) 167(H) 710(HH)  BUN 6 - 20 mg/dL 26(H) 21(H) 37(H)  Creatinine 0.44 - 1.00 mg/dL 1.45(H) 1.09(H) 1.52(H)  Sodium 135 - 145 mmol/L 136 135 129(L)  Potassium 3.5 - 5.1 mmol/L 4.9 3.3(L) 4.2  Chloride 98 - 111 mmol/L 106 101 90(L)  CO2 22 - 32 mmol/L 22 26 23   Calcium 8.9 - 10.3 mg/dL 9.3 8.6(L) 8.8(L)  Total Protein 6.5 - 8.1 g/dL 7.1 - -  Total Bilirubin 0.3 - 1.2 mg/dL 0.7 - -  Alkaline Phos 38 - 126 U/L 56 - -  AST 15 - 41 U/L 15 - -  ALT 0 - 44 U/L 17 - -   Lipid Panel     Component Value Date/Time   CHOL 206 (H) 07/04/2018 1007   CHOL 304 (H) 04/09/2017 1128   TRIG 101 07/04/2018 1007   HDL 50 07/04/2018 1007   HDL 51 04/09/2017 1128   CHOLHDL 4.1 07/04/2018 1007   VLDL 20 07/04/2018 1007   LDLCALC 136 (H) 07/04/2018 1007   LDLCALC 189 (H) 04/09/2017 1128       Wt Readings from Last 3 Encounters:  07/21/18 174 lb (78.9 kg)  07/01/18 175 lb (79.4 kg)  05/27/18 180 lb (81.6 kg)   Ht Readings from Last 3 Encounters:  07/21/18 5\' 6"  (1.676 m)  07/01/18 5\' 7"  (1.702 m)  05/27/18 5\' 7"  (1.702 m)   Preferred Learning Style:  No preference indicated   Learning Readiness:   Ready  Change in  progress   MEDICATIONS:    DIETARY INTAKE:  24-hr recall:  B ( AM): Egg. Apple and salmon cake or skipped yesterday Snk ( AM):  L ( PM):  Chicken livers, rice, beets, cabbage, water or unsweet tea Snk ( PM):Crystal light D ( PM): same as lunch Snk ( PM): misc Beverages: water, crystal light  Usual physical activity: Walking 1 mile a day.  Estimated energy needs: 1500  calories 170 g carbohydrates  112 g protein 42 g fat  Progress Towards Goal(s):  In progress.   Nutritional Diagnosis:  NB-1.1 Food and nutrition-related knowledge deficit As related to diabetes   As evidenced by A1C 6.0%  And taking Metformin 500 mg BID    Intervention:  Nutrition and Pre- Diabetes education provided on My Plate, CHO counting, meal planning, portion sizes, timing of meals, avoiding snacks between meals unless having a low blood sugar, target ranges for A1C and blood sugars, signs/symptoms and treatment of hyper/hypoglycemia, monitoring blood sugars, taking medications as prescribed, benefits of exercising 30 minutes per day and prevention of complications of DM. Marland Kitchen Goals 1. Eat three meals per day 2. Don't skip meals 3. Keep drinking water  4. Increase fresh fruits and vegetables. Eat high fiber foods Walk 30 minutes a day  Teaching Method Utilized:  Visual Auditory Hands on  Handouts given during visit include:  The Plate Method   Meal Plan Card    Barriers to learning/adherence to lifestyle change: none  Demonstrated degree of understanding via:  Teach Back   Monitoring/Evaluation:  Dietary intake, exercise, meal planing , and body weight in 3 month(s).

## 2018-07-21 NOTE — Telephone Encounter (Signed)
Sent RX for amlodipine, there should be enough refills on losartan to last until next July with her pharmacy.

## 2018-07-22 ENCOUNTER — Encounter: Payer: Self-pay | Admitting: Nutrition

## 2018-07-22 ENCOUNTER — Ambulatory Visit: Payer: Self-pay | Admitting: "Endocrinology

## 2018-07-28 NOTE — Telephone Encounter (Signed)
What about the losartan? Please give patient a call.

## 2018-07-29 NOTE — Telephone Encounter (Signed)
The pharmacy had a prescription on file for Losartan-HCTZ which is currently on back order, they were given a verbal 'ok' to switch the 03/07/18 prescription to two separate orders one losartan 100 and one HCTZ 25 with identical instructions and refills as the 03/07/18 prescription as it had not been filled yet. They will notify the patient when the prescriptions are ready for pickup.

## 2018-07-29 NOTE — Telephone Encounter (Signed)
[facility] is saying there is no losartan

## 2018-07-29 NOTE — Telephone Encounter (Signed)
As stated in my original note the patient has enough refills of Losartan to last until July at her pharmacy, she should contact them for refills.

## 2018-08-18 ENCOUNTER — Ambulatory Visit: Payer: Self-pay | Admitting: Pharmacist

## 2018-08-18 ENCOUNTER — Ambulatory Visit: Payer: Self-pay | Admitting: "Endocrinology

## 2018-08-28 ENCOUNTER — Ambulatory Visit: Payer: Self-pay | Admitting: Gastroenterology

## 2018-09-02 IMAGING — CT CT MAXILLOFACIAL W/ CM
3 of 4 series · 14 of 47 positions shown, 17 images · IV contrast (Isovue)
Comparison: None.

CLINICAL DATA: 55-year-old female with right facial swelling,
difficulty swallowing this morning. No known injury.

CONTRAST:  75 mL [id number]
EXAM:
CT MAXILLOFACIAL WITH CONTRAST
TECHNIQUE: Multidetector CT imaging of the maxillofacial structures was
performed with IV contrast. Multiplanar CT image reconstructions
were also generated. A small metallic BB was placed on the right
temple in order to reliably differentiate right from left.

[Series 2: max soft · axial · 0.49mm/px · z∈[+142,+288]mm · 9 of 85 slices shown, 12 images]
[im 6/85  brain]
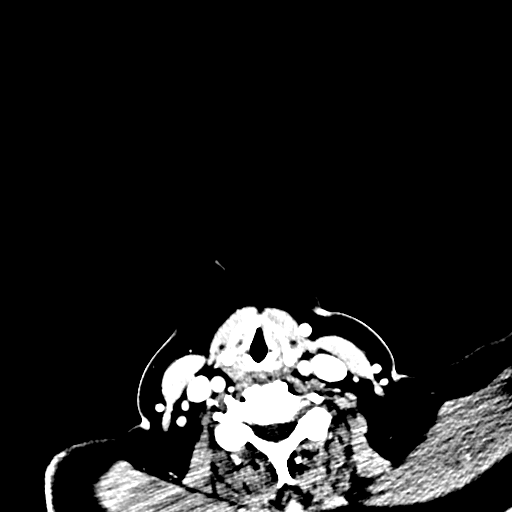
[im 6/85  bone]
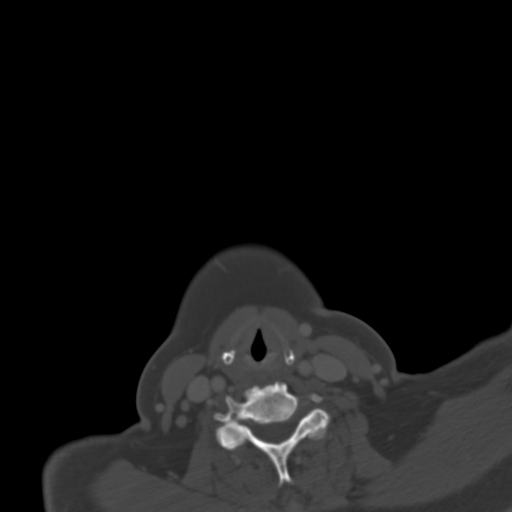
[im 15/85  bone]
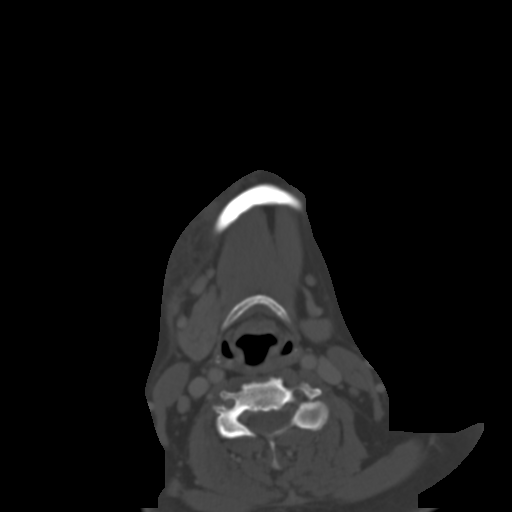
[im 24/85  bone]
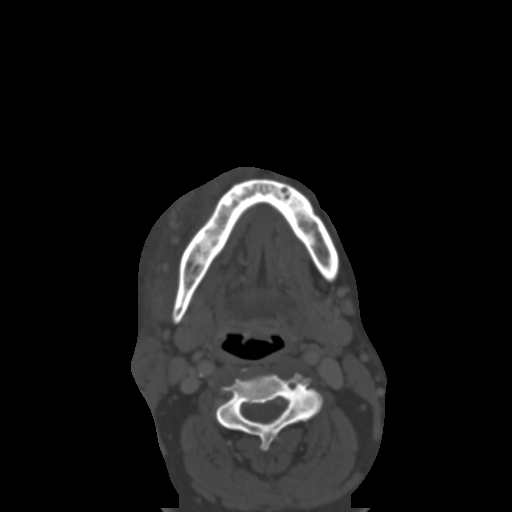
[im 32/85  bone]
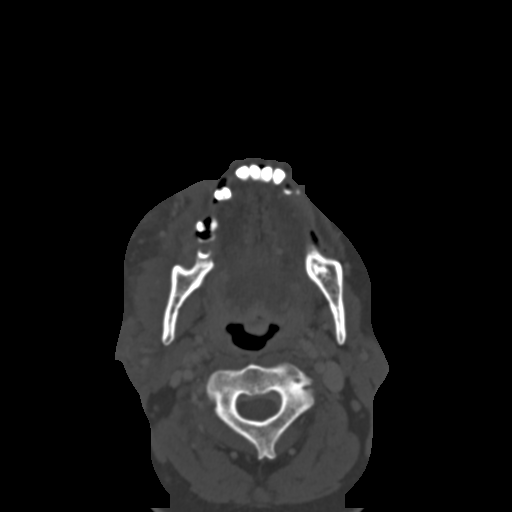
[im 44/85  brain]
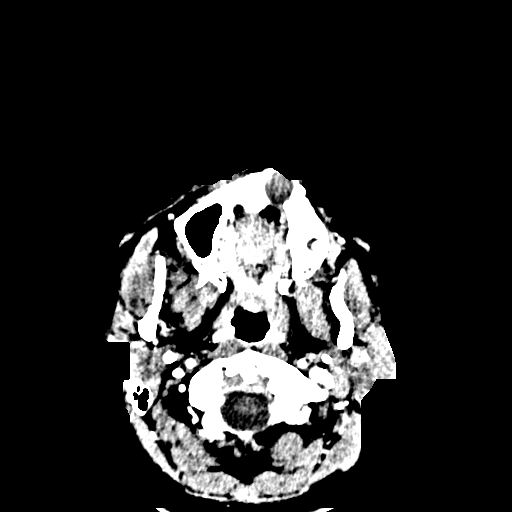
[im 44/85  bone]
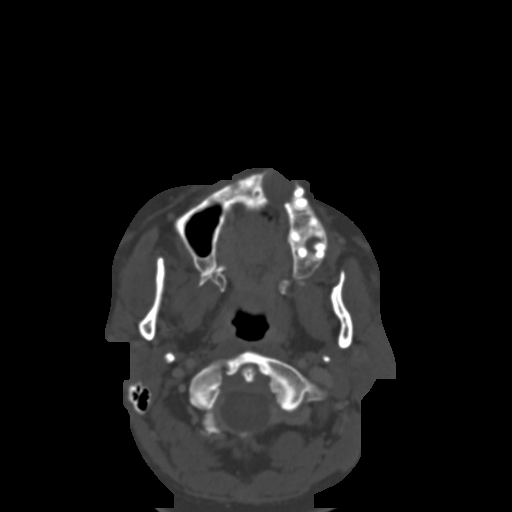
[im 53/85  bone]
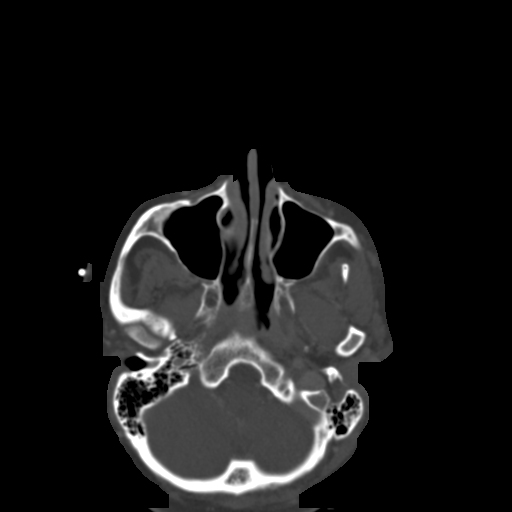
[im 61/85  bone]
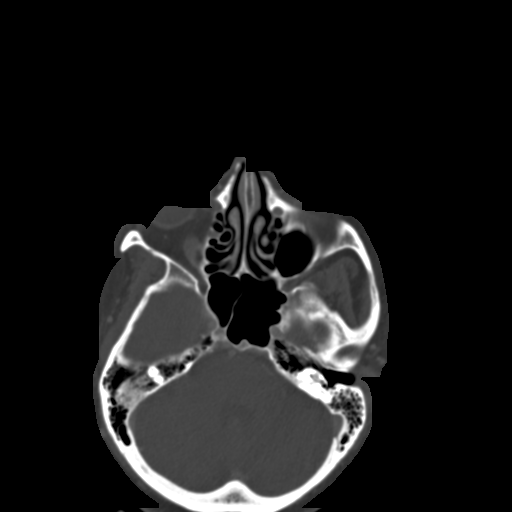
[im 70/85  bone]
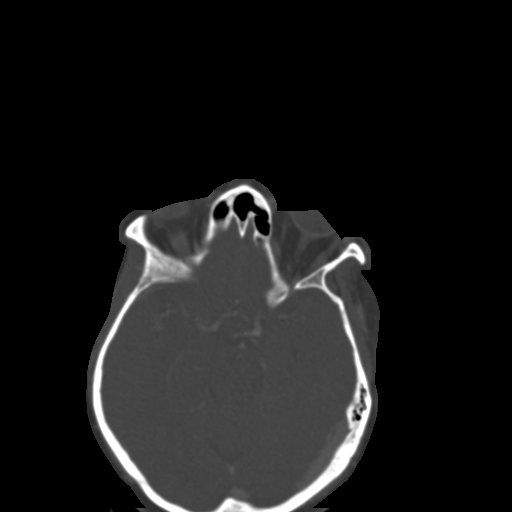
[im 79/85  brain]
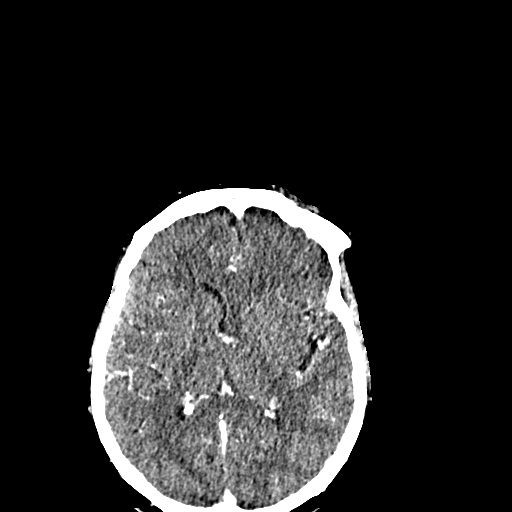
[im 79/85  bone]
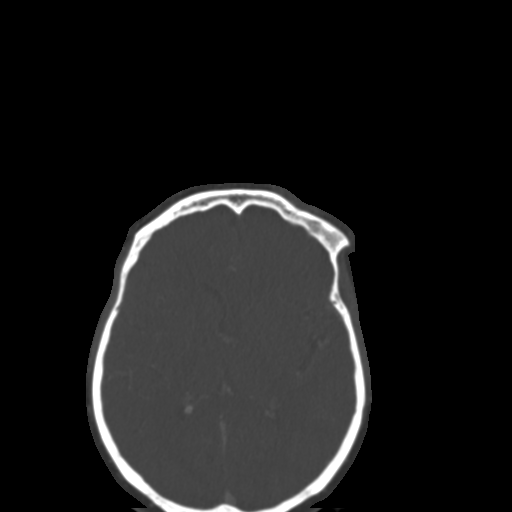

[Series 6: coronal soft · coronal · 0.39mm/px · 3 of 81 slices shown]
[im 27/81  bone]
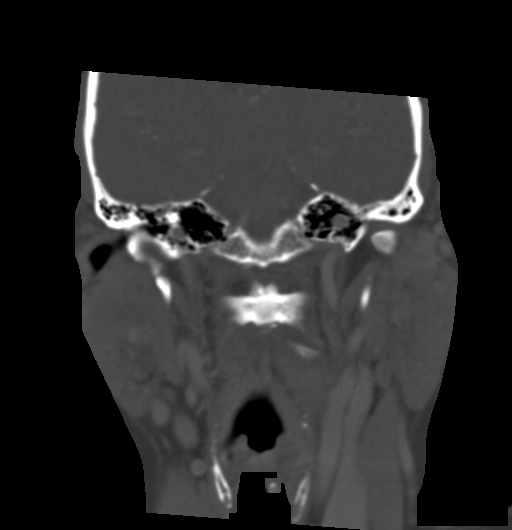
[im 36/81  bone]
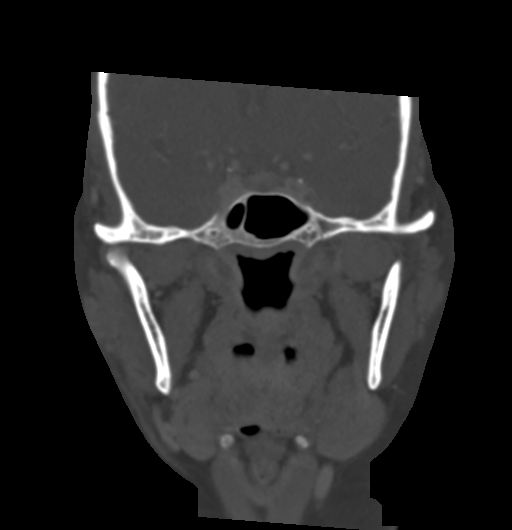
[im 45/81  bone]
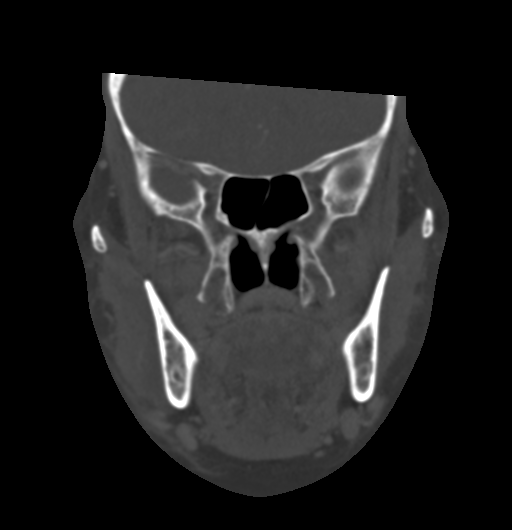

[Series 9: sagittal bone · sagittal · 0.35mm/px · 2 of 86 slices shown]
[im 29/86  bone]
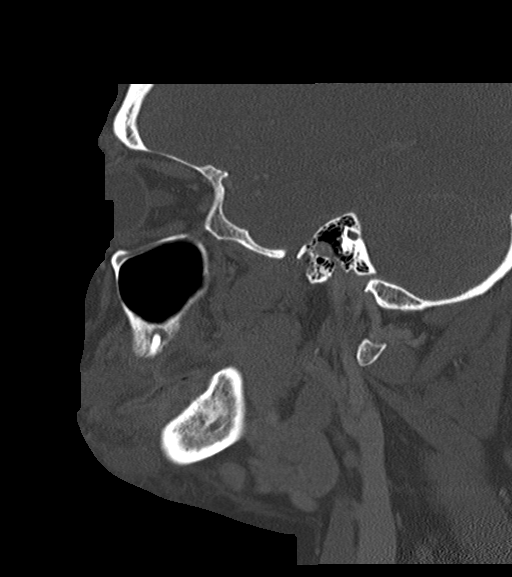
[im 57/86  bone]
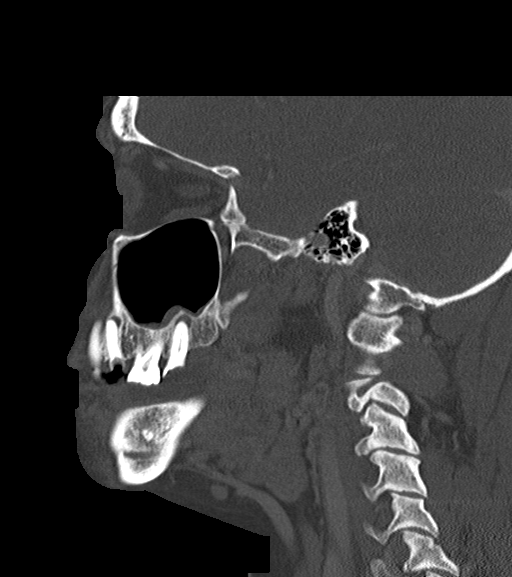

[14 of 47 positions shown; findings below may reference images not displayed]

FINDINGS: Osseous: Poor dentition. Dental caries and periapical lucency along
the residual right mandible molar with what appears to be a small
area of lateral cortical breakthrough (series 7, image 28 and series
3, image 57) with a small 3 x 9 mm adjacent periosteal abscess
(series 2, images 58 and 59).

Multiple maxillary dentition periapical lucencies, including a large
18 mm left incisor region maxilla cyst (series 2, image 53 and
series 7, image 13).

No superimposed acute facial fracture.  Visible calvarium intact.

Orbits: Bilateral orbits soft tissues are within normal limits.
Orbital walls are intact.

Sinuses: Clear aside from mild mucosal thickening in the left
maxillary alveolar recess. Bilateral tympanic cavities, mastoids,
and petrous apex air cells are clear.

Soft tissues: Negative visualized larynx, pharynx, parapharyngeal
spaces, retropharyngeal space and sublingual space. Left
submandibular and parotid spaces are normal.

Along the posterior body of the right mandible there is asymmetric
soft tissue thickening and stranding which continues superficially
to the right subcutaneous soft tissues along the right jaw. Small
periosteal abscess noted as detailed above. There is asymmetric
thickening of the right platysma. There is moderate asymmetric
enlargement of the soft tissues of the right masticator space,
including the right masseter muscle which appears heterogeneous and
edematous (series 2, image 55). There is some secondary involvement
of the right submandibular space and anterior right parotid space.

There are reactive appearing right level Ib and right level IIa
lymph nodes individually measuring up to 9 mm short axis. No cystic
or necrotic nodes.

Limited intracranial: Negative visualized brain parenchyma.

Major vascular structures in the neck and at the skullbase are
patent, including the right IJ, and the cavernous sinus.
IMPRESSION: 1. Multi-spatial soft tissue infection of the right face appears to
be odontogenic in origin. There is a very small periosteal abscess
along the lateral aspect of the roots of the residual right mandible
molar.
2. There is associated moderate to severe inflammation affecting the
right masticator space, portions of the right submandibular and
anterior parotid spaces, and superficial soft tissues of the right
face. No other soft tissue abscess identified.
3. Reactive right cervical lymphadenopathy.  No suppurative nodes.
4. Diffuse poor dentition. Prominent 18 mm probably chronic
postinflammatory cyst of the left maxilla alveolar process.

## 2018-09-03 ENCOUNTER — Ambulatory Visit: Payer: Self-pay | Admitting: Pharmacist

## 2018-09-03 IMAGING — DX DG ORTHOPANTOGRAM /PANORAMIC
1 series · 1 of 1 positions shown · non-contrast
Comparison: Maxillofacial CT 01/29/2017.

CLINICAL DATA: Cellulitis and abscess of oral soft tissues since
yesterday, right worse than left, no injury.

EXAM:
ORTHOPANTOGRAM/PANORAMIC

[view not recorded]
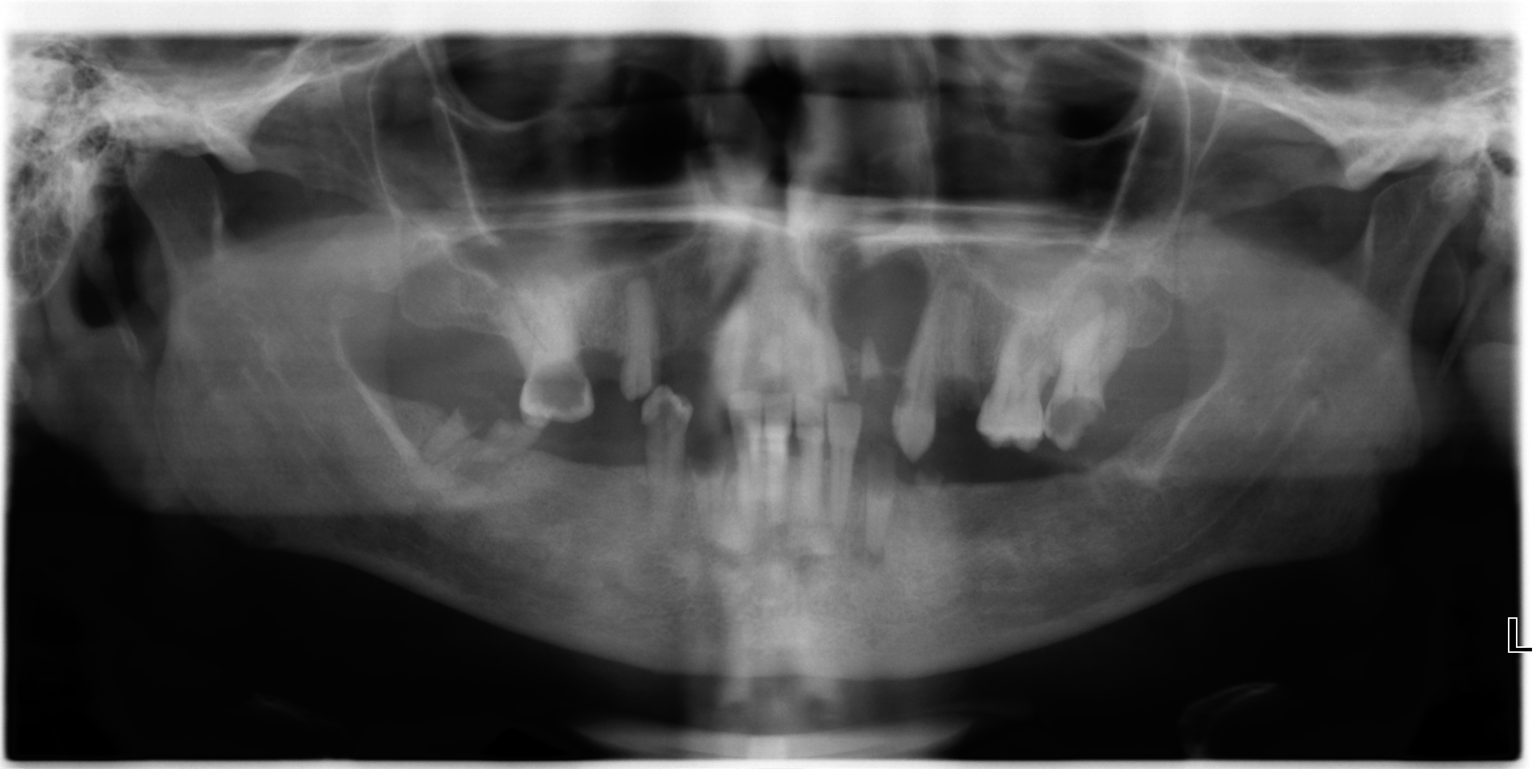

[1 of 1 positions shown; findings below may reference images not displayed]

FINDINGS: Midline artifact typical of Panorex technique. Multiple teeth are
absent. As seen on CT, there is poor dentition with multiple dental
caries and periapical lucencies. Largest lucency involves the left
maxillary incisor. No evidence of acute fracture, dislocation or
bone destruction.
IMPRESSION: Stable radiographic appearance compared with recent CT. Poor
dentition with multiple dental caries and periapical abscesses.

## 2018-09-10 ENCOUNTER — Encounter: Payer: Self-pay | Admitting: Pharmacist

## 2018-09-10 ENCOUNTER — Ambulatory Visit: Payer: Self-pay | Attending: Family Medicine | Admitting: Pharmacist

## 2018-09-10 DIAGNOSIS — Z79899 Other long term (current) drug therapy: Secondary | ICD-10-CM | POA: Insufficient documentation

## 2018-09-10 DIAGNOSIS — Z9114 Patient's other noncompliance with medication regimen: Secondary | ICD-10-CM | POA: Insufficient documentation

## 2018-09-10 DIAGNOSIS — I10 Essential (primary) hypertension: Secondary | ICD-10-CM | POA: Insufficient documentation

## 2018-09-10 DIAGNOSIS — Z7984 Long term (current) use of oral hypoglycemic drugs: Secondary | ICD-10-CM | POA: Insufficient documentation

## 2018-09-10 DIAGNOSIS — F17210 Nicotine dependence, cigarettes, uncomplicated: Secondary | ICD-10-CM | POA: Insufficient documentation

## 2018-09-10 DIAGNOSIS — E119 Type 2 diabetes mellitus without complications: Secondary | ICD-10-CM | POA: Insufficient documentation

## 2018-09-10 LAB — GLUCOSE, POCT (MANUAL RESULT ENTRY): POC GLUCOSE: 118 mg/dL — AB (ref 70–99)

## 2018-09-10 MED ORDER — LORATADINE 10 MG PO TABS: 10.0000 mg | ORAL_TABLET | Freq: Every day | ORAL | 0 refills | Status: DC | PRN

## 2018-09-10 MED ORDER — LOSARTAN POTASSIUM-HCTZ 100-25 MG PO TABS: 1.0000 | ORAL_TABLET | Freq: Every day | ORAL | 2 refills | Status: DC

## 2018-09-10 MED ORDER — METFORMIN HCL ER 500 MG PO TB24: 1000.0000 mg | ORAL_TABLET | Freq: Every day | ORAL | 2 refills | Status: DC

## 2018-09-10 MED ORDER — ATORVASTATIN CALCIUM 40 MG PO TABS: 40.0000 mg | ORAL_TABLET | Freq: Every day | ORAL | 2 refills | Status: DC

## 2018-09-10 NOTE — Patient Instructions (Signed)
Thank you for coming to see me today. Please do the following:  1. Stop glimepiride.  2. Start metformin 500mg  XR. Take 2 tablets in the morning after breakfast. 3. Stop lovastatin. Start atorvastatin 40 mg. Take 1 tablet every day. 4. Continue checking blood sugars at home. It's really important that you record these and bring these in to your next doctor's appointment. If you get in readings above 500 or lower than 70, call me or the clinic to let your doctor know. See below on how to treat low blood sugar.  5. Continue making the lifestyle changes we've discussed together during our visit. Diet and exercise play a significant role in improving your blood sugars.  6. Follow-up with me in 1 month.   Hypoglycemia or low blood sugar:   Low blood sugar can happen quickly and may become an emergency if not treated right away.   While this shouldn't happen often, it can be brought upon if you skip a meal or do not eat enough. Also, if your insulin or other diabetes medications are dosed too high, this can cause your blood sugar to go to low.   Warning signs of low blood sugar include: 1. Feeling shaky or dizzy 2. Feeling weak or tired  3. Excessive hunger 4. Feeling anxious or upset  5. Sweating even when you aren't exercising  What to do if I experience low blood sugar? 1. Check your blood sugar with your meter. If lower than 70, proceed to step 2.  2. Treat with 3-4 glucose tablets or 3 packets of regular sugar. If these aren't around, you can try hard candy. Yet another option would be to drink 4 ounces of fruit juice or 6 ounces of REGULAR soda.  3. Re-check your sugar in 15 minutes. If it is still below 70, do what you did in step 2 again. If has come back up, go ahead and eat a snack or small meal at this time.

## 2018-09-10 NOTE — Progress Notes (Signed)
S:    PCP: Feming  No chief complaint on file.  Patient arrives in good spirits. Presents for diabetes management at the request of Zelda. Patient was referred on 03/07/18.  I last saw her on 03/28/18 - pt was supposed to follow-up with me but missed several appointments/reschedules.   Family/Social History:  -FHx: no pertinent positives -Tobacco: 0.5 PPD (15 yrs) -Alcohol: does not use  Insurance coverage/medication affordability: self-pay  Patient denies adherence with medications.  Current diabetes medications include: metformin 500 mg BID (reports commonly missing the evening dose) Current hypertension medications include: Hyzaar 100-25 mg daily, amlodipine 10 mg daily, clonidine 0.2 mg BID  Patient denies hypoglycemic events. No readings at home < 70.   Patient reported dietary habits:  - Denies sweets; drinks a lot of water - occasional Sun Drop - Incorporates vegetables  - Limits carbohydrates  Patient-reported exercise habits: walks daily   Patient denies polyuria, polyphagia, and polydipsia.  Patient reports baseline neuropathy. Patient denies visual changes. Patient reports self foot exams.   O:  POCT: 118 Home fasting CBG: low 100s-120s  2 hour post-prandial/random CBG: 140s-160s.  Lab Results  Component Value Date   HGBA1C 6.0 (H) 07/04/2018   There were no vitals filed for this visit.  Lipid Panel     Component Value Date/Time   CHOL 206 (H) 07/04/2018 1007   CHOL 304 (H) 04/09/2017 1128   TRIG 101 07/04/2018 1007   HDL 50 07/04/2018 1007   HDL 51 04/09/2017 1128   CHOLHDL 4.1 07/04/2018 1007   VLDL 20 07/04/2018 1007   LDLCALC 136 (H) 07/04/2018 1007   LDLCALC 189 (H) 04/09/2017 1128   Clinical ASCVD: No  The 10-year ASCVD risk score Mikey Bussing DC Jr., et al., 2013) is: 38.5%   Values used to calculate the score:     Age: 58 years     Sex: Female     Is Non-Hispanic African American: Yes     Diabetic: Yes     Tobacco smoker: Yes  Systolic Blood Pressure: 416 mmHg     Is BP treated: Yes     HDL Cholesterol: 50 mg/dL     Total Cholesterol: 206 mg/dL   A/P:  Diabetes: Longstanding currently controlled based on A1c and glucose levels. Patient is able to verbalize appropriate hypoglycemia management plan. Patient is not adherent with medication. She commonly omits evening dose of metformin - will have her take two XR tablets in the morning (TDD 1g/day d/t renal function). Last GFR of 45 - labs ordered today and should be followed closely. If GFR continues to decline or drops < 30 we will have to stop metformin. -Started metformin 500 mg XR: two tablets in the morning   -CMP14 +GFR -Extensively discussed pathophysiology of DM, recommended lifestyle interventions, dietary effects on glycemic control -Counseled on s/sx of and management of hypoglycemia -Next A1C anticipated 12/2018.   ASCVD risk: Primary prevention in patient with DM. Last LDL is not controlled. ASCVD risk score is >20%  - high intensity statin indicated. -Stopped lovastatin -Started atorvastatin 40 mg.   Hypertension: Longstanding but not addressed at this visit - priority given to DM. Will address at follow-up. BP goal <130/80 mmHg. Patient is adherent with medication. Of note, she has significant microalbuminuria. Encouraged compliance with Hyzaar and other BP meds. Will recheck GFR today to ensure that her creatinine has not worsened. Electrolytes WNL (07/04/18).  Written patient instructions provided. Total time in face to face counseling 15 minutes.  Follow up Pharmacist Clinic Visit in 1 month.     Patient seen with: Beckey Rutter, PharmD Candidate  [facility] [employer] [facility]  Class of 2022  Benard Halsted, PharmD, [place] [phone]

## 2018-09-11 LAB — CMP14+EGFR
A/G RATIO: 1.6 (ref 1.2–2.2)
ALT: 16 IU/L (ref 0–32)
AST: 15 IU/L (ref 0–40)
Albumin: 4.2 g/dL (ref 3.8–4.9)
Alkaline Phosphatase: 78 IU/L (ref 39–117)
BUN/Creatinine Ratio: 19 (ref 9–23)
BUN: 27 mg/dL — ABNORMAL HIGH (ref 6–24)
Bilirubin Total: 0.2 mg/dL (ref 0.0–1.2)
CO2: 23 mmol/L (ref 20–29)
CREATININE: 1.42 mg/dL — AB (ref 0.57–1.00)
Calcium: 10.2 mg/dL (ref 8.7–10.2)
Chloride: 100 mmol/L (ref 96–106)
GFR, EST AFRICAN AMERICAN: 47 mL/min/{1.73_m2} — AB (ref >59)
GFR, EST NON AFRICAN AMERICAN: 41 mL/min/{1.73_m2} — AB (ref >59)
GLOBULIN, TOTAL: 2.7 g/dL (ref 1.5–4.5)
Glucose: 92 mg/dL (ref 65–99)
POTASSIUM: 5.2 mmol/L (ref 3.5–5.2)
SODIUM: 138 mmol/L (ref 134–144)
TOTAL PROTEIN: 6.9 g/dL (ref 6.0–8.5)

## 2018-09-15 ENCOUNTER — Telehealth: Payer: Self-pay

## 2018-09-15 NOTE — Telephone Encounter (Signed)
-----   Message from Charlott Rakes, MD sent at 09/11/2018  1:22 PM EST ----- Labs are stable

## 2018-09-15 NOTE — Telephone Encounter (Signed)
Patient was called and informed of lab results via voicemail. 

## 2018-09-17 ENCOUNTER — Telehealth: Payer: Self-pay

## 2018-09-17 NOTE — Telephone Encounter (Signed)
Patient would like a new medication for GERD. Omeprazole gives a gas.

## 2018-09-18 ENCOUNTER — Other Ambulatory Visit: Payer: Self-pay | Admitting: Nurse Practitioner

## 2018-09-18 MED ORDER — PANTOPRAZOLE SODIUM 20 MG PO TBEC: 20.0000 mg | DELAYED_RELEASE_TABLET | Freq: Two times a day (BID) | ORAL | 3 refills | Status: DC

## 2018-09-18 NOTE — Telephone Encounter (Signed)
Script has been sent.

## 2018-10-10 ENCOUNTER — Encounter: Payer: Self-pay | Admitting: Pharmacist

## 2018-10-10 ENCOUNTER — Ambulatory Visit: Payer: Self-pay | Attending: Family Medicine | Admitting: Pharmacist

## 2018-10-10 DIAGNOSIS — E119 Type 2 diabetes mellitus without complications: Secondary | ICD-10-CM

## 2018-10-10 LAB — GLUCOSE, POCT (MANUAL RESULT ENTRY): POC Glucose: 121 mg/dl — AB (ref 70–99)

## 2018-10-10 NOTE — Progress Notes (Signed)
    S:    PCP: Feming  No chief complaint on file.  Patient arrives in good spirits. Presents for diabetes management at the request of Zelda. Patient was referred on 03/07/18.  I last saw her on 09/10/18 - switched her to XR metformin to promote adherence.   Family/Social History:  -FHx: no pertinent positives -Tobacco: 0.5 PPD (15 yrs)  -Alcohol: does not use  Insurance coverage/medication affordability: self-pay  Patient reports adherence with medications.  Current diabetes medications include: metformin 500 mg XR: 2 tablets in the morning Current hypertension medications include: Hyzaar 100-25 mg daily, amlodipine 10 mg daily, clonidine 0.2 mg BID  Patient denies hypoglycemic events. No readings at home < 70.   Patient reported dietary habits:  - Denies sweets; drinks a lot of water  - Incorporates vegetables  - Limits carbohydrates  Patient-reported exercise habits: walks daily at work; nothing outside   Patient denies polyuria, polyphagia, and polydipsia.  Patient reports baseline neuropathy. Patient denies visual changes. Patient reports self foot exams.   O:  POCT: 121 Home fasting CBG: Reports 90s-low100s; denies any readings >130  Lab Results  Component Value Date   HGBA1C 6.0 (H) 07/04/2018   There were no vitals filed for this visit.  Lipid Panel     Component Value Date/Time   CHOL 206 (H) 07/04/2018 1007   CHOL 304 (H) 04/09/2017 1128   TRIG 101 07/04/2018 1007   HDL 50 07/04/2018 1007   HDL 51 04/09/2017 1128   CHOLHDL 4.1 07/04/2018 1007   VLDL 20 07/04/2018 1007   LDLCALC 136 (H) 07/04/2018 1007   LDLCALC 189 (H) 04/09/2017 1128   Clinical ASCVD: No  The 10-year ASCVD risk score Mikey Bussing DC Jr., et al., 2013) is: 38.5%   Values used to calculate the score:     Age: 58 years     Sex: Female     Is Non-Hispanic African American: Yes     Diabetic: Yes     Tobacco smoker: Yes     Systolic Blood Pressure: 277 mmHg     Is BP treated: Yes  HDL Cholesterol: 50 mg/dL     Total Cholesterol: 206 mg/dL   A/P:  Diabetes: Longstanding currently controlled based on A1c and glucose levels. Patient is able to verbalize appropriate hypoglycemia management plan. Patient is adherent with medication and is tolerating well. -Continued metformin 500 mg XR: two tablets in the morning   -Extensively discussed pathophysiology of DM, recommended lifestyle interventions, dietary effects on glycemic control -Counseled on s/sx of and management of hypoglycemia -Next A1C anticipated 12/2018.   ASCVD risk: Primary prevention in patient with DM. Last LDL is not controlled. ASCVD risk score is >20%  - high intensity statin indicated. Tolerating atorvastatin well. Continued atorvastatin 40 mg.   Written patient instructions provided. Total time in face to face counseling 15 minutes.   Follow up with PCP.  Patient seen with: Beckey Rutter, PharmD Candidate  [facility]  Class of 2022  Benard Halsted, PharmD, [place] [phone]

## 2018-10-10 NOTE — Patient Instructions (Addendum)
Thank you for coming to see me today. Please do the following:  1. Continue metformin XR 500 mg: 2 tablets in the morning. 2. Continue atorvastatin 40 mg: 1 tablet daily. 3. Continue your blood pressure medications.  4. Continue checking blood sugars at home.  5. Continue making the lifestyle changes we've discussed together during our visit. Diet and exercise play a significant role in improving your blood sugars.  6. Follow-up with your primary doctor for the pain in your back/leg.    Hypoglycemia or low blood sugar:   Low blood sugar can happen quickly and may become an emergency if not treated right away.   While this shouldn't happen often, it can be brought upon if you skip a meal or do not eat enough. Also, if your insulin or other diabetes medications are dosed too high, this can cause your blood sugar to go to low.   Warning signs of low blood sugar include: 1. Feeling shaky or dizzy 2. Feeling weak or tired  3. Excessive hunger 4. Feeling anxious or upset  5. Sweating even when you aren't exercising  What to do if I experience low blood sugar? 1. Check your blood sugar with your meter. If lower than 70, proceed to step 2.  2. Treat with 3-4 glucose tablets or 3 packets of regular sugar. If these aren't around, you can try hard candy. Yet another option would be to drink 4 ounces of fruit juice or 6 ounces of REGULAR soda.  3. Re-check your sugar in 15 minutes. If it is still below 70, do what you did in step 2 again. If has come back up, go ahead and eat a snack or small meal at this time.

## 2018-10-13 ENCOUNTER — Ambulatory Visit: Payer: Self-pay | Admitting: Nurse Practitioner

## 2018-10-17 ENCOUNTER — Other Ambulatory Visit: Payer: Self-pay | Admitting: Family Medicine

## 2018-10-31 ENCOUNTER — Other Ambulatory Visit: Payer: Self-pay

## 2018-10-31 ENCOUNTER — Encounter: Payer: Self-pay | Admitting: Nurse Practitioner

## 2018-10-31 ENCOUNTER — Ambulatory Visit: Payer: Self-pay | Attending: Nurse Practitioner | Admitting: Nurse Practitioner

## 2018-10-31 VITALS — BP 168/91 | HR 73 | Temp 98.8°F | Ht 67.0 in | Wt 177.0 lb

## 2018-10-31 DIAGNOSIS — E1122 Type 2 diabetes mellitus with diabetic chronic kidney disease: Secondary | ICD-10-CM

## 2018-10-31 DIAGNOSIS — M25551 Pain in right hip: Secondary | ICD-10-CM

## 2018-10-31 DIAGNOSIS — E1169 Type 2 diabetes mellitus with other specified complication: Secondary | ICD-10-CM

## 2018-10-31 DIAGNOSIS — J3089 Other allergic rhinitis: Secondary | ICD-10-CM

## 2018-10-31 DIAGNOSIS — N183 Chronic kidney disease, stage 3 unspecified: Secondary | ICD-10-CM

## 2018-10-31 DIAGNOSIS — E119 Type 2 diabetes mellitus without complications: Secondary | ICD-10-CM

## 2018-10-31 DIAGNOSIS — Z0000 Encounter for general adult medical examination without abnormal findings: Secondary | ICD-10-CM

## 2018-10-31 DIAGNOSIS — K219 Gastro-esophageal reflux disease without esophagitis: Secondary | ICD-10-CM

## 2018-10-31 DIAGNOSIS — E039 Hypothyroidism, unspecified: Secondary | ICD-10-CM

## 2018-10-31 DIAGNOSIS — I10 Essential (primary) hypertension: Secondary | ICD-10-CM

## 2018-10-31 LAB — POCT ABI - SCREENING FOR PILOT NO CHARGE
IMABIL: 1.19
IMABIR: 1.03

## 2018-10-31 LAB — GLUCOSE, POCT (MANUAL RESULT ENTRY): POC Glucose: 153 mg/dl — AB (ref 70–99)

## 2018-10-31 MED ORDER — GLUCOSE BLOOD VI STRP: ORAL_STRIP | 12 refills | Status: DC

## 2018-10-31 MED ORDER — GABAPENTIN 100 MG PO CAPS: 100.0000 mg | ORAL_CAPSULE | Freq: Three times a day (TID) | ORAL | 3 refills | Status: DC

## 2018-10-31 MED ORDER — CLONIDINE HCL 0.1 MG PO TABS: 0.1000 mg | ORAL_TABLET | Freq: Two times a day (BID) | ORAL | 1 refills | Status: DC

## 2018-10-31 MED ORDER — PANTOPRAZOLE SODIUM 20 MG PO TBEC: 20.0000 mg | DELAYED_RELEASE_TABLET | Freq: Two times a day (BID) | ORAL | 3 refills | Status: DC

## 2018-10-31 MED ORDER — FLUTICASONE PROPIONATE 50 MCG/ACT NA SUSP: 2.0000 | Freq: Every day | NASAL | 1 refills | Status: DC

## 2018-10-31 MED ORDER — METOPROLOL TARTRATE 50 MG PO TABS: 50.0000 mg | ORAL_TABLET | Freq: Two times a day (BID) | ORAL | 1 refills | Status: DC

## 2018-10-31 NOTE — Progress Notes (Signed)
Assessment & Plan:  Sydney Morrow was seen today for leg pain.  Diagnoses and all orders for this visit:  Hip pain, acute, right -     gabapentin (NEURONTIN) 100 MG capsule; Take 1 capsule (100 mg total) by mouth 3 (three) times daily. Work on losing weight to help reduce joint pain. May alternate with heat and ice application for pain relief. May also alternate with acetaminophen and Ibuprofen as prescribed pain relief. Other alternatives include massage, acupuncture and water aerobics.  You must stay active and avoid a sedentary lifestyle.  Type 2 diabetes mellitus with other specified complication, without long-term current use of insulin (HCC) -     Glucose (CBG) -     POCT ABI Screening Pilot No Charge -     glucose blood (TRUE METRIX BLOOD GLUCOSE TEST) test strip; Use as instructed -     CMP14+EGFR -     Ambulatory referral to Ophthalmology Diabetes is poorly controlled. Advised patient to keep a fasting blood sugar log fast, 2 hours post lunch and bedtime which will be reviewed at the next office visit.  Essential hypertension -     cloNIDine (CATAPRES) 0.1 MG tablet; Take 1 tablet (0.1 mg total) by mouth 2 (two) times daily. -     metoprolol tartrate (LOPRESSOR) 50 MG tablet; Take 1 tablet (50 mg total) by mouth 2 (two) times daily. -     CMP14+EGFR -     losartan-hydrochlorothiazide (HYZAAR) 100-25 MG tablet; Take 1 tablet by mouth daily. -     amLODipine (NORVASC) 10 MG tablet; Take 1 tablet (10 mg total) by mouth daily. Continue all antihypertensives as prescribed.  Remember to bring in your blood pressure log with you for your follow up appointment.  DASH/Mediterranean Diets are healthier choices for HTN.    Allergic rhinitis due to other allergic trigger, unspecified seasonality -     fluticasone (FLONASE) 50 MCG/ACT nasal spray; Place 2 sprays into both nostrils daily for 30 days.  Hypothyroidism, unspecified type -     TSH  Routine adult health maintenance -      Hepatitis C Antibody -     VITAMIN D 25 Hydroxy (Vit-D Deficiency, Fractures)  Gastroesophageal reflux disease, esophagitis presence not specified -     pantoprazole (PROTONIX) 20 MG tablet; Take 1 tablet (20 mg total) by mouth 2 (two) times daily before a meal for 30 days. INSTRUCTIONS: Avoid GERD Triggers: acidic, spicy or fried foods, caffeine, coffee, sodas,  alcohol and chocolate.    Type 2 diabetes mellitus with stage 3 chronic kidney disease, without long-term current use of insulin (HCC) -     CMP14+EGFR -     CBC    Patient has been counseled on age-appropriate routine health concerns for screening and prevention. These are reviewed and up-to-date. Referrals have been placed accordingly. Immunizations are up-to-date or declined.    Subjective:   Chief Complaint  Patient presents with  . Leg Pain    Pt. is here for right leg pain, been going for a month. Pt. stated she bumped against a table.    Sydney Morrow 58 y.o. female presents to office today with complaints of right leg pain. She sees Endocrinology Dr. [name] for poorly controlled DM. I have not seen her in this clinic since July 2019. She has seen the pharmacist her for blood pressure monitoring with her last appointment 10-10-2018. She has a history of HTN, hypothyroidism, GERD and allergic rhinitis.  States she had her mammogram and PAP smear performed no more than 2 years ago at the [facility] in [place]. Reports both as being normal. She lives in [place].    Joint/Muscle Pain: Patient complains of arthralgias and myalgias for which has been present for 1 month. Pain is located in The right hip, is described as shooting and Nagging, and is intermittent, moderate .  Associated symptoms include: none.  The patient has tried "muscle rub", NSAIDs and acetaminophen for pain, with minimal relief.  Related to injury:   Yes.  Patient states that she works in [employer] and approximately 1 month ago  she bumped into the corner of the prep table at work hitting her right hip.  She can bear weight.n Aggravating factors: lifting, bending, twisting, turning.  Relieving factors: None  ESSENTIAL HYPERTENSION She endorses medication compliance however 2 of her antihypertensives have not been filled since September 2019. Will refill clonidine 0.7m BID and meotprolol 564mBID today. She recently had her losartan-hydrochlorothiazide 100-25 mg daily and amlodipine 10 mg daily filled 08-2018. BP Readings from Last 3 Encounters:  10/31/18 (!) 168/91  07/01/18 (!) 145/74  05/27/18 (!) 154/84    Hypothyroidism Taking Synthroid 50 mcg daily as prescribed.  She currently denies any hypo-or hyperthyroid symptoms.  TSH within normal limits today. Lab Results  Component Value Date   TSH 2.010 10/31/2018   Review of Systems  Constitutional: Negative for fever, malaise/fatigue and weight loss.  HENT: Negative.  Negative for nosebleeds.   Eyes: Negative.  Negative for blurred vision, double vision and photophobia.  Respiratory: Negative.  Negative for cough and shortness of breath.   Cardiovascular: Negative.  Negative for chest pain, palpitations and leg swelling.  Gastrointestinal: Positive for heartburn. Negative for nausea and vomiting.  Musculoskeletal: Positive for joint pain and myalgias.       SEE Sydney  Neurological: Negative.  Negative for dizziness, focal weakness, seizures and headaches.  Endo/Heme/Allergies: Positive for environmental allergies.  Psychiatric/Behavioral: Negative.  Negative for suicidal ideas.    Past Medical History:  Diagnosis Date  . Acid reflux   . Diabetes ([facility]  . History of anemia   . History of hiatal hernia   . Hypercholesterolemia   . Hypertension   . Jaw inflammation, right 01/30/2017    Past Surgical History:  Procedure Laterality Date  . COLONOSCOPY N/A 03/05/2018   Procedure: COLONOSCOPY;  Surgeon: FiDanie BinderMD;  [facility];   Service: Endoscopy;  Laterality: N/A;  12:00pm  . COLONOSCOPY N/A 03/06/2018   Procedure: COLONOSCOPY;  Surgeon: FiDanie BinderMD;  [facility];  Service: Endoscopy;  Laterality: N/A;  . DENTAL SURGERY    . ESOPHAGOGASTRODUODENOSCOPY  2011   white, raised, somewhat fixed plaques esophageal mucosa concerning for possible Candida esophagitis s/p brushing. Multiple gastric antral ulcerations, multiple small bulbar erosions and ulcerations.   . ESOPHAGOGASTRODUODENOSCOPY N/A 03/05/2018   Procedure: ESOPHAGOGASTRODUODENOSCOPY (EGD);  Surgeon: FiDanie BinderMD;  Location: [facility];  Service: Endoscopy;  Laterality: N/A;  . ESOPHAGOGASTRODUODENOSCOPY N/A 03/06/2018   Procedure: ESOPHAGOGASTRODUODENOSCOPY (EGD);  Surgeon: FiDanie BinderMD;  [facility];  Service: Endoscopy;  Laterality: N/A;  . TOOTH EXTRACTION N/A 02/01/2017   Procedure: FULL DENTAL EXTRACTIONS;  Surgeon: JeDiona BrownerDDS;  [facility] Service: Oral Surgery;  Laterality: N/A;  . TUBAL LIGATION      Family History  Problem Relation Age of Onset  . Breast  cancer Mother   . Colon cancer Neg Hx   . Colon polyps Neg Hx     Social History Reviewed with no changes to be made today.   Outpatient Medications Prior to Visit  Medication Sig Dispense Refill  . atorvastatin (LIPITOR) 40 MG tablet Take 1 tablet (40 mg total) by mouth daily. 30 tablet 2  . Blood Glucose Monitoring Suppl (TRUE METRIX METER) w/Device KIT Use as directed to check blood sugar at home. 1 kit 0  . levothyroxine (SYNTHROID, LEVOTHROID) 50 MCG tablet Take 1 tablet (50 mcg total) by mouth daily before breakfast. 30 tablet 3  . loratadine (CLARITIN) 10 MG tablet TAKE 1 TABLET BY MOUTH ONCE DAILY AS NEEDED FOR ALLERGIES 30 tablet 0  . metFORMIN (GLUCOPHAGE-XR) 500 MG 24 hr tablet Take 2 tablets (1,000 mg total) by mouth daily with breakfast. 60 tablet 2  . TRUEPLUS LANCETS 28G MISC Use as directed. 100 each 11  . amLODipine  (NORVASC) 10 MG tablet Take 1 tablet (10 mg total) by mouth daily. 30 tablet 2  . cloNIDine (CATAPRES) 0.1 MG tablet Take 1 tablet (0.1 mg total) by mouth 2 (two) times daily. 60 tablet 2  . fluticasone (FLONASE) 50 MCG/ACT nasal spray Place 1 spray into both nostrils daily as needed for allergies or rhinitis. (Patient taking differently: Place 1 spray into both nostrils 2 (two) times daily. ) 16 g 6  . glucose blood (TRUE METRIX BLOOD GLUCOSE TEST) test strip Use as instructed 100 each 12  . losartan-hydrochlorothiazide (HYZAAR) 100-25 MG tablet Take 1 tablet by mouth daily. 30 tablet 2  . metoprolol tartrate (LOPRESSOR) 50 MG tablet TAKE 1 TABLET BY MOUTH TWICE DAILY 60 tablet 2  . pantoprazole (PROTONIX) 20 MG tablet Take 1 tablet (20 mg total) by mouth 2 (two) times daily before a meal for 30 days. 60 tablet 3   No facility-administered medications prior to visit.     Allergies  Allergen Reactions  . Aspirin Other (See Comments)    Mouth, upper lip, nose area becomes numb  . Morphine And Related Other (See Comments)    "Did not feel good"  . Penicillins Other (See Comments)    Unknown Has patient had a PCN reaction causing immediate rash, facial/tongue/throat swelling, SOB or lightheadedness with hypotension: Unknown Has patient had a PCN reaction causing severe rash involving mucus membranes or skin necrosis: Unknown Has patient had a PCN reaction that required hospitalization: No Has patient had a PCN reaction occurring within the last 10 years: No If all of the above answers are "NO", then may proceed with Cephalosporin use.        Objective:    BP (!) 168/91 (BP Location: Left Arm, Patient Position: Sitting, Cuff Size: Normal)   Pulse 73   Temp 98.8 F (37.1 C) (Oral)   Ht 5' 7"  (1.702 m)   Wt 177 lb (80.3 kg)   LMP 08/13/2014 (Approximate)   SpO2 97%   BMI 27.72 kg/m  Wt Readings from Last 3 Encounters:  10/31/18 177 lb (80.3 kg)  07/21/18 174 lb (78.9 kg)   07/01/18 175 lb (79.4 kg)    Physical Exam Vitals signs and nursing note reviewed.  Constitutional:      Appearance: She is well-developed.  HENT:     Head: Normocephalic and atraumatic.     Nose:     Right Turbinates: Swollen and pale.     Left Turbinates: Swollen and pale.     Right Sinus: No  maxillary sinus tenderness or frontal sinus tenderness.     Left Sinus: No maxillary sinus tenderness or frontal sinus tenderness.  Neck:     Musculoskeletal: Normal range of motion.  Cardiovascular:     Rate and Rhythm: Normal rate and regular rhythm.     Heart sounds: Normal heart sounds. No murmur. No friction rub. No gallop.   Pulmonary:     Effort: Pulmonary effort is normal. No tachypnea or respiratory distress.     Breath sounds: Normal breath sounds. No decreased breath sounds, wheezing, rhonchi or rales.  Chest:     Chest wall: No tenderness.  Abdominal:     General: Bowel sounds are normal.     Palpations: Abdomen is soft.  Musculoskeletal: Normal range of motion.     Right hip: She exhibits tenderness. She exhibits normal range of motion, normal strength, no bony tenderness, no swelling, no crepitus, no deformity and no laceration.       Legs:  Skin:    General: Skin is warm and dry.  Neurological:     Mental Status: She is alert and oriented to person, place, and time.     Coordination: Coordination normal.  Psychiatric:        Behavior: Behavior normal. Behavior is cooperative.        Thought Content: Thought content normal.        Judgment: Judgment normal.        Patient has been counseled extensively about nutrition and exercise as well as the importance of adherence with medications and regular follow-up. The patient was given clear instructions to go to ER or return to medical center if symptoms don't improve, worsen or new problems develop. The patient verbalized understanding.   Follow-up: Return in about 3 weeks (around 11/21/2018) for right sided back pain.    Gildardo Pounds, FNP-BC [facility] [place], [place]   11/03/2018, 5:10 PM

## 2018-11-01 LAB — CMP14+EGFR
ALBUMIN: 4.4 g/dL (ref 3.8–4.9)
ALT: 51 IU/L — ABNORMAL HIGH (ref 0–32)
AST: 27 IU/L (ref 0–40)
Albumin/Globulin Ratio: 1.9 (ref 1.2–2.2)
Alkaline Phosphatase: 87 IU/L (ref 39–117)
BILIRUBIN TOTAL: 0.3 mg/dL (ref 0.0–1.2)
BUN / CREAT RATIO: 18 (ref 9–23)
BUN: 21 mg/dL (ref 6–24)
CALCIUM: 9.9 mg/dL (ref 8.7–10.2)
CO2: 21 mmol/L (ref 20–29)
CREATININE: 1.19 mg/dL — AB (ref 0.57–1.00)
Chloride: 101 mmol/L (ref 96–106)
GFR, EST AFRICAN AMERICAN: 58 mL/min/{1.73_m2} — AB (ref >59)
GFR, EST NON AFRICAN AMERICAN: 50 mL/min/{1.73_m2} — AB (ref >59)
GLUCOSE: 127 mg/dL — AB (ref 65–99)
Globulin, Total: 2.3 g/dL (ref 1.5–4.5)
Potassium: 4.9 mmol/L (ref 3.5–5.2)
Sodium: 139 mmol/L (ref 134–144)
TOTAL PROTEIN: 6.7 g/dL (ref 6.0–8.5)

## 2018-11-01 LAB — TSH: TSH: 2.01 u[IU]/mL (ref 0.450–4.500)

## 2018-11-01 LAB — CBC
HEMATOCRIT: 38.4 % (ref 34.0–46.6)
Hemoglobin: 13 g/dL (ref 11.1–15.9)
MCH: 31 pg (ref 26.6–33.0)
MCHC: 33.9 g/dL (ref 31.5–35.7)
MCV: 91 fL (ref 79–97)
Platelets: 230 10*3/uL (ref 150–450)
RBC: 4.2 x10E6/uL (ref 3.77–5.28)
RDW: 12.7 % (ref 11.7–15.4)
WBC: 6.3 10*3/uL (ref 3.4–10.8)

## 2018-11-01 LAB — HEPATITIS C ANTIBODY: Hep C Virus Ab: <0.1 s/co ratio (ref 0.0–0.9)

## 2018-11-01 LAB — VITAMIN D 25 HYDROXY (VIT D DEFICIENCY, FRACTURES): Vit D, 25-Hydroxy: 43.7 ng/mL (ref 30.0–100.0)

## 2018-11-03 ENCOUNTER — Telehealth: Payer: Self-pay | Admitting: Nurse Practitioner

## 2018-11-03 ENCOUNTER — Encounter: Payer: Self-pay | Admitting: Nurse Practitioner

## 2018-11-03 ENCOUNTER — Telehealth: Payer: Self-pay

## 2018-11-03 ENCOUNTER — Other Ambulatory Visit: Payer: Self-pay | Admitting: Nurse Practitioner

## 2018-11-03 MED ORDER — AMLODIPINE BESYLATE 10 MG PO TABS: 10.0000 mg | ORAL_TABLET | Freq: Every day | ORAL | 0 refills | Status: DC

## 2018-11-03 MED ORDER — METHOCARBAMOL 500 MG PO TABS: 500.0000 mg | ORAL_TABLET | Freq: Four times a day (QID) | ORAL | 1 refills | Status: AC

## 2018-11-03 MED ORDER — LOSARTAN POTASSIUM-HCTZ 100-25 MG PO TABS: 1.0000 | ORAL_TABLET | Freq: Every day | ORAL | 1 refills | Status: DC

## 2018-11-03 NOTE — Telephone Encounter (Signed)
Will route to PCP 

## 2018-11-03 NOTE — Telephone Encounter (Signed)
New Message   Pt states the Gabapentin is too expensive for her and the pt wants to know if a generic or alternative can be called in instead. Please f/u

## 2018-11-03 NOTE — Telephone Encounter (Signed)
CMA attempt to reach patient to inform on results.  No answer and left a VM.  

## 2018-11-03 NOTE — Telephone Encounter (Signed)
-----   Message from Gildardo Pounds, NP sent at 11/03/2018  9:56 AM EDT ----- Arterial study of legs was normal and did not show any blockages. Hep C negative. Thyroid level is normal as well as vitamin D. There is no anemia. Kidney function has improved. Will continue to monitor. Avoid NSAIDs such as ibuprofen, naproxen, motrin, aleve, advil.

## 2018-11-03 NOTE — Telephone Encounter (Signed)
Muscle relaxant Robaxin has been setting gabapentin has been discontinued

## 2018-11-04 ENCOUNTER — Other Ambulatory Visit: Payer: Self-pay

## 2018-11-04 DIAGNOSIS — R5382 Chronic fatigue, unspecified: Secondary | ICD-10-CM

## 2018-11-04 DIAGNOSIS — E1122 Type 2 diabetes mellitus with diabetic chronic kidney disease: Secondary | ICD-10-CM

## 2018-11-04 DIAGNOSIS — N183 Chronic kidney disease, stage 3 (moderate): Principal | ICD-10-CM

## 2018-11-04 DIAGNOSIS — E782 Mixed hyperlipidemia: Secondary | ICD-10-CM

## 2018-11-04 NOTE — Telephone Encounter (Signed)
CMA attempt to reach patient to inform new Rx. No answer and left a VM.

## 2018-11-11 ENCOUNTER — Ambulatory Visit (INDEPENDENT_AMBULATORY_CARE_PROVIDER_SITE_OTHER): Payer: Self-pay | Admitting: "Endocrinology

## 2018-11-11 ENCOUNTER — Encounter: Payer: Self-pay | Admitting: "Endocrinology

## 2018-11-11 DIAGNOSIS — E782 Mixed hyperlipidemia: Secondary | ICD-10-CM

## 2018-11-11 DIAGNOSIS — N183 Chronic kidney disease, stage 3 unspecified: Secondary | ICD-10-CM

## 2018-11-11 DIAGNOSIS — E039 Hypothyroidism, unspecified: Secondary | ICD-10-CM | POA: Insufficient documentation

## 2018-11-11 DIAGNOSIS — E1122 Type 2 diabetes mellitus with diabetic chronic kidney disease: Secondary | ICD-10-CM

## 2018-11-11 NOTE — Progress Notes (Addendum)
11/11/2018, 11:05 AM                                                    Endocrinology Telehealth Visit Follow up Note -During COVID -19 Pandemic  Subjective:    Patient ID: Sydney Morrow, female    DOB: 16-Apr-1961.  Sydney Morrow is engaged in telehealth visit with her verbal consent  for a follow-up in the management of her currently controlled type 2 diabetes, hypothyroidism, hyperlipidemia.    PCP: Gildardo Pounds, NP   Past Medical History:  Diagnosis Date  . Acid reflux   . Diabetes ([employer])   . History of anemia   . History of hiatal hernia   . Hypercholesterolemia   . Hypertension   . Jaw inflammation, right 01/30/2017   Past Surgical History:  Procedure Laterality Date  . COLONOSCOPY N/A 03/05/2018   Procedure: COLONOSCOPY;  Surgeon: Danie Binder, MD;  [facility];  Service: Endoscopy;  Laterality: N/A;  12:00pm  . COLONOSCOPY N/A 03/06/2018   Procedure: COLONOSCOPY;  Surgeon: Danie Binder, MD;  [facility];  Service: Endoscopy;  Laterality: N/A;  . DENTAL SURGERY    . ESOPHAGOGASTRODUODENOSCOPY  2011   white, raised, somewhat fixed plaques esophageal mucosa concerning for possible Candida esophagitis s/p brushing. Multiple gastric antral ulcerations, multiple small bulbar erosions and ulcerations.   . ESOPHAGOGASTRODUODENOSCOPY N/A 03/05/2018   Procedure: ESOPHAGOGASTRODUODENOSCOPY (EGD);  Surgeon: Danie Binder, MD;  [facility];  Service: Endoscopy;  Laterality: N/A;  . ESOPHAGOGASTRODUODENOSCOPY N/A 03/06/2018   Procedure: ESOPHAGOGASTRODUODENOSCOPY (EGD);  Surgeon: Danie Binder, MD;  [facility];  Service: Endoscopy;  Laterality: N/A;  . TOOTH EXTRACTION N/A 02/01/2017   Procedure: FULL DENTAL EXTRACTIONS;  Surgeon: Diona Browner, DDS;  Location: [place];  Service: Oral Surgery;  Laterality: N/A;  . TUBAL LIGATION     Social History    Socioeconomic History  . Marital status: Single    Spouse name: Not on file  . Number of children: Not on file  . Years of education: Not on file  . Highest education level: Not on file  Occupational History  . Not on file  Social Needs  . Financial resource strain: Not on file  . Food insecurity:    Worry: Not on file    Inability: Not on file  . Transportation needs:    Medical: Not on file    Non-medical: Not on file  Tobacco Use  . Smoking status: Current Every Day Smoker    Packs/day: 0.50    Years: 15.00    Pack years: 7.50    Types: Cigarettes  . Smokeless tobacco: Never Used  Substance and Sexual Activity  . Alcohol use: No  . Drug use: No  . Sexual activity: Not Currently  Lifestyle  . Physical activity:    Days per week: Not on file    Minutes per session: Not on file  . Stress: Not  on file  Relationships  . Social connections:    Talks on phone: Not on file    Gets together: Not on file    Attends religious service: Not on file    Active member of club or organization: Not on file    Attends meetings of clubs or organizations: Not on file    Relationship status: Not on file  Other Topics Concern  . Not on file  Social History Narrative  . Not on file   Outpatient Encounter Medications as of 11/11/2018  Medication Sig  . amLODipine (NORVASC) 10 MG tablet Take 1 tablet (10 mg total) by mouth daily.  Marland Kitchen atorvastatin (LIPITOR) 40 MG tablet Take 1 tablet (40 mg total) by mouth daily.  . Blood Glucose Monitoring Suppl (TRUE METRIX METER) w/Device KIT Use as directed to check blood sugar at home.  . cloNIDine (CATAPRES) 0.1 MG tablet Take 1 tablet (0.1 mg total) by mouth 2 (two) times daily.  . fluticasone (FLONASE) 50 MCG/ACT nasal spray Place 2 sprays into both nostrils daily for 30 days.  Marland Kitchen glucose blood (TRUE METRIX BLOOD GLUCOSE TEST) test strip Use as instructed  . levothyroxine (SYNTHROID, LEVOTHROID) 50 MCG tablet Take 1 tablet (50 mcg total) by  mouth daily before breakfast.  . loratadine (CLARITIN) 10 MG tablet TAKE 1 TABLET BY MOUTH ONCE DAILY AS NEEDED FOR ALLERGIES  . losartan-hydrochlorothiazide (HYZAAR) 100-25 MG tablet Take 1 tablet by mouth daily.  . metFORMIN (GLUCOPHAGE-XR) 500 MG 24 hr tablet Take 2 tablets (1,000 mg total) by mouth daily with breakfast.  . methocarbamol (ROBAXIN) 500 MG tablet Take 1 tablet (500 mg total) by mouth 4 (four) times daily for 14 days.  . metoprolol tartrate (LOPRESSOR) 50 MG tablet Take 1 tablet (50 mg total) by mouth 2 (two) times daily.  . pantoprazole (PROTONIX) 20 MG tablet Take 1 tablet (20 mg total) by mouth 2 (two) times daily before a meal for 30 days.  . TRUEPLUS LANCETS 28G MISC Use as directed.   No facility-administered encounter medications on file as of 11/11/2018.     ALLERGIES: Allergies  Allergen Reactions  . Aspirin Other (See Comments)    Mouth, upper lip, nose area becomes numb  . Morphine And Related Other (See Comments)    "Did not feel good"  . Penicillins Other (See Comments)    Unknown Has patient had a PCN reaction causing immediate rash, facial/tongue/throat swelling, SOB or lightheadedness with hypotension: Unknown Has patient had a PCN reaction causing severe rash involving mucus membranes or skin necrosis: Unknown Has patient had a PCN reaction that required hospitalization: No Has patient had a PCN reaction occurring within the last 10 years: No If all of the above answers are "NO", then may proceed with Cephalosporin use.     VACCINATION STATUS: Immunization History  Administered Date(s) Administered  . Influenza,inj,Quad PF,6+ Mos 09/10/2018  . Pneumococcal Polysaccharide-23 09/10/2018  . Tdap 09/10/2018    Diabetes  She presents for her follow-up diabetic visit. She has type 2 diabetes mellitus. Onset time: She reports that she was diagnosed at approximate age of 58 years. Her disease course has been worsening. There are no hypoglycemic  associated symptoms. Pertinent negatives for hypoglycemia include no confusion, headaches, pallor or seizures. Associated symptoms include blurred vision, fatigue, polydipsia and polyuria. Pertinent negatives for diabetes include no chest pain and no polyphagia. There are no hypoglycemic complications. Symptoms are improving. Diabetic complications include nephropathy. Risk factors for coronary artery disease include diabetes mellitus,  dyslipidemia, family history, hypertension, sedentary lifestyle and tobacco exposure. Current diabetic treatment includes oral agent (dual therapy) (She is currently on metformin 1000 mg p.o. twice daily, glimepiride 2 mg p.o. daily.). Her weight is fluctuating minimally. She is following a generally unhealthy diet. When asked about meal planning, she reported none. She has not had a previous visit with a dietitian. She rarely participates in exercise. (She was supposed to monitor blood glucose 4 times a day.  However, she brings in a meter showing only rare and random blood glucose monitoring, averaging between 100-150.  This is in sharp discrepancy from her recent A1c of 15.4%.   She also missed her lab appointment for repeat labs including A1c.   ) An ACE inhibitor/angiotensin II receptor blocker is being taken.  Hyperlipidemia  This is a chronic problem. The current episode started more than 1 year ago. Exacerbating diseases include diabetes. Pertinent negatives include no chest pain, myalgias or shortness of breath. Current antihyperlipidemic treatment includes statins. Risk factors for coronary artery disease include family history, dyslipidemia, diabetes mellitus, hypertension, a sedentary lifestyle and post-menopausal.  Hypertension  This is a chronic problem. The current episode started more than 1 year ago. The problem is uncontrolled. Associated symptoms include blurred vision. Pertinent negatives include no chest pain, headaches, palpitations or shortness of breath.  Past treatments include angiotensin blockers and central alpha agonists.     Objective:    LMP 08/13/2014 (Approximate)   Wt Readings from Last 3 Encounters:  10/31/18 177 lb (80.3 kg)  07/21/18 174 lb (78.9 kg)  07/01/18 175 lb (79.4 kg)     CMP ( most recent) CMP     Component Value Date/Time   NA 139 10/31/2018 1110   K 4.9 10/31/2018 1110   CL 101 10/31/2018 1110   CO2 21 10/31/2018 1110   GLUCOSE 127 (H) 10/31/2018 1110   GLUCOSE 110 (H) 07/04/2018 1007   BUN 21 10/31/2018 1110   CREATININE 1.19 (H) 10/31/2018 1110   CALCIUM 9.9 10/31/2018 1110   PROT 6.7 10/31/2018 1110   ALBUMIN 4.4 10/31/2018 1110   AST 27 10/31/2018 1110   ALT 51 (H) 10/31/2018 1110   ALKPHOS 87 10/31/2018 1110   BILITOT 0.3 10/31/2018 1110   GFRNONAA 50 (L) 10/31/2018 1110   GFRAA 58 (L) 10/31/2018 1110     Diabetic Labs (most recent): Lab Results  Component Value Date   HGBA1C 6.0 (H) 07/04/2018   HGBA1C 15.4 (H) 03/06/2018   HGBA1C >15.5 (H) 03/05/2018     Lipid Panel ( most recent) Lipid Panel     Component Value Date/Time   CHOL 206 (H) 07/04/2018 1007   CHOL 304 (H) 04/09/2017 1128   TRIG 101 07/04/2018 1007   HDL 50 07/04/2018 1007   HDL 51 04/09/2017 1128   CHOLHDL 4.1 07/04/2018 1007   VLDL 20 07/04/2018 1007   LDLCALC 136 (H) 07/04/2018 1007   LDLCALC 189 (H) 04/09/2017 1128       Assessment & Plan:   1. Type 2 diabetes mellitus with stage 3 chronic kidney disease, without long-term current use of insulin (HCC)  - Sydney Morrow has currently uncontrolled symptomatic type 2 DM since 58 years of age. -Her previsit labs are reviewed and showing significant improvement in her A1c to 6% from 15.4%.  Her renal function is improving as well.    -her diabetes is complicated by stage 3 renal insufficiency, sedentary life and she remains at extremely high risk for more acute and  chronic complications which include CAD, CVA, CKD, retinopathy, and neuropathy. These are all  discussed in detail with her.  - Patient admits there is a room for improvement in her diet and drink choices. -  Suggestion is made for her to avoid simple carbohydrates  from her diet including Cakes, Sweet Desserts / Pastries, Ice Cream, Soda (diet and regular), Sweet Tea, Candies, Chips, Cookies, Store Bought Juices, Alcohol in Excess of  1-2 drinks a day, Artificial Sweeteners, and "Sugar-free" Products. This will help patient to have stable blood glucose profile and potentially avoid unintended weight gain.  -Based on her previsit A1c of 6%, she will not require insulin treatment.  She will not require glimepiride either.  She is advised to continue Metformin extended release 500 mg p.o. twice daily daily at breakfast and at supper.   - she is encouraged to call clinic for blood glucose levels less than 70 or above 300 mg /dl.  2) Lipids/HPL:   Review of her recent lipid panel showed LDL uncontrolled  at 189.  she  is advised to continue lovastatin 20 mg p.o. nightly.  She will have fasting lipid panel on her subsequent visits.  Side effects and precautions discussed with her.  3) hypothyroidism: Her previsit thyroid function tests are consistent with appropriate replacement.  She is advised to continue levothyroxine 50 mcg p.o. every morning.  - We discussed about the correct intake of her thyroid hormone, on empty stomach at fasting, with water, separated by at least 30 minutes from breakfast and other medications,  and separated by more than 4 hours from calcium, iron, multivitamins, acid reflux medications (PPIs). -Patient is made aware of the fact that thyroid hormone replacement is needed for life, dose to be adjusted by periodic monitoring of thyroid function tests.   - I advised patient to maintain close follow up with Gildardo Pounds, NP for primary care needs.  - Patient Care Time Today:  25 min, of which >50% was spent in reviewing her  current and  previous labs/studies,  previous treatments, and medications doses and developing a plan for long-term care based on the latest recommendations for standards of care.  Sydney Morrow participated in the discussions, expressed understanding, and voiced agreement with the above plans.  All questions were answered to her satisfaction. she is encouraged to contact clinic should she have any questions or concerns prior to her return visit.  Follow up plan: - Return in about 4 months (around 03/13/2019) for Follow up with Pre-visit Labs.  Glade Lloyd, MD [facility] [address] [place], [place] [address] Phone: [phone]  Fax: [phone]    11/11/2018, 11:05 AM  This note was partially dictated with voice recognition software. Similar sounding words can be transcribed inadequately or may not  be corrected upon review.

## 2018-11-12 ENCOUNTER — Other Ambulatory Visit: Payer: Self-pay | Admitting: "Endocrinology

## 2018-11-12 ENCOUNTER — Other Ambulatory Visit: Payer: Self-pay | Admitting: Family Medicine

## 2018-11-22 NOTE — Telephone Encounter (Signed)
done

## 2018-11-26 ENCOUNTER — Encounter: Payer: Self-pay | Admitting: Nurse Practitioner

## 2018-11-26 ENCOUNTER — Other Ambulatory Visit: Payer: Self-pay

## 2018-11-26 ENCOUNTER — Ambulatory Visit: Payer: Self-pay | Attending: Nurse Practitioner | Admitting: Nurse Practitioner

## 2018-11-26 DIAGNOSIS — M5431 Sciatica, right side: Secondary | ICD-10-CM

## 2018-11-26 MED ORDER — METHOCARBAMOL 750 MG PO TABS: 750.0000 mg | ORAL_TABLET | Freq: Four times a day (QID) | ORAL | 0 refills | Status: DC

## 2018-11-26 NOTE — Progress Notes (Signed)
Assessment & Plan:  Sydney Morrow was seen today for follow-up.  Diagnoses and all orders for this visit:  Sciatica of right side -     methocarbamol (ROBAXIN) 750 MG tablet; Take 1 tablet (750 mg total) by mouth 4 (four) times daily for 30 days.    Patient has been counseled on age-appropriate routine health concerns for screening and prevention. These are reviewed and up-to-date. Referrals have been placed accordingly. Immunizations are up-to-date or declined.    Subjective:   Chief Complaint  Patient presents with   Follow-up    Pt. stated her right sided back pain is a little better but pain is still there.    HPI Sydney Morrow 58 y.o. female presents to office for telehealth follow-up.  She has a history of hyperlipidemia, hypertension and poorly controlled diabetes mellitus type 2 (currently seeing Dr. Dorris Morrow for diabetes co management).  Her follow-up appointment today is related to acute right hip sciatica.  Right Sided Sciatica Almost chronic at this point and onset of sciatica was over 2 months ago.  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  She is able to bear weight.  And describes pain as shooting, stabbing, and nagging.  She has tried over-the-counter muscle rubbing agents, NSAIDs and Tylenol for pain with minimal relief.  At her last appointment with me on 10/31/2018 prescribed gabapentin 100 mg 3 times daily however unfortunately she tells me today she was unable to pick up the gabapentin due to cost as she her work hours have been reduced.  She is currently taking methocarbamol which she reports provides moderate relief of her pain however pain is still occurring intermittently.  Aggravating factors: Lifting, bending, twisting, turning.  At this time we will continue methocarbamol. Patient has been advised to apply for financial assistance and schedule to see our financial  counselor.    Review of Systems  Constitutional: Negative for fever, malaise/fatigue and weight loss.  HENT: Negative.  Negative for nosebleeds.   Eyes: Negative.  Negative for blurred vision, double vision and photophobia.  Respiratory: Negative.  Negative for cough and shortness of breath.   Cardiovascular: Negative.  Negative for chest pain, palpitations and leg swelling.  Gastrointestinal: Negative.  Negative for heartburn, nausea and vomiting.  Musculoskeletal: Positive for myalgias.  Neurological: Positive for sensory change. Negative for dizziness, focal weakness, seizures, weakness and headaches.  Psychiatric/Behavioral: Negative.  Negative for suicidal ideas.    Past Medical History:  Diagnosis Date   Acid reflux    Diabetes (HCC)    History of anemia    History of hiatal hernia    Hypercholesterolemia    Hypertension    Jaw inflammation, right 01/30/2017    Past Surgical History:  Procedure Laterality Date   COLONOSCOPY N/A 03/05/2018   Procedure: COLONOSCOPY;  Surgeon: Danie Binder, MD;  [facility];  Service: Endoscopy;  Laterality: N/A;  12:00pm   COLONOSCOPY N/A 03/06/2018   Procedure: COLONOSCOPY;  Surgeon: Danie Binder, MD;  [facility];  Service: Endoscopy;  Laterality: N/A;   DENTAL SURGERY     ESOPHAGOGASTRODUODENOSCOPY  2011   white, raised, somewhat fixed plaques esophageal mucosa concerning for possible Candida esophagitis s/p brushing. Multiple gastric antral ulcerations, multiple small bulbar erosions and ulcerations.    ESOPHAGOGASTRODUODENOSCOPY N/A 03/05/2018   Procedure: ESOPHAGOGASTRODUODENOSCOPY (EGD);  Surgeon: Danie Binder, MD;  [facility];  Service: Endoscopy;  Laterality: N/A;   ESOPHAGOGASTRODUODENOSCOPY N/A 03/06/2018   Procedure: ESOPHAGOGASTRODUODENOSCOPY (EGD);  Surgeon: Danie Binder, MD;  [facility];  Service: Endoscopy;  Laterality: N/A;   TOOTH EXTRACTION N/A  02/01/2017   Procedure: FULL DENTAL EXTRACTIONS;  Surgeon: Diona Browner, DDS;  Location: [place];  Service: Oral Surgery;  Laterality: N/A;   TUBAL LIGATION      Family History  Problem Relation Age of Onset   Breast cancer Mother    Colon cancer Neg Hx    Colon polyps Neg Hx     Social History Reviewed with no changes to be made today.   Outpatient Medications Prior to Visit  Medication Sig Dispense Refill   amLODipine (NORVASC) 10 MG tablet Take 1 tablet (10 mg total) by mouth daily. 90 tablet 0   atorvastatin (LIPITOR) 40 MG tablet Take 1 tablet by mouth once daily 90 tablet 0   Blood Glucose Monitoring Suppl (TRUE METRIX METER) w/Device KIT Use as directed to check blood sugar at home. 1 kit 0   cloNIDine (CATAPRES) 0.1 MG tablet Take 1 tablet (0.1 mg total) by mouth 2 (two) times daily. 180 tablet 1   fluticasone (FLONASE) 50 MCG/ACT nasal spray Place 2 sprays into both nostrils daily for 30 days. 16 g 1   glucose blood (TRUE METRIX BLOOD GLUCOSE TEST) test strip Use as instructed 100 each 12   levothyroxine (SYNTHROID, LEVOTHROID) 50 MCG tablet TAKE 1 TABLET BY MOUTH ONCE DAILY BEFORE BREAKFAST 90 tablet 0   loratadine (CLARITIN) 10 MG tablet TAKE 1 TABLET BY MOUTH ONCE DAILY AS NEEDED FOR ALLERGIES 30 tablet 0   losartan-hydrochlorothiazide (HYZAAR) 100-25 MG tablet Take 1 tablet by mouth daily. 90 tablet 1   metFORMIN (GLUCOPHAGE-XR) 500 MG 24 hr tablet Take 2 tablets (1,000 mg total) by mouth daily with breakfast. 60 tablet 2   metoprolol tartrate (LOPRESSOR) 50 MG tablet Take 1 tablet (50 mg total) by mouth 2 (two) times daily. 180 tablet 1   pantoprazole (PROTONIX) 20 MG tablet Take 1 tablet (20 mg total) by mouth 2 (two) times daily before a meal for 30 days. 60 tablet 3   TRUEPLUS LANCETS 28G MISC Use as directed. 100 each 11   No facility-administered medications prior to visit.     Allergies  Allergen Reactions   Aspirin Other (See Comments)     Mouth, upper lip, nose area becomes numb   Morphine And Related Other (See Comments)    "Did not feel good"   Penicillins Other (See Comments)    Unknown Has patient had a PCN reaction causing immediate rash, facial/tongue/throat swelling, SOB or lightheadedness with hypotension: Unknown Has patient had a PCN reaction causing severe rash involving mucus membranes or skin necrosis: Unknown Has patient had a PCN reaction that required hospitalization: No Has patient had a PCN reaction occurring within the last 10 years: No If all of the above answers are "NO", then may proceed with Cephalosporin use.        Objective:    LMP 08/13/2014 (Approximate)  Wt Readings from Last 3 Encounters:  10/31/18 177 lb (80.3 kg)  07/21/18 174 lb (78.9 kg)  07/01/18 175 lb (79.4 kg)        Patient has been counseled extensively about nutrition and exercise as well as the importance of adherence with medications and regular follow-up. The patient was given clear instructions to go to ER or return to medical center if  symptoms don't improve, worsen or new problems develop. The patient verbalized understanding.   Follow-up: Return in about 2 months (around 01/26/2019).   Gildardo Pounds, FNP-BC [facility] [place], [place]   11/26/2018, 10:17 AM

## 2018-12-18 ENCOUNTER — Other Ambulatory Visit: Payer: Self-pay

## 2018-12-18 ENCOUNTER — Ambulatory Visit: Payer: Self-pay | Admitting: Gastroenterology

## 2018-12-18 ENCOUNTER — Encounter: Payer: Self-pay | Admitting: Gastroenterology

## 2018-12-18 DIAGNOSIS — K279 Peptic ulcer, site unspecified, unspecified as acute or chronic, without hemorrhage or perforation: Secondary | ICD-10-CM

## 2018-12-18 DIAGNOSIS — K219 Gastro-esophageal reflux disease without esophagitis: Secondary | ICD-10-CM

## 2018-12-18 MED ORDER — PANTOPRAZOLE SODIUM 20 MG PO TBEC: 20.0000 mg | DELAYED_RELEASE_TABLET | Freq: Two times a day (BID) | ORAL | 3 refills | Status: DC

## 2018-12-18 NOTE — Progress Notes (Signed)
 Subjective:    Patient ID: Sydney Morrow, female    DOB: 06/07/1961, 58 y.o.   MRN: [id number]   Primary Care Physician:  Fleming, Zelda W, NP  Primary GI:  Sandi Fields, MD   Patient Location: home   Provider Location: [facility] office   Reason for Visit: PUD, GASTRITIS, GERD   Persons present on the virtual encounter, with roles: patient, myself (provider), MARTINA BOOTH CMA (update meds/allergies)   Total time (minutes) spent on medical discussion: 10 MINUTES   Due to COVID-19, visit was VIA TELEPHONE VISIT DUE TO COVID 19. VISIT IS CONDUCTED VIRTUALLY AND WAS REQUESTED BY PATIENT.   Virtual Visit via TELEPHONE   I connected with Sydney Morrow and verified that I am speaking with the correct person using two identifiers.   I discussed the limitations, risks, security and privacy concerns of performing an evaluation and management service by telephone/video and the availability of in person appointments. I also discussed with the patient that there may be a patient responsible charge related to this service. The patient expressed understanding and agreed to proceed.  HPI NO ASPIRIN, BC/GOODY POWDERS, IBUPROFEN/MOTRIN, OR NAPROXEN/ALEVE. JUST TYLENOL.  CHANGED FROM OMEPRAZOLE TO PANTOPRAZOLE AND HEARTBURN IS CONTROLLED. BMs: Q1-2 DAYS.  PT DENIES FEVER, CHILLS, HEMATOCHEZIA, HEMATEMESIS, nausea, vomiting, melena, diarrhea, CHEST PAIN, SHORTNESS OF BREATH,  CHANGE IN BOWEL IN HABITS, constipation, abdominal pain, problems swallowing, OR heartburn or indigestion.  Past Medical History:  Diagnosis Date  . Acid reflux   . Diabetes (HCC)   . History of anemia   . History of hiatal hernia   . Hypercholesterolemia   . Hypertension   . Jaw inflammation, right 01/30/2017   Past Surgical History:  Procedure Laterality Date  . COLONOSCOPY N/A 03/05/2018   Procedure: COLONOSCOPY;  Surgeon: Fields, Sandi L, MD;  [facility]: [facility];  Service: Endoscopy;  Laterality: N/A;  12:00pm   . COLONOSCOPY N/A 03/06/2018   Procedure: COLONOSCOPY;  Surgeon: Fields, Sandi L, MD;  [facility];  Service: Endoscopy;  Laterality: N/A;  . DENTAL SURGERY    . ESOPHAGOGASTRODUODENOSCOPY  2011   white, raised, somewhat fixed plaques esophageal mucosa concerning for possible Candida esophagitis s/p brushing. Multiple gastric antral ulcerations, multiple small bulbar erosions and ulcerations.   . ESOPHAGOGASTRODUODENOSCOPY N/A 03/05/2018   Procedure: ESOPHAGOGASTRODUODENOSCOPY (EGD);  Surgeon: Fields, Sandi L, MD;  [facility];  Service: Endoscopy;  Laterality: N/A;  . ESOPHAGOGASTRODUODENOSCOPY N/A 03/06/2018   Procedure: ESOPHAGOGASTRODUODENOSCOPY (EGD);  Surgeon: Fields, Sandi L, MD;  [facility];  Service: Endoscopy;  Laterality: N/A;  . TOOTH EXTRACTION N/A 02/01/2017   Procedure: FULL DENTAL EXTRACTIONS;  Surgeon: Jensen, Scott, DDS;  Location: [facility];  Service: Oral Surgery;  Laterality: N/A;  . TUBAL LIGATION     Allergies  Allergen Reactions  . Aspirin Other (See Comments)    Mouth, upper lip, nose area becomes numb  . Morphine And Related Other (See Comments)    "Did not feel good"  . Penicillins Other (See Comments)       Current Outpatient Medications  Medication Sig    . amLODipine (NORVASC) 10 MG tablet Take 1 tablet (10 mg total) by mouth daily.    . atorvastatin (LIPITOR) 40 MG tablet Take 1 tablet by mouth once daily    . Blood Glucose Monitoring Suppl (TRUE METRIX METER) w/Device KIT Use as directed to check blood sugar at home.    . VITAMIN D 5000 PO) Take by mouth   daily.    . cloNIDine (CATAPRES) 0.1 MG tablet Take 1 tablet (0.1 mg total) BID.    . Coenzyme Q10 (CO Q 10 PO) Take by mouth daily.    . FLONASE) 50 MCG/ACT nasal spray Place 2 sprays into both nostrils daily for 30 days.    . glucose blood (TRUE METRIX BLOOD GLUCOSE TEST) test strip Use as instructed    . levothyroxine (SYNTHROID, LEVOTHROID) 50 MCG tablet TAKE 1 TABLET  BY MOUTH ONCE DAILY BEFORE BREAKFAST    . loratadine (CLARITIN) 10 MG tablet TAKE 1 TABLET BY MOUTH ONCE DAILY AS NEEDED FOR ALLERGIES    . losartan-hydrochlorothiazide (HYZAAR) 100-25 MG tablet Take 1 tablet by mouth daily.    . metFORMIN (GLUCOPHAGE-XR) 500 MG 24 hr tablet Take 2 tablets (1,000 mg total) by mouth daily with breakfast.    . methocarbamol (ROBAXIN) 750 MG tablet Take 1 tablet (750 mg total) by mouth 4 (four) times daily for 30 days. (Patient taking differently: Take 750 mg by mouth as needed. )    . metoprolol tartrate (LOPRESSOR) 50 MG tablet Take 1 tablet (50 mg total) by mouth 2 (two) times daily.    . Omega-3 Fatty Acids (FISH OIL PO) Take by mouth 2 (two) times a day.    . pantoprazole (PROTONIX) 20 MG tablet Take 1 tablet (20 mg total) by mouth 2 (two) times daily before a meal for 30 days.    . TRUEPLUS LANCETS 28G MISC Use as directed.     Review of Systems PER HPI OTHERWISE ALL SYSTEMS ARE NEGATIVE.    Objective:   Physical Exam  TELEPHONE VISIT DUE TO COVID 19, VISIT IS CONDUCTED VIRTUALLY AND WAS REQUESTED BY PATIENT.       Assessment & Plan:   

## 2018-12-18 NOTE — Progress Notes (Signed)
CC'D TO PCP °

## 2018-12-18 NOTE — Assessment & Plan Note (Signed)
SYMPTOMS CONTROLLED/RESOLVED. USING TYLENOL NOT ASA/NSAIDS.  CONTINUE TO MONITOR SYMPTOMS. CONTINUE PROTONIX. TAKE 30 MINUTES PRIOR TO MEALS TWICE DAILY. FOLLOW UP IN 1 YEAR.

## 2018-12-18 NOTE — Assessment & Plan Note (Signed)
SYMPTOMS FAIRLY WELL CONTROLLED ON PROTONIX 20 MG BID.  CONTINUE PROTONIX. TAKE 30 MINUTES PRIOR TO MEALS TWICE DAILY. CONTINUE TO MONITOR SYMPTOMS. FOLLOW UP IN  1 YEAR.

## 2018-12-22 NOTE — Progress Notes (Signed)
CC'D TO PCP °

## 2019-01-27 ENCOUNTER — Encounter: Payer: Self-pay | Admitting: Nurse Practitioner

## 2019-01-27 ENCOUNTER — Ambulatory Visit: Payer: Self-pay | Attending: Nurse Practitioner | Admitting: Nurse Practitioner

## 2019-01-27 ENCOUNTER — Other Ambulatory Visit: Payer: Self-pay

## 2019-01-27 VITALS — BP 124/86 | HR 63 | Temp 98.6°F | Resp 16 | Wt 174.6 lb

## 2019-01-27 DIAGNOSIS — E782 Mixed hyperlipidemia: Secondary | ICD-10-CM

## 2019-01-27 DIAGNOSIS — I10 Essential (primary) hypertension: Secondary | ICD-10-CM

## 2019-01-27 MED ORDER — AMLODIPINE BESYLATE 10 MG PO TABS: 10.0000 mg | ORAL_TABLET | Freq: Every day | ORAL | 0 refills | Status: DC

## 2019-01-27 MED ORDER — ATORVASTATIN CALCIUM 40 MG PO TABS: 40.0000 mg | ORAL_TABLET | Freq: Every day | ORAL | 0 refills | Status: DC

## 2019-01-27 NOTE — Progress Notes (Signed)
Assessment & Plan:  Sydney Morrow was seen today for hypertension.  Diagnoses and all orders for this visit:  Essential hypertension -     amLODipine (NORVASC) 10 MG tablet; Take 1 tablet (10 mg total) by mouth daily. Continue all antihypertensives as prescribed.  Remember to bring in your blood pressure log with you for your follow up appointment.  DASH/Mediterranean Diets are healthier choices for HTN.    Mixed hyperlipidemia -     atorvastatin (LIPITOR) 40 MG tablet; Take 1 tablet (40 mg total) by mouth daily. INSTRUCTIONS: Work on a low fat, heart healthy diet and participate in regular aerobic exercise program by working out at least 150 minutes per week; 5 days a week-30 minutes per day. Avoid red meat, fried foods. junk foods, sodas, sugary drinks, unhealthy snacking, alcohol and smoking.  Drink at least 48oz of water per day and monitor your carbohydrate intake daily.     Patient has been counseled on age-appropriate routine health concerns for screening and prevention. These are reviewed and up-to-date. Referrals have been placed accordingly. Immunizations are up-to-date or declined.    Subjective:   Chief Complaint  Patient presents with  . Hypertension   HPI Sydney Morrow 58 y.o. female presents to office today for follow up to HTN and hyperlipidemia    Essential Hypertension Blood pressure is well controlled. She is compliant with taking her medications which include: Amlodipine 10 mg daily, clonidine 0.1 mg BID, Hyzaar 100-25 mg daily and metoprolol 50 mg BID. Denies chest pain, shortness of breath, palpitations, lightheadedness, dizziness, headaches or BLE edema. She does not monitor her blood pressure at home.  BP Readings from Last 3 Encounters:  01/27/19 124/86  10/31/18 (!) 168/91  07/01/18 (!) 145/74   Hyperlipidemia Patient presents for follow up to hyperlipidemia.  She is medication compliant taking atorvastatin 40 mg daily. She is not diet compliant and  denies poor exercise tolerance and skin xanthelasma or statin intolerance.  Lab Results  Component Value Date   CHOL 206 (H) 07/04/2018   Lab Results  Component Value Date   HDL 50 07/04/2018   Lab Results  Component Value Date   LDLCALC 136 (H) 07/04/2018   Lab Results  Component Value Date   TRIG 101 07/04/2018   Lab Results  Component Value Date   CHOLHDL 4.1 07/04/2018     Review of Systems  Constitutional: Negative for fever, malaise/fatigue and weight loss.  HENT: Negative.  Negative for nosebleeds.   Eyes: Negative.  Negative for blurred vision, double vision and photophobia.  Respiratory: Negative.  Negative for cough and shortness of breath.   Cardiovascular: Negative.  Negative for chest pain, palpitations and leg swelling.  Gastrointestinal: Negative.  Negative for heartburn, nausea and vomiting.  Musculoskeletal: Negative.  Negative for myalgias.  Neurological: Negative.  Negative for dizziness, focal weakness, seizures and headaches.  Psychiatric/Behavioral: Negative.  Negative for suicidal ideas.    Past Medical History:  Diagnosis Date  . Acid reflux   . Diabetes ([place])   . History of anemia   . History of hiatal hernia   . Hypercholesterolemia   . Hypertension   . Jaw inflammation, right 01/30/2017    Past Surgical History:  Procedure Laterality Date  . COLONOSCOPY N/A 03/05/2018   Procedure: COLONOSCOPY;  Surgeon: Danie Binder, MD;  [facility];  Service: Endoscopy;  Laterality: N/A;  12:00pm  . COLONOSCOPY N/A 03/06/2018   Procedure: COLONOSCOPY;  Surgeon: Danie Binder, MD;  Location:  [facility];  Service: Endoscopy;  Laterality: N/A;  . DENTAL SURGERY    . ESOPHAGOGASTRODUODENOSCOPY  2011   white, raised, somewhat fixed plaques esophageal mucosa concerning for possible Candida esophagitis s/p brushing. Multiple gastric antral ulcerations, multiple small bulbar erosions and ulcerations.   . ESOPHAGOGASTRODUODENOSCOPY N/A  03/05/2018   Procedure: ESOPHAGOGASTRODUODENOSCOPY (EGD);  Surgeon: Danie Binder, MD;  [facility];  Service: Endoscopy;  Laterality: N/A;  . ESOPHAGOGASTRODUODENOSCOPY N/A 03/06/2018   Procedure: ESOPHAGOGASTRODUODENOSCOPY (EGD);  Surgeon: Danie Binder, MD;  [facility];  Service: Endoscopy;  Laterality: N/A;  . TOOTH EXTRACTION N/A 02/01/2017   Procedure: FULL DENTAL EXTRACTIONS;  Surgeon: Diona Browner, DDS;  Location: [facility];  Service: Oral Surgery;  Laterality: N/A;  . TUBAL LIGATION      Family History  Problem Relation Age of Onset  . Breast cancer Mother   . Colon cancer Neg Hx   . Colon polyps Neg Hx     Social History Reviewed with no changes to be made today.   Outpatient Medications Prior to Visit  Medication Sig Dispense Refill  . Blood Glucose Monitoring Suppl (TRUE METRIX METER) w/Device KIT Use as directed to check blood sugar at home. 1 kit 0  . Cholecalciferol (DIALYVITE VITAMIN D 5000 PO) Take by mouth daily.    . cloNIDine (CATAPRES) 0.1 MG tablet Take 1 tablet (0.1 mg total) by mouth 2 (two) times daily. 180 tablet 1  . Coenzyme Q10 (CO Q 10 PO) Take by mouth daily.    . fluticasone (FLONASE) 50 MCG/ACT nasal spray Place 2 sprays into both nostrils daily for 30 days. 16 g 1  . glucose blood (TRUE METRIX BLOOD GLUCOSE TEST) test strip Use as instructed 100 each 12  . levothyroxine (SYNTHROID, LEVOTHROID) 50 MCG tablet TAKE 1 TABLET BY MOUTH ONCE DAILY BEFORE BREAKFAST 90 tablet 0  . loratadine (CLARITIN) 10 MG tablet TAKE 1 TABLET BY MOUTH ONCE DAILY AS NEEDED FOR ALLERGIES 30 tablet 0  . losartan-hydrochlorothiazide (HYZAAR) 100-25 MG tablet Take 1 tablet by mouth daily. 90 tablet 1  . metFORMIN (GLUCOPHAGE-XR) 500 MG 24 hr tablet Take 2 tablets (1,000 mg total) by mouth daily with breakfast. 60 tablet 2  . metoprolol tartrate (LOPRESSOR) 50 MG tablet Take 1 tablet (50 mg total) by mouth 2 (two) times daily. 180 tablet 1  . Omega-3 Fatty  Acids (FISH OIL PO) Take by mouth 2 (two) times a day.    . pantoprazole (PROTONIX) 20 MG tablet Take 1 tablet (20 mg total) by mouth 2 (two) times daily before a meal for 30 days. 180 tablet 3  . TRUEPLUS LANCETS 28G MISC Use as directed. 100 each 11  . amLODipine (NORVASC) 10 MG tablet Take 1 tablet (10 mg total) by mouth daily. 90 tablet 0  . atorvastatin (LIPITOR) 40 MG tablet Take 1 tablet by mouth once daily 90 tablet 0   No facility-administered medications prior to visit.     Allergies  Allergen Reactions  . Aspirin Other (See Comments)    Mouth, upper lip, nose area becomes numb  . Morphine And Related Other (See Comments)    "Did not feel good"  . Penicillins Other (See Comments)    Unknown Has patient had a PCN reaction causing immediate rash, facial/tongue/throat swelling, SOB or lightheadedness with hypotension: Unknown Has patient had a PCN reaction causing severe rash involving mucus membranes or skin necrosis: Unknown Has patient had a PCN reaction that required hospitalization:  No Has patient had a PCN reaction occurring within the last 10 years: No If all of the above answers are "NO", then may proceed with Cephalosporin use.        Objective:    BP 124/86   Pulse 63   Temp 98.6 F (37 C) (Oral)   Resp 16   Wt 174 lb 9.6 oz (79.2 kg)   LMP 08/13/2014 (Approximate)   SpO2 97%   BMI 27.35 kg/m  Wt Readings from Last 3 Encounters:  01/27/19 174 lb 9.6 oz (79.2 kg)  10/31/18 177 lb (80.3 kg)  07/21/18 174 lb (78.9 kg)    Physical Exam Vitals signs and nursing note reviewed.  Constitutional:      Appearance: She is well-developed.  HENT:     Head: Normocephalic and atraumatic.  Eyes:     Comments: Wearing glasses  Neck:     Musculoskeletal: Normal range of motion.  Cardiovascular:     Rate and Rhythm: Normal rate and regular rhythm.     Heart sounds: Normal heart sounds. No murmur. No friction rub. No gallop.   Pulmonary:     Effort: Pulmonary  effort is normal. No tachypnea or respiratory distress.     Breath sounds: Normal breath sounds. No decreased breath sounds, wheezing, rhonchi or rales.  Chest:     Chest wall: No tenderness.  Abdominal:     General: Bowel sounds are normal.     Palpations: Abdomen is soft.  Musculoskeletal: Normal range of motion.  Skin:    General: Skin is warm and dry.  Neurological:     Mental Status: She is alert and oriented to person, place, and time.     Coordination: Coordination normal.  Psychiatric:        Behavior: Behavior normal. Behavior is cooperative.        Thought Content: Thought content normal.        Judgment: Judgment normal.        Patient has been counseled extensively about nutrition and exercise as well as the importance of adherence with medications and regular follow-up. The patient was given clear instructions to go to ER or return to medical center if symptoms don't improve, worsen or new problems develop. The patient verbalized understanding.   Follow-up: Return in about 8 weeks (around 03/25/2019) for HTN.   Gildardo Pounds, FNP-BC [facility] and [place], [place]   01/27/2019, 2:30 PM

## 2019-01-27 NOTE — Patient Instructions (Signed)
DASH Eating Plan DASH stands for "Dietary Approaches to Stop Hypertension." The DASH eating plan is a healthy eating plan that has been shown to reduce high blood pressure (hypertension). It may also reduce your risk for type 2 diabetes, heart disease, and stroke. The DASH eating plan may also help with weight loss. What are tips for following this plan?  General guidelines  Avoid eating more than 2,300 mg (milligrams) of salt (sodium) a day. If you have hypertension, you may need to reduce your sodium intake to 1,500 mg a day.  Limit alcohol intake to no more than 1 drink a day for nonpregnant women and 2 drinks a day for men. One drink equals 12 oz of beer, 5 oz of wine, or 1 oz of hard liquor.  Work with your health care provider to maintain a healthy body weight or to lose weight. Ask what an ideal weight is for you.  Get at least 30 minutes of exercise that causes your heart to beat faster (aerobic exercise) most days of the week. Activities may include walking, swimming, or biking.  Work with your health care provider or diet and nutrition specialist (dietitian) to adjust your eating plan to your individual calorie needs. Reading food labels   Check food labels for the amount of sodium per serving. Choose foods with less than 5 percent of the Daily Value of sodium. Generally, foods with less than 300 mg of sodium per serving fit into this eating plan.  To find whole grains, look for the word "whole" as the first word in the ingredient list. Shopping  Buy products labeled as "low-sodium" or "no salt added."  Buy fresh foods. Avoid canned foods and premade or frozen meals. Cooking  Avoid adding salt when cooking. Use salt-free seasonings or herbs instead of table salt or sea salt. Check with your health care provider or pharmacist before using salt substitutes.  Do not fry foods. Cook foods using healthy methods such as baking, boiling, grilling, and broiling instead.  Cook with  heart-healthy oils, such as olive, canola, soybean, or sunflower oil. Meal planning  Eat a balanced diet that includes: ? 5 or more servings of fruits and vegetables each day. At each meal, try to fill half of your plate with fruits and vegetables. ? Up to 6-8 servings of whole grains each day. ? Less than 6 oz of lean meat, poultry, or fish each day. A 3-oz serving of meat is about the same size as a deck of cards. One egg equals 1 oz. ? 2 servings of low-fat dairy each day. ? A serving of nuts, seeds, or beans 5 times each week. ? Heart-healthy fats. Healthy fats called Omega-3 fatty acids are found in foods such as flaxseeds and coldwater fish, like sardines, salmon, and mackerel.  Limit how much you eat of the following: ? Canned or prepackaged foods. ? Food that is high in trans fat, such as fried foods. ? Food that is high in saturated fat, such as fatty meat. ? Sweets, desserts, sugary drinks, and other foods with added sugar. ? Full-fat dairy products.  Do not salt foods before eating.  Try to eat at least 2 vegetarian meals each week.  Eat more home-cooked food and less restaurant, buffet, and fast food.  When eating at a restaurant, ask that your food be prepared with less salt or no salt, if possible. What foods are recommended? The items listed may not be a complete list. Talk with your dietitian about   what dietary choices are best for you. Grains Whole-grain or whole-wheat bread. Whole-grain or whole-wheat pasta. Brown rice. Oatmeal. Quinoa. Bulgur. Whole-grain and low-sodium cereals. Pita bread. Low-fat, low-sodium crackers. Whole-wheat flour tortillas. Vegetables Fresh or frozen vegetables (raw, steamed, roasted, or grilled). Low-sodium or reduced-sodium tomato and vegetable juice. Low-sodium or reduced-sodium tomato sauce and tomato paste. Low-sodium or reduced-sodium canned vegetables. Fruits All fresh, dried, or frozen fruit. Canned fruit in natural juice (without  added sugar). Meat and other protein foods Skinless chicken or turkey. Ground chicken or turkey. Pork with fat trimmed off. Fish and seafood. Egg whites. Dried beans, peas, or lentils. Unsalted nuts, nut butters, and seeds. Unsalted canned beans. Lean cuts of beef with fat trimmed off. Low-sodium, lean deli meat. Dairy Low-fat (1%) or fat-free (skim) milk. Fat-free, low-fat, or reduced-fat cheeses. Nonfat, low-sodium ricotta or cottage cheese. Low-fat or nonfat yogurt. Low-fat, low-sodium cheese. Fats and oils Soft margarine without trans fats. Vegetable oil. Low-fat, reduced-fat, or light mayonnaise and salad dressings (reduced-sodium). Canola, safflower, olive, soybean, and sunflower oils. Avocado. Seasoning and other foods Herbs. Spices. Seasoning mixes without salt. Unsalted popcorn and pretzels. Fat-free sweets. What foods are not recommended? The items listed may not be a complete list. Talk with your dietitian about what dietary choices are best for you. Grains Baked goods made with fat, such as croissants, muffins, or some breads. Dry pasta or rice meal packs. Vegetables Creamed or fried vegetables. Vegetables in a cheese sauce. Regular canned vegetables (not low-sodium or reduced-sodium). Regular canned tomato sauce and paste (not low-sodium or reduced-sodium). Regular tomato and vegetable juice (not low-sodium or reduced-sodium). Pickles. Olives. Fruits Canned fruit in a light or heavy syrup. Fried fruit. Fruit in cream or butter sauce. Meat and other protein foods Fatty cuts of meat. Ribs. Fried meat. Bacon. Sausage. Bologna and other processed lunch meats. Salami. Fatback. Hotdogs. Bratwurst. Salted nuts and seeds. Canned beans with added salt. Canned or smoked fish. Whole eggs or egg yolks. Chicken or turkey with skin. Dairy Whole or 2% milk, cream, and half-and-half. Whole or full-fat cream cheese. Whole-fat or sweetened yogurt. Full-fat cheese. Nondairy creamers. Whipped toppings.  Processed cheese and cheese spreads. Fats and oils Butter. Stick margarine. Lard. Shortening. Ghee. Bacon fat. Tropical oils, such as coconut, palm kernel, or palm oil. Seasoning and other foods Salted popcorn and pretzels. Onion salt, garlic salt, seasoned salt, table salt, and sea salt. Worcestershire sauce. Tartar sauce. Barbecue sauce. Teriyaki sauce. Soy sauce, including reduced-sodium. Steak sauce. Canned and packaged gravies. Fish sauce. Oyster sauce. Cocktail sauce. Horseradish that you find on the shelf. Ketchup. Mustard. Meat flavorings and tenderizers. Bouillon cubes. Hot sauce and Tabasco sauce. Premade or packaged marinades. Premade or packaged taco seasonings. Relishes. Regular salad dressings. Where to find more information:  National Heart, Lung, and Blood Institute: www.nhlbi.nih.gov  American Heart Association: www.heart.org Summary  The DASH eating plan is a healthy eating plan that has been shown to reduce high blood pressure (hypertension). It may also reduce your risk for type 2 diabetes, heart disease, and stroke.  With the DASH eating plan, you should limit salt (sodium) intake to 2,300 mg a day. If you have hypertension, you may need to reduce your sodium intake to 1,500 mg a day.  When on the DASH eating plan, aim to eat more fresh fruits and vegetables, whole grains, lean proteins, low-fat dairy, and heart-healthy fats.  Work with your health care provider or diet and nutrition specialist (dietitian) to adjust your eating plan to your   individual calorie needs. This information is not intended to replace advice given to you by your health care provider. Make sure you discuss any questions you have with your health care provider. Document Released: 07/19/2011 Document Revised: 07/23/2016 Document Reviewed: 07/23/2016 Elsevier Interactive Patient Education  2019 [employer].  Managing Your Hypertension Hypertension is commonly called high blood pressure. This is when  the force of your blood pressing against the walls of your arteries is too strong. Arteries are blood vessels that carry blood from your heart throughout your body. Hypertension forces the heart to work harder to pump blood, and may cause the arteries to become narrow or stiff. Having untreated or uncontrolled hypertension can cause heart attack, stroke, kidney disease, and other problems. What are blood pressure readings? A blood pressure reading consists of a higher number over a lower number. Ideally, your blood pressure should be below 120/80. The first ("top") number is called the systolic pressure. It is a measure of the pressure in your arteries as your heart beats. The second ("bottom") number is called the diastolic pressure. It is a measure of the pressure in your arteries as the heart relaxes. What does my blood pressure reading mean? Blood pressure is classified into four stages. Based on your blood pressure reading, your health care provider may use the following stages to determine what type of treatment you need, if any. Systolic pressure and diastolic pressure are measured in a unit called mm Hg. Normal  Systolic pressure: below 376.  Diastolic pressure: below 80. Elevated  Systolic pressure: 283-151.  Diastolic pressure: below 80. Hypertension stage 1  Systolic pressure: 761-607.  Diastolic pressure: 37-10. Hypertension stage 2  Systolic pressure: 626 or above.  Diastolic pressure: 90 or above. What health risks are associated with hypertension? Managing your hypertension is an important responsibility. Uncontrolled hypertension can lead to:  A heart attack.  A stroke.  A weakened blood vessel (aneurysm).  Heart failure.  Kidney damage.  Eye damage.  Metabolic syndrome.  Memory and concentration problems. What changes can I make to manage my hypertension? Hypertension can be managed by making lifestyle changes and possibly by taking medicines. Your health  care provider will help you make a plan to bring your blood pressure within a normal range. Eating and drinking   Eat a diet that is high in fiber and potassium, and low in salt (sodium), added sugar, and fat. An example eating plan is called the DASH (Dietary Approaches to Stop Hypertension) diet. To eat this way: ? Eat plenty of fresh fruits and vegetables. Try to fill half of your plate at each meal with fruits and vegetables. ? Eat whole grains, such as whole wheat pasta, brown rice, or whole grain bread. Fill about one quarter of your plate with whole grains. ? Eat low-fat diary products. ? Avoid fatty cuts of meat, processed or cured meats, and poultry with skin. Fill about one quarter of your plate with lean proteins such as fish, chicken without skin, beans, eggs, and tofu. ? Avoid premade and processed foods. These tend to be higher in sodium, added sugar, and fat.  Reduce your daily sodium intake. Most people with hypertension should eat less than 1,500 mg of sodium a day.  Limit alcohol intake to no more than 1 drink a day for nonpregnant women and 2 drinks a day for men. One drink equals 12 oz of beer, 5 oz of wine, or 1 oz of hard liquor. Lifestyle  Work with your health care  provider to maintain a healthy body weight, or to lose weight. Ask what an ideal weight is for you.  Get at least 30 minutes of exercise that causes your heart to beat faster (aerobic exercise) most days of the week. Activities may include walking, swimming, or biking.  Include exercise to strengthen your muscles (resistance exercise), such as weight lifting, as part of your weekly exercise routine. Try to do these types of exercises for 30 minutes at least 3 days a week.  Do not use any products that contain nicotine or tobacco, such as cigarettes and e-cigarettes. If you need help quitting, ask your health care provider.  Control any long-term (chronic) conditions you have, such as high cholesterol or  diabetes. Monitoring  Monitor your blood pressure at home as told by your health care provider. Your personal target blood pressure may vary depending on your medical conditions, your age, and other factors.  Have your blood pressure checked regularly, as often as told by your health care provider. Working with your health care provider  Review all the medicines you take with your health care provider because there may be side effects or interactions.  Talk with your health care provider about your diet, exercise habits, and other lifestyle factors that may be contributing to hypertension.  Visit your health care provider regularly. Your health care provider can help you create and adjust your plan for managing hypertension. Will I need medicine to control my blood pressure? Your health care provider may prescribe medicine if lifestyle changes are not enough to get your blood pressure under control, and if:  Your systolic blood pressure is 130 or higher.  Your diastolic blood pressure is 80 or higher. Take medicines only as told by your health care provider. Follow the directions carefully. Blood pressure medicines must be taken as prescribed. The medicine does not work as well when you skip doses. Skipping doses also puts you at risk for problems. Contact a health care provider if:  You think you are having a reaction to medicines you have taken.  You have repeated (recurrent) headaches.  You feel dizzy.  You have swelling in your ankles.  You have trouble with your vision. Get help right away if:  You develop a severe headache or confusion.  You have unusual weakness or numbness, or you feel faint.  You have severe pain in your chest or abdomen.  You vomit repeatedly.  You have trouble breathing. Summary  Hypertension is when the force of blood pumping through your arteries is too strong. If this condition is not controlled, it may put you at risk for serious  complications.  Your personal target blood pressure may vary depending on your medical conditions, your age, and other factors. For most people, a normal blood pressure is less than 120/80.  Hypertension is managed by lifestyle changes, medicines, or both. Lifestyle changes include weight loss, eating a healthy, low-sodium diet, exercising more, and limiting alcohol. This information is not intended to replace advice given to you by your health care provider. Make sure you discuss any questions you have with your health care provider. Document Released: 04/23/2012 Document Revised: 06/27/2016 Document Reviewed: 06/27/2016 Elsevier Interactive Patient Education  2019 [employer].

## 2019-01-30 ENCOUNTER — Telehealth (HOSPITAL_COMMUNITY): Payer: Self-pay

## 2019-01-30 NOTE — Telephone Encounter (Signed)
Telephoned patient at home number and left message, return call to [facility].

## 2019-02-02 ENCOUNTER — Other Ambulatory Visit: Payer: Self-pay | Admitting: Nurse Practitioner

## 2019-02-02 DIAGNOSIS — M5431 Sciatica, right side: Secondary | ICD-10-CM

## 2019-02-04 ENCOUNTER — Ambulatory Visit: Payer: Self-pay | Admitting: Gastroenterology

## 2019-02-09 ENCOUNTER — Other Ambulatory Visit: Payer: Self-pay | Admitting: *Deleted

## 2019-02-09 DIAGNOSIS — Z1231 Encounter for screening mammogram for malignant neoplasm of breast: Secondary | ICD-10-CM

## 2019-02-23 ENCOUNTER — Ambulatory Visit: Payer: Self-pay | Admitting: Pharmacist

## 2019-02-24 ENCOUNTER — Other Ambulatory Visit: Payer: Self-pay | Admitting: Family Medicine

## 2019-02-26 ENCOUNTER — Ambulatory Visit: Payer: Self-pay | Admitting: Pharmacist

## 2019-03-04 ENCOUNTER — Ambulatory Visit: Payer: Self-pay | Attending: Nurse Practitioner | Admitting: Pharmacist

## 2019-03-04 ENCOUNTER — Other Ambulatory Visit: Payer: Self-pay

## 2019-03-04 VITALS — BP 146/46 | HR 67

## 2019-03-04 DIAGNOSIS — E1122 Type 2 diabetes mellitus with diabetic chronic kidney disease: Secondary | ICD-10-CM

## 2019-03-04 DIAGNOSIS — N183 Chronic kidney disease, stage 3 (moderate): Secondary | ICD-10-CM

## 2019-03-04 LAB — POCT GLYCOSYLATED HEMOGLOBIN (HGB A1C): Hemoglobin A1C: 7.6 % — AB (ref 4.0–5.6)

## 2019-03-04 NOTE — Progress Notes (Signed)
    S:    PCP: Zelda   No chief complaint on file.  Patient arrives in good spirits.  Presents for diabetes evaluation, education, and management. Patient was referred and last seen by Primary Care Provider on 01/27/19.   Family/Social History:  - FHx: neg for DM - Tobacco: 0.5 ppd (15 years) - Alcohol: does not use  Insurance coverage/medication affordability: self pay  Patient reports adherence with medications.  Current diabetes medications include:  - metformin 500 mg XR; takes two tabs (1000 mg) with breakfast  Current hypertension medications include:  - amlodipine 10 mg daily - clonidine 0.1 mg BID  - Hyzaar 100-25 mg daily - metoprolol tartrate 50 mg BID Current hyperlipidemia medications include:  - Omega-3 fatty acids BID - atorvastatin 40 mg daily  Patient denies hypoglycemic events.  Patient reported dietary habits:  - admits to dietary indiscretion   Patient-reported exercise habits:  - denies    Patient denies nocturia.  Patient reports baseline neuropathy. Patient denies visual changes. Patient reports self foot exams.    O:  Lab Results  Component Value Date   HGBA1C 7.6 (A) 03/04/2019   Vitals:   03/04/19 1043  BP: (!) 146/46  Pulse: 67    Lipid Panel     Component Value Date/Time   CHOL 206 (H) 07/04/2018 1007   CHOL 304 (H) 04/09/2017 1128   TRIG 101 07/04/2018 1007   HDL 50 07/04/2018 1007   HDL 51 04/09/2017 1128   CHOLHDL 4.1 07/04/2018 1007   VLDL 20 07/04/2018 1007   LDLCALC 136 (H) 07/04/2018 1007   LDLCALC 189 (H) 04/09/2017 1128   Clinical ASCVD: No  The 10-year ASCVD risk score Mikey Bussing DC Jr., et al., 2013) is: 39.2%   Values used to calculate the score:     Age: 58 years     Sex: Female     Is Non-Hispanic African American: Yes     Diabetic: Yes     Tobacco smoker: Yes     Systolic Blood Pressure: 845 mmHg     Is BP treated: Yes     HDL Cholesterol: 50 mg/dL     Total Cholesterol: 206 mg/dL   A/P: Diabetes  longstanding currently above goal; A1c today 7.6 up from 6. Patient is able to verbalize appropriate hypoglycemia management plan. Patient is adherent with medication. Control is suboptimal due to dietary indiscretion and physical inactivity. Pt admits that her lifestyle started slipping when the covid-19 pandemic started. She plans to start back on her diet and exercise regimen. Encouraged this and medication compliance.  -Continued metformin -Extensively discussed pathophysiology of DM, recommended lifestyle interventions, dietary effects on glycemic control -Counseled on s/sx of and management of hypoglycemia -Next A1C anticipated 05/2019.   ASCVD risk - primary prevention in patient with DM. Last LDL is not controlled. ASCVD risk score is >20%  - high intensity statin indicated.  -Continued atorvastatin 40 mg.  -Encouraged low-fat, heart-healthy diet.  HM: UTD on PNA and tetanus vaccines.   Written patient instructions provided. Total time in face to face counseling 15 minutes.   Follow up w/ PCP in Aug.     Benard Halsted, PharmD, [place] [phone]

## 2019-03-04 NOTE — Patient Instructions (Signed)
Thank you for coming to see me today. Please do the following:  1. Continue medications.  2. Continue checking blood sugars at home.   3. Continue making the lifestyle changes we've discussed together during our visit. Diet and exercise play a significant role in improving your blood sugars.  4. Follow-up with  PCP.    Hypoglycemia or low blood sugar:   Low blood sugar can happen quickly and may become an emergency if not treated right away.   While this shouldn't happen often, it can be brought upon if you skip a meal or do not eat enough. Also, if your insulin or other diabetes medications are dosed too high, this can cause your blood sugar to go to low.   Warning signs of low blood sugar include: 1. Feeling shaky or dizzy 2. Feeling weak or tired  3. Excessive hunger 4. Feeling anxious or upset  5. Sweating even when you aren't exercising  What to do if I experience low blood sugar? 1. Check your blood sugar with your meter. If lower than 70, proceed to step 2.  2. Treat with 3-4 glucose tablets or 3 packets of regular sugar. If these aren't around, you can try hard candy. Yet another option would be to drink 4 ounces of fruit juice or 6 ounces of REGULAR soda.  3. Re-check your sugar in 15 minutes. If it is still below 70, do what you did in step 2 again. If has come back up, go ahead and eat a snack or small meal at this time.

## 2019-03-05 ENCOUNTER — Encounter: Payer: Self-pay | Admitting: Pharmacist

## 2019-03-19 ENCOUNTER — Ambulatory Visit (HOSPITAL_COMMUNITY): Payer: Self-pay

## 2019-04-03 ENCOUNTER — Other Ambulatory Visit: Payer: Self-pay

## 2019-04-03 ENCOUNTER — Ambulatory Visit: Payer: Self-pay | Attending: Nurse Practitioner | Admitting: Nurse Practitioner

## 2019-04-03 NOTE — Progress Notes (Signed)
Attempted to call patient for televist. No answer.

## 2019-04-07 ENCOUNTER — Encounter: Payer: Self-pay | Admitting: Nurse Practitioner

## 2019-04-07 ENCOUNTER — Ambulatory Visit: Payer: Self-pay | Attending: Nurse Practitioner | Admitting: Nurse Practitioner

## 2019-04-07 DIAGNOSIS — I10 Essential (primary) hypertension: Secondary | ICD-10-CM

## 2019-04-07 DIAGNOSIS — J3089 Other allergic rhinitis: Secondary | ICD-10-CM

## 2019-04-07 DIAGNOSIS — K219 Gastro-esophageal reflux disease without esophagitis: Secondary | ICD-10-CM

## 2019-04-07 MED ORDER — PANTOPRAZOLE SODIUM 20 MG PO TBEC: 20.0000 mg | DELAYED_RELEASE_TABLET | Freq: Two times a day (BID) | ORAL | 3 refills | Status: DC

## 2019-04-07 MED ORDER — METOPROLOL TARTRATE 50 MG PO TABS: 50.0000 mg | ORAL_TABLET | Freq: Two times a day (BID) | ORAL | 0 refills | Status: DC

## 2019-04-07 MED ORDER — FLUTICASONE PROPIONATE 50 MCG/ACT NA SUSP: 2.0000 | Freq: Every day | NASAL | 1 refills | Status: DC

## 2019-04-07 MED ORDER — LOSARTAN POTASSIUM-HCTZ 100-25 MG PO TABS: 1.0000 | ORAL_TABLET | Freq: Every day | ORAL | 0 refills | Status: DC

## 2019-04-07 MED ORDER — CLONIDINE HCL 0.1 MG PO TABS: 0.1000 mg | ORAL_TABLET | Freq: Two times a day (BID) | ORAL | 0 refills | Status: DC

## 2019-04-07 NOTE — Progress Notes (Signed)
Virtual Visit via Telephone Note Due to national recommendations of social distancing due to [place] 19, telehealth visit is felt to be most appropriate for this patient at this time.  I discussed the limitations, risks, security and privacy concerns of performing an evaluation and management service by telephone and the availability of in person appointments. I also discussed with the patient that there may be a patient responsible charge related to this service. The patient expressed understanding and agreed to proceed.    I connected with ALEXXA OTTAVIANI on 04/07/19  at   8:30 AM EDT  EDT by telephone and verified that I am speaking with the correct person using two identifiers.   Consent I discussed the limitations, risks, security and privacy concerns of performing an evaluation and management service by telephone and the availability of in person appointments. I also discussed with the patient that there may be a patient responsible charge related to this service. The patient expressed understanding and agreed to proceed.   Location of Patient: Private Residence   Location of Provider: [place] and [employer] Office    Persons participating in Telemedicine visit: [name] FNP-BC [place]    History of Present Illness: Telemedicine visit for: Follow up  has a past medical history of Acid reflux, Diabetes ([place]), History of anemia, History of hiatal hernia, Hypercholesterolemia, Hypertension, and Jaw inflammation, right (01/30/2017).  DM TYPE 2 Chronic. Fluctuating over the past year. Mostly related to poor dietary habits. She has been monitoring her blood glucose levels 1-2 times per day. Average readings 130-140s. Taking metformin 1000 mg daily, ARB and statin. Denies any hyperglycemic symptoms. She states she is working on her diet and losing weight to help lower her a1c for her next visit in  A few months.  Lab Results  Component Value Date   HGBA1C 7.6 (A) 03/04/2019   Essential Hypertension Labile. She does not monitor at home. Sometimes runs out of her medications before her office visits and does not call for refill request. Current medications include amlodipine 10 mg, clonidine 0.1 mg BID, Hyzaar 100-25 mg daily and metoprool tartrate 50 mg BID. Denies chest pain, shortness of breath, palpitations, lightheadedness, dizziness, headaches or BLE edema.   BP Readings from Last 3 Encounters:  03/04/19 (!) 146/46  01/27/19 124/86  10/31/18 (!) 168/91    Dyslipidemia LDL not at goal <70. She does not consistently take her atorvastatin 40mg  however she does deny any statin intolerance.  Lab Results  Component Value Date   LDLCALC 136 (H) 07/04/2018    Past Medical History:  Diagnosis Date  . Acid reflux   . Diabetes ([place])   . History of anemia   . History of hiatal hernia   . Hypercholesterolemia   . Hypertension   . Jaw inflammation, right 01/30/2017    Past Surgical History:  Procedure Laterality Date  . COLONOSCOPY N/A 03/05/2018   Procedure: COLONOSCOPY;  Surgeon: Danie Binder, MD;  [facility];  Service: Endoscopy;  Laterality: N/A;  12:00pm  . COLONOSCOPY N/A 03/06/2018   Procedure: COLONOSCOPY;  Surgeon: Danie Binder, MD;  [facility];  Service: Endoscopy;  Laterality: N/A;  . DENTAL SURGERY    . ESOPHAGOGASTRODUODENOSCOPY  2011   white, raised, somewhat fixed plaques esophageal mucosa concerning for possible Candida esophagitis s/p brushing. Multiple gastric antral ulcerations, multiple small bulbar erosions and ulcerations.   . ESOPHAGOGASTRODUODENOSCOPY N/A 03/05/2018   Procedure: ESOPHAGOGASTRODUODENOSCOPY (EGD);  Surgeon:  Fields, Marga Melnick, MD;  [facility];  Service: Endoscopy;  Laterality: N/A;  . ESOPHAGOGASTRODUODENOSCOPY N/A 03/06/2018   Procedure: ESOPHAGOGASTRODUODENOSCOPY (EGD);  Surgeon: Danie Binder, MD;  [facility];  Service: Endoscopy;   Laterality: N/A;  . TOOTH EXTRACTION N/A 02/01/2017   Procedure: FULL DENTAL EXTRACTIONS;  Surgeon: Diona Browner, DDS;  Location: [place];  Service: Oral Surgery;  Laterality: N/A;  . TUBAL LIGATION      Family History  Problem Relation Age of Onset  . Breast cancer Mother   . Colon cancer Neg Hx   . Colon polyps Neg Hx     Social History   Socioeconomic History  . Marital status: Single    Spouse name: Not on file  . Number of children: Not on file  . Years of education: Not on file  . Highest education level: Not on file  Occupational History  . Not on file  Social Needs  . Financial resource strain: Not on file  . Food insecurity    Worry: Not on file    Inability: Not on file  . Transportation needs    Medical: Not on file    Non-medical: Not on file  Tobacco Use  . Smoking status: Current Every Day Smoker    Packs/day: 0.50    Years: 15.00    Pack years: 7.50    Types: Cigarettes  . Smokeless tobacco: Never Used  Substance and Sexual Activity  . Alcohol use: No  . Drug use: No  . Sexual activity: Not Currently  Lifestyle  . Physical activity    Days per week: Not on file    Minutes per session: Not on file  . Stress: Not on file  Relationships  . Social Herbalist on phone: Not on file    Gets together: Not on file    Attends religious service: Not on file    Active member of club or organization: Not on file    Attends meetings of clubs or organizations: Not on file    Relationship status: Not on file  Other Topics Concern  . Not on file  Social History Narrative  . Not on file     Observations/Objective: Awake, alert and oriented x 3   Review of Systems  Constitutional: Negative for fever, malaise/fatigue and weight loss.  HENT: Negative.  Negative for nosebleeds.   Eyes: Negative.  Negative for blurred vision, double vision and photophobia.  Respiratory: Negative.  Negative for cough and shortness of breath.   Cardiovascular:  Negative.  Negative for chest pain, palpitations and leg swelling.  Gastrointestinal: Positive for heartburn. Negative for nausea and vomiting.  Musculoskeletal: Negative.  Negative for myalgias.  Neurological: Negative.  Negative for dizziness, focal weakness, seizures and headaches.  Endo/Heme/Allergies: Positive for environmental allergies.  Psychiatric/Behavioral: Negative.  Negative for suicidal ideas.    Assessment and Plan: Tayli was seen today for follow-up.  Diagnoses and all orders for this visit:  Essential hypertension -     cloNIDine (CATAPRES) 0.1 MG tablet; Take 1 tablet (0.1 mg total) by mouth 2 (two) times daily. -     losartan-hydrochlorothiazide (HYZAAR) 100-25 MG tablet; Take 1 tablet by mouth daily. -     metoprolol tartrate (LOPRESSOR) 50 MG tablet; Take 1 tablet (50 mg total) by mouth 2 (two) times daily.  Continue all antihypertensives as prescribed.  Remember to bring in your blood pressure log with you for your follow up appointment.  DASH/Mediterranean Diets are healthier choices for HTN.    Allergic rhinitis due to other allergic trigger, unspecified seasonality -     fluticasone (FLONASE) 50 MCG/ACT nasal spray; Place 2 sprays into both nostrils daily.  Gastroesophageal reflux disease, esophagitis presence not specified -     pantoprazole (PROTONIX) 20 MG tablet; Take 1 tablet (20 mg total) by mouth 2 (two) times daily before a meal. INSTRUCTIONS: Avoid GERD Triggers: acidic, spicy or fried foods, caffeine, coffee, sodas,  alcohol and chocolate.   WELL CONTROLLED    Follow Up Instructions Return in about 2 months (around 06/07/2019) for HTN/HPL/DM.     I discussed the assessment and treatment plan with the patient. The patient was provided an opportunity to ask questions and all were answered. The patient agreed with the plan and demonstrated an understanding of the instructions.   The patient was advised to call back or seek an in-person evaluation  if the symptoms worsen or if the condition fails to improve as anticipated.  I provided 22 minutes of non-face-to-face time during this encounter including median intraservice time, reviewing previous notes, labs, imaging, medications and explaining diagnosis and management.  Gildardo Pounds, FNP-BC

## 2019-04-10 ENCOUNTER — Other Ambulatory Visit: Payer: Self-pay | Admitting: "Endocrinology

## 2019-04-29 ENCOUNTER — Ambulatory Visit: Payer: Self-pay | Admitting: Pharmacist

## 2019-05-06 ENCOUNTER — Other Ambulatory Visit: Payer: Self-pay | Admitting: Nurse Practitioner

## 2019-05-06 DIAGNOSIS — M5431 Sciatica, right side: Secondary | ICD-10-CM

## 2019-05-15 ENCOUNTER — Encounter: Payer: Self-pay | Admitting: Nurse Practitioner

## 2019-05-15 ENCOUNTER — Ambulatory Visit: Payer: Self-pay | Attending: Nurse Practitioner | Admitting: Nurse Practitioner

## 2019-05-15 ENCOUNTER — Ambulatory Visit (HOSPITAL_BASED_OUTPATIENT_CLINIC_OR_DEPARTMENT_OTHER): Payer: Self-pay | Admitting: Pharmacist

## 2019-05-15 ENCOUNTER — Other Ambulatory Visit: Payer: Self-pay

## 2019-05-15 ENCOUNTER — Encounter: Payer: Self-pay | Admitting: Pharmacist

## 2019-05-15 VITALS — BP 182/68 | HR 61 | Temp 98.0°F | Ht 67.0 in | Wt 178.0 lb

## 2019-05-15 DIAGNOSIS — E1169 Type 2 diabetes mellitus with other specified complication: Secondary | ICD-10-CM

## 2019-05-15 DIAGNOSIS — E782 Mixed hyperlipidemia: Secondary | ICD-10-CM

## 2019-05-15 DIAGNOSIS — I10 Essential (primary) hypertension: Secondary | ICD-10-CM

## 2019-05-15 DIAGNOSIS — Z23 Encounter for immunization: Secondary | ICD-10-CM

## 2019-05-15 DIAGNOSIS — E079 Disorder of thyroid, unspecified: Secondary | ICD-10-CM

## 2019-05-15 LAB — POCT GLYCOSYLATED HEMOGLOBIN (HGB A1C): Hemoglobin A1C: 6.6 % — AB (ref 4.0–5.6)

## 2019-05-15 LAB — GLUCOSE, POCT (MANUAL RESULT ENTRY): POC Glucose: 140 mg/dl — AB (ref 70–99)

## 2019-05-15 MED ORDER — HYDRALAZINE HCL 25 MG PO TABS: 25.0000 mg | ORAL_TABLET | Freq: Two times a day (BID) | ORAL | 1 refills | Status: DC

## 2019-05-15 NOTE — Progress Notes (Signed)
Patient presents for vaccination against influenza per orders of Zelda. Consent given. Counseling provided. No contraindications exists. Vaccine administered without incident.   

## 2019-05-15 NOTE — Progress Notes (Signed)
Assessment & Plan:  Sydney Morrow was seen today for follow-up.  Diagnoses and all orders for this visit:  Type 2 diabetes mellitus with other specified complication, without long-term current use of insulin (HCC) -     Glucose (CBG) -     HgB A1c Continue blood sugar control as discussed in office today, low carbohydrate diet, and regular physical exercise as tolerated, 150 minutes per week (30 min each day, 5 days per week, or 50 min 3 days per week). Keep blood sugar logs with fasting goal of 90-130 mg/dl, post prandial (after you eat) less than 180.  For Hypoglycemia: BS <60 and Hyperglycemia BS >400; contact the clinic ASAP. Annual eye exams and foot exams are recommended.   Essential hypertension -     hydrALAZINE (APRESOLINE) 25 MG tablet; Take 1 tablet (25 mg total) by mouth 2 (two) times daily. -     Basic Metabolic Panel -     amLODipine (NORVASC) 10 MG tablet; Take 1 tablet (10 mg total) by mouth daily. Continue all antihypertensives as prescribed.  Remember to bring in your blood pressure log with you for your follow up appointment.  DASH/Mediterranean Diets are healthier choices for HTN.    Thyroid condition -     TSH  Mixed hyperlipidemia -     atorvastatin (LIPITOR) 40 MG tablet; Take 1 tablet (40 mg total) by mouth daily. INSTRUCTIONS: Work on a low fat, heart healthy diet and participate in regular aerobic exercise program by working out at least 150 minutes per week; 5 days a week-30 minutes per day. Avoid red meat, fried foods. junk foods, sodas, sugary drinks, unhealthy snacking, alcohol and smoking.  Drink at least 48oz of water per day and monitor your carbohydrate intake daily.   Patient has been counseled on age-appropriate routine health concerns for screening and prevention. These are reviewed and up-to-date. Referrals have been placed accordingly. Immunizations are up-to-date or declined.    Subjective:   Chief Complaint  Patient presents with  . Follow-up   Pt. is here to follow up on diabetes and hypetension.    HPI Sydney Morrow 58 y.o. female presents to office today for follow up to HTN.   Mammogram scheduled for 06-02-2019  Essential Hypertension Not well controlled. She is still smoking as many as 10 cigarettes per day. Not taking clonidine as prescribed. States it makes her too sleepy at work so she only takes it sometimes and then only once a day. Will stop and add hydralazine 25 mg BID today. I am not confident she will be compliant with taking an antihypertensive more than 2 times per day. She is also to continue on metoprolol 50 mg BID, losartan-HCTZ 100-25 mg daily, amlodipine 10 mg daily. Denies chest pain, shortness of breath, palpitations, lightheadedness, dizziness, headaches or BLE edema.  BP Readings from Last 3 Encounters:  05/15/19 (!) 182/68  03/04/19 (!) 146/46  01/27/19 124/86    DM TYPE 2 Well controlled. Down from 7.6 to 6.6 today. She is monitoring blood glucose levels "about twice a week".   Reports readings 110-140s. She denies any hypo or hyperglycemic symptoms. Taking metformin XR 1000 mg daily. She had eye exam earlier this year and denies cataracts, glaucoma or DR. I have asked her to have her opthalmologist fax results to our office.  Lab Results  Component Value Date   HGBA1C 6.6 (A) 05/15/2019   Mixed Hyperlipidemia LDL not at goal >70. She is taking fish oil and atorvastatin 40  mg. Fasting lipid panel pending. She denies any statin intolerance or myalgias. She is not diet or exercise compliant.  Lab Results  Component Value Date   LDLCALC 136 (H) 07/04/2018    She is currently taking Euthyrox. Thyroid levels have been normal. Will defer to Dr. [name] regarding her thyroid and medication management.    Review of Systems  Constitutional: Negative for fever, malaise/fatigue and weight loss.  HENT: Negative.  Negative for nosebleeds.   Eyes: Negative.  Negative for blurred vision, double vision and  photophobia.  Respiratory: Negative.  Negative for cough and shortness of breath.   Cardiovascular: Negative.  Negative for chest pain, palpitations and leg swelling.  Gastrointestinal: Positive for heartburn. Negative for nausea and vomiting.  Musculoskeletal: Negative.  Negative for myalgias.  Neurological: Negative.  Negative for dizziness, focal weakness, seizures and headaches.  Psychiatric/Behavioral: Negative.  Negative for suicidal ideas.    Past Medical History:  Diagnosis Date  . Acid reflux   . Diabetes ([place])   . History of anemia   . History of hiatal hernia   . Hypercholesterolemia   . Hypertension   . Jaw inflammation, right 01/30/2017    Past Surgical History:  Procedure Laterality Date  . COLONOSCOPY N/A 03/05/2018   Procedure: COLONOSCOPY;  Surgeon: Sydney Binder, MD;  [facility];  Service: Endoscopy;  Laterality: N/A;  12:00pm  . COLONOSCOPY N/A 03/06/2018   Procedure: COLONOSCOPY;  Surgeon: Sydney Binder, MD;  [facility];  Service: Endoscopy;  Laterality: N/A;  . DENTAL SURGERY    . ESOPHAGOGASTRODUODENOSCOPY  2011   white, raised, somewhat fixed plaques esophageal mucosa concerning for possible Candida esophagitis s/p brushing. Multiple gastric antral ulcerations, multiple small bulbar erosions and ulcerations.   . ESOPHAGOGASTRODUODENOSCOPY N/A 03/05/2018   Procedure: ESOPHAGOGASTRODUODENOSCOPY (EGD);  Surgeon: Sydney Binder, MD;  [facility];  Service: Endoscopy;  Laterality: N/A;  . ESOPHAGOGASTRODUODENOSCOPY N/A 03/06/2018   Procedure: ESOPHAGOGASTRODUODENOSCOPY (EGD);  Surgeon: Sydney Binder, MD;  [facility];  Service: Endoscopy;  Laterality: N/A;  . TOOTH EXTRACTION N/A 02/01/2017   Procedure: FULL DENTAL EXTRACTIONS;  Surgeon: Sydney Morrow, DDS;  Location: [place];  Service: Oral Surgery;  Laterality: N/A;  . TUBAL LIGATION      Family History  Problem Relation Age of Onset  . Breast cancer Mother    . Colon cancer Neg Hx   . Colon polyps Neg Hx     Social History Reviewed with no changes to be made today.   Outpatient Medications Prior to Visit  Medication Sig Dispense Refill  . Blood Glucose Monitoring Suppl (TRUE METRIX METER) w/Device KIT Use as directed to check blood sugar at home. 1 kit 0  . Cholecalciferol (DIALYVITE VITAMIN D 5000 PO) Take by mouth daily.    . Coenzyme Q10 (CO Q 10 PO) Take by mouth daily.    Arna Medici 50 MCG tablet TAKE 1 TABLET BY MOUTH ONCE DAILY BEFORE BREAKFAST 90 tablet 0  . glucose blood (TRUE METRIX BLOOD GLUCOSE TEST) test strip Use as instructed 100 each 12  . loratadine (CLARITIN) 10 MG tablet TAKE 1 TABLET BY MOUTH ONCE DAILY AS NEEDED FOR ALLERGIES 30 tablet 0  . losartan-hydrochlorothiazide (HYZAAR) 100-25 MG tablet Take 1 tablet by mouth daily. 90 tablet 0  . metFORMIN (GLUCOPHAGE-XR) 500 MG 24 hr tablet TAKE 2 TABLETS BY MOUTH ONCE DAILY WITH BREAKFAST 180 tablet 0  . methocarbamol (ROBAXIN) 750 MG tablet Take 1 tablet  by mouth 4 times daily 120 tablet 0  . metoprolol tartrate (LOPRESSOR) 50 MG tablet Take 1 tablet (50 mg total) by mouth 2 (two) times daily. 180 tablet 0  . Omega-3 Fatty Acids (FISH OIL PO) Take by mouth 2 (two) times a day.    . TRUEPLUS LANCETS 28G MISC Use as directed. 100 each 11  . cloNIDine (CATAPRES) 0.1 MG tablet Take 1 tablet (0.1 mg total) by mouth 2 (two) times daily. 180 tablet 0  . fluticasone (FLONASE) 50 MCG/ACT nasal spray Place 2 sprays into both nostrils daily. 16 g 1  . pantoprazole (PROTONIX) 20 MG tablet Take 1 tablet (20 mg total) by mouth 2 (two) times daily before a meal. 180 tablet 3  . amLODipine (NORVASC) 10 MG tablet Take 1 tablet (10 mg total) by mouth daily. 90 tablet 0  . atorvastatin (LIPITOR) 40 MG tablet Take 1 tablet (40 mg total) by mouth daily. 90 tablet 0   No facility-administered medications prior to visit.     Allergies  Allergen Reactions  . Aspirin Other (See Comments)     Mouth, upper lip, nose area becomes numb  . Morphine And Related Other (See Comments)    "Did not feel good"  . Penicillins Other (See Comments)    Unknown Has patient had a PCN reaction causing immediate rash, facial/tongue/throat swelling, SOB or lightheadedness with hypotension: Unknown Has patient had a PCN reaction causing severe rash involving mucus membranes or skin necrosis: Unknown Has patient had a PCN reaction that required hospitalization: No Has patient had a PCN reaction occurring within the last 10 years: No If all of the above answers are "NO", then may proceed with Cephalosporin use.        Objective:    BP (!) 182/68 (BP Location: Right Arm, Patient Position: Sitting, Cuff Size: Normal)   Pulse 61   Temp 98 F (36.7 C) (Oral)   Ht 5' 7"  (1.702 m)   Wt 178 lb (80.7 kg)   LMP 08/13/2014 (Approximate)   SpO2 99%   BMI 27.88 kg/m  Wt Readings from Last 3 Encounters:  05/15/19 178 lb (80.7 kg)  01/27/19 174 lb 9.6 oz (79.2 kg)  10/31/18 177 lb (80.3 kg)    Physical Exam Vitals signs and nursing note reviewed.  Constitutional:      Appearance: She is well-developed.  HENT:     Head: Normocephalic and atraumatic.  Neck:     Musculoskeletal: Normal range of motion.  Cardiovascular:     Rate and Rhythm: Normal rate and regular rhythm.     Heart sounds: Normal heart sounds. No murmur. No friction rub. No gallop.   Pulmonary:     Effort: Pulmonary effort is normal. No tachypnea or respiratory distress.     Breath sounds: Normal breath sounds. No decreased breath sounds, wheezing, rhonchi or rales.  Chest:     Chest wall: No tenderness.  Abdominal:     General: Bowel sounds are normal.     Palpations: Abdomen is soft.  Musculoskeletal: Normal range of motion.  Skin:    General: Skin is warm and dry.  Neurological:     Mental Status: She is alert and oriented to person, place, and time.     Coordination: Coordination normal.  Psychiatric:         Behavior: Behavior normal. Behavior is cooperative.        Thought Content: Thought content normal.        Judgment: Judgment normal.  Patient has been counseled extensively about nutrition and exercise as well as the importance of adherence with medications and regular follow-up. The patient was given clear instructions to go to ER or return to medical center if symptoms don't improve, worsen or new problems develop. The patient verbalized understanding.   Follow-up: Return for 3 week BP check. Then schedule PAP with me.   Gildardo Pounds, FNP-BC [facility] and [place], [place]   05/16/2019, 1:33 PM

## 2019-05-16 ENCOUNTER — Encounter: Payer: Self-pay | Admitting: Nurse Practitioner

## 2019-05-16 LAB — BASIC METABOLIC PANEL
BUN/Creatinine Ratio: 19 (ref 9–23)
BUN: 23 mg/dL (ref 6–24)
CO2: 25 mmol/L (ref 20–29)
Calcium: 9.7 mg/dL (ref 8.7–10.2)
Chloride: 101 mmol/L (ref 96–106)
Creatinine, Ser: 1.21 mg/dL — ABNORMAL HIGH (ref 0.57–1.00)
GFR calc Af Amer: 57 mL/min/{1.73_m2} — ABNORMAL LOW (ref >59)
GFR calc non Af Amer: 49 mL/min/{1.73_m2} — ABNORMAL LOW (ref >59)
Glucose: 107 mg/dL — ABNORMAL HIGH (ref 65–99)
Potassium: 4.6 mmol/L (ref 3.5–5.2)
Sodium: 138 mmol/L (ref 134–144)

## 2019-05-16 LAB — TSH: TSH: 0.607 u[IU]/mL (ref 0.450–4.500)

## 2019-05-16 MED ORDER — AMLODIPINE BESYLATE 10 MG PO TABS: 10.0000 mg | ORAL_TABLET | Freq: Every day | ORAL | 0 refills | Status: DC

## 2019-05-16 MED ORDER — ATORVASTATIN CALCIUM 40 MG PO TABS: 40.0000 mg | ORAL_TABLET | Freq: Every day | ORAL | 0 refills | Status: DC

## 2019-06-02 ENCOUNTER — Ambulatory Visit (HOSPITAL_COMMUNITY): Payer: Self-pay

## 2019-06-05 ENCOUNTER — Ambulatory Visit: Payer: Self-pay | Admitting: Pharmacist

## 2019-06-05 ENCOUNTER — Other Ambulatory Visit: Payer: Self-pay | Admitting: Nurse Practitioner

## 2019-06-05 DIAGNOSIS — Z1231 Encounter for screening mammogram for malignant neoplasm of breast: Secondary | ICD-10-CM

## 2019-06-09 ENCOUNTER — Ambulatory Visit: Payer: Self-pay | Admitting: Pharmacist

## 2019-06-09 ENCOUNTER — Other Ambulatory Visit: Payer: Self-pay | Admitting: Nurse Practitioner

## 2019-06-11 ENCOUNTER — Encounter: Payer: Self-pay | Admitting: "Endocrinology

## 2019-06-11 ENCOUNTER — Other Ambulatory Visit: Payer: Self-pay

## 2019-06-11 ENCOUNTER — Ambulatory Visit: Payer: Self-pay | Admitting: Pharmacist

## 2019-06-11 ENCOUNTER — Ambulatory Visit (INDEPENDENT_AMBULATORY_CARE_PROVIDER_SITE_OTHER): Payer: Self-pay | Admitting: "Endocrinology

## 2019-06-11 DIAGNOSIS — I10 Essential (primary) hypertension: Secondary | ICD-10-CM

## 2019-06-11 DIAGNOSIS — E1122 Type 2 diabetes mellitus with diabetic chronic kidney disease: Secondary | ICD-10-CM

## 2019-06-11 DIAGNOSIS — N1831 Chronic kidney disease, stage 3a: Secondary | ICD-10-CM

## 2019-06-11 DIAGNOSIS — E1121 Type 2 diabetes mellitus with diabetic nephropathy: Secondary | ICD-10-CM

## 2019-06-11 DIAGNOSIS — E039 Hypothyroidism, unspecified: Secondary | ICD-10-CM

## 2019-06-11 DIAGNOSIS — E782 Mixed hyperlipidemia: Secondary | ICD-10-CM

## 2019-06-11 NOTE — Progress Notes (Signed)
06/11/2019, 8:15 PM                                                     Endocrinology Telehealth Visit Follow up Note -During COVID -19 Pandemic  This visit type was conducted due to national recommendations for restrictions regarding the COVID-19 Pandemic  in an effort to limit this patient's exposure and mitigate transmission of the corona virus.  Due to her co-morbid illnesses, Sydney Morrow is at  moderate to high risk for complications without adequate follow up.  This format is felt to be most appropriate for her at this time.  I connected with this patient on 06/11/2019   by telephone and verified that I am speaking with the correct person using two identifiers. Sydney Morrow, June 02, 1961. she has verbally consented to this visit. All issues noted in this document were discussed and addressed. The format was not optimal for physical exam.    Subjective:    Patient ID: Sydney Morrow, female    DOB: 1960/12/04.  Sydney Morrow is engaged in telehealth visit with her verbal consent  for a follow-up in the management of her currently controlled type 2 diabetes, hypothyroidism, hyperlipidemia.    PCP: Gildardo Pounds, NP   Past Medical History:  Diagnosis Date  . Acid reflux   . Diabetes ([place])   . History of anemia   . History of hiatal hernia   . Hypercholesterolemia   . Hypertension   . Jaw inflammation, right 01/30/2017   Past Surgical History:  Procedure Laterality Date  . COLONOSCOPY N/A 03/05/2018   Procedure: COLONOSCOPY;  Surgeon: Danie Binder, MD;  [facility];  Service: Endoscopy;  Laterality: N/A;  12:00pm  . COLONOSCOPY N/A 03/06/2018   Procedure: COLONOSCOPY;  Surgeon: Danie Binder, MD;  [facility];  Service: Endoscopy;  Laterality: N/A;  . DENTAL SURGERY    . ESOPHAGOGASTRODUODENOSCOPY  2011   white, raised, somewhat fixed plaques esophageal mucosa  concerning for possible Candida esophagitis s/p brushing. Multiple gastric antral ulcerations, multiple small bulbar erosions and ulcerations.   . ESOPHAGOGASTRODUODENOSCOPY N/A 03/05/2018   Procedure: ESOPHAGOGASTRODUODENOSCOPY (EGD);  Surgeon: Danie Binder, MD;  [facility];  Service: Endoscopy;  Laterality: N/A;  . ESOPHAGOGASTRODUODENOSCOPY N/A 03/06/2018   Procedure: ESOPHAGOGASTRODUODENOSCOPY (EGD);  Surgeon: Danie Binder, MD;  [facility];  Service: Endoscopy;  Laterality: N/A;  . TOOTH EXTRACTION N/A 02/01/2017   Procedure: FULL DENTAL EXTRACTIONS;  Surgeon: Diona Browner, DDS;  Location: [facility];  Service: Oral Surgery;  Laterality: N/A;  . TUBAL LIGATION     Social History   Socioeconomic History  . Marital status: Single    Spouse name: Not on file  . Number of children: Not on file  . Years of education: Not on file  . Highest education level: Not on file  Occupational History  . Not on file  Social Needs  . Financial resource strain: Not  on file  . Food insecurity    Worry: Not on file    Inability: Not on file  . Transportation needs    Medical: Not on file    Non-medical: Not on file  Tobacco Use  . Smoking status: Current Every Day Smoker    Packs/day: 0.50    Years: 15.00    Pack years: 7.50    Types: Cigarettes  . Smokeless tobacco: Never Used  Substance and Sexual Activity  . Alcohol use: No  . Drug use: No  . Sexual activity: Not Currently  Lifestyle  . Physical activity    Days per week: Not on file    Minutes per session: Not on file  . Stress: Not on file  Relationships  . Social Herbalist on phone: Not on file    Gets together: Not on file    Attends religious service: Not on file    Active member of club or organization: Not on file    Attends meetings of clubs or organizations: Not on file    Relationship status: Not on file  Other Topics Concern  . Not on file  Social History Narrative  . Not on file    Outpatient Encounter Medications as of 06/11/2019  Medication Sig  . amLODipine (NORVASC) 10 MG tablet Take 1 tablet (10 mg total) by mouth daily.  Marland Kitchen atorvastatin (LIPITOR) 40 MG tablet Take 1 tablet (40 mg total) by mouth daily.  . Blood Glucose Monitoring Suppl (TRUE METRIX METER) w/Device KIT Use as directed to check blood sugar at home.  . Cholecalciferol (DIALYVITE VITAMIN D 5000 PO) Take by mouth daily.  . Coenzyme Q10 (CO Q 10 PO) Take by mouth daily.  . EUTHYROX 50 MCG tablet TAKE 1 TABLET BY MOUTH ONCE DAILY BEFORE BREAKFAST  . fluticasone (FLONASE) 50 MCG/ACT nasal spray Place 2 sprays into both nostrils daily.  Marland Kitchen glucose blood (TRUE METRIX BLOOD GLUCOSE TEST) test strip Use as instructed  . hydrALAZINE (APRESOLINE) 25 MG tablet Take 1 tablet (25 mg total) by mouth 2 (two) times daily.  Marland Kitchen loratadine (CLARITIN) 10 MG tablet TAKE 1 TABLET BY MOUTH ONCE DAILY AS NEEDED FOR ALLERGIES  . losartan-hydrochlorothiazide (HYZAAR) 100-25 MG tablet Take 1 tablet by mouth daily.  . metFORMIN (GLUCOPHAGE-XR) 500 MG 24 hr tablet TAKE 2 TABLETS BY MOUTH ONCE DAILY WITH BREAKFAST  . methocarbamol (ROBAXIN) 750 MG tablet Take 1 tablet by mouth 4 times daily  . metoprolol tartrate (LOPRESSOR) 50 MG tablet Take 1 tablet (50 mg total) by mouth 2 (two) times daily.  . Omega-3 Fatty Acids (FISH OIL PO) Take by mouth 2 (two) times a day.  . pantoprazole (PROTONIX) 20 MG tablet Take 1 tablet (20 mg total) by mouth 2 (two) times daily before a meal.  . TRUEPLUS LANCETS 28G MISC Use as directed.   No facility-administered encounter medications on file as of 06/11/2019.     ALLERGIES: Allergies  Allergen Reactions  . Aspirin Other (See Comments)    Mouth, upper lip, nose area becomes numb  . Morphine And Related Other (See Comments)    "Did not feel good"  . Penicillins Other (See Comments)    Unknown Has patient had a PCN reaction causing immediate rash, facial/tongue/throat swelling, SOB or  lightheadedness with hypotension: Unknown Has patient had a PCN reaction causing severe rash involving mucus membranes or skin necrosis: Unknown Has patient had a PCN reaction that required hospitalization: No Has patient had a  PCN reaction occurring within the last 10 years: No If all of the above answers are "NO", then may proceed with Cephalosporin use.     VACCINATION STATUS: Immunization History  Administered Date(s) Administered  . Influenza Whole 05/15/2019  . Influenza,inj,Quad PF,6+ Mos 09/10/2018  . Pneumococcal Polysaccharide-23 09/10/2018  . Tdap 09/10/2018    Diabetes She presents for her follow-up diabetic visit. She has type 2 diabetes mellitus. Onset time: She reports that she was diagnosed at approximate age of 70 years. Her disease course has been stable. There are no hypoglycemic associated symptoms. Pertinent negatives for hypoglycemia include no confusion, headaches, pallor or seizures. Pertinent negatives for diabetes include no blurred vision, no chest pain, no fatigue, no polydipsia, no polyphagia and no polyuria. There are no hypoglycemic complications. Symptoms are stable. Diabetic complications include nephropathy. Risk factors for coronary artery disease include diabetes mellitus, dyslipidemia, family history, hypertension, sedentary lifestyle and tobacco exposure. Current diabetic treatment includes oral agent (dual therapy) (She is currently on metformin 1000 mg p.o. twice daily, glimepiride 2 mg p.o. daily.). Her weight is fluctuating minimally. She is following a generally unhealthy diet. When asked about meal planning, she reported none. She has not had a previous visit with a dietitian. She rarely participates in exercise. (She was supposed to monitor blood glucose 4 times a day.  However, she brings in a meter showing only rare and random blood glucose monitoring, averaging between 100-150.  This is in sharp discrepancy from her recent A1c of 15.4%.   She also  missed her lab appointment for repeat labs including A1c.   ) An ACE inhibitor/angiotensin II receptor blocker is being taken.  Hyperlipidemia This is a chronic problem. The current episode started more than 1 year ago. Exacerbating diseases include diabetes. Pertinent negatives include no chest pain, myalgias or shortness of breath. Current antihyperlipidemic treatment includes statins. Risk factors for coronary artery disease include family history, dyslipidemia, diabetes mellitus, hypertension, a sedentary lifestyle and post-menopausal.  Hypertension This is a chronic problem. The current episode started more than 1 year ago. The problem is uncontrolled. Pertinent negatives include no blurred vision, chest pain, headaches, palpitations or shortness of breath. Past treatments include angiotensin blockers and central alpha agonists.     Objective:    LMP 08/13/2014 (Approximate)   Wt Readings from Last 3 Encounters:  05/15/19 178 lb (80.7 kg)  01/27/19 174 lb 9.6 oz (79.2 kg)  10/31/18 177 lb (80.3 kg)     CMP ( most recent) CMP     Component Value Date/Time   NA 138 05/15/2019 1439   K 4.6 05/15/2019 1439   CL 101 05/15/2019 1439   CO2 25 05/15/2019 1439   GLUCOSE 107 (H) 05/15/2019 1439   GLUCOSE 110 (H) 07/04/2018 1007   BUN 23 05/15/2019 1439   CREATININE 1.21 (H) 05/15/2019 1439   CALCIUM 9.7 05/15/2019 1439   PROT 6.7 10/31/2018 1110   ALBUMIN 4.4 10/31/2018 1110   AST 27 10/31/2018 1110   ALT 51 (H) 10/31/2018 1110   ALKPHOS 87 10/31/2018 1110   BILITOT 0.3 10/31/2018 1110   GFRNONAA 49 (L) 05/15/2019 1439   GFRAA 57 (L) 05/15/2019 1439     Diabetic Labs (most recent): Lab Results  Component Value Date   HGBA1C 6.6 (A) 05/15/2019   HGBA1C 7.6 (A) 03/04/2019   HGBA1C 6.0 (H) 07/04/2018     Lipid Panel ( most recent) Lipid Panel     Component Value Date/Time   CHOL 206 (H) 07/04/2018  1007   CHOL 304 (H) 04/09/2017 1128   TRIG 101 07/04/2018 1007   HDL 50  07/04/2018 1007   HDL 51 04/09/2017 1128   CHOLHDL 4.1 07/04/2018 1007   VLDL 20 07/04/2018 1007   LDLCALC 136 (H) 07/04/2018 1007   LDLCALC 189 (H) 04/09/2017 1128       Assessment & Plan:   1. Type 2 diabetes mellitus with stage 3 chronic kidney disease, without long-term current use of insulin (HCC)  - Sydney Morrow has currently uncontrolled symptomatic type 2 DM since 58 years of age. -Her previsit labs are reviewed and showing stable a1c of 6.6%, generally  Improving from 15.4%.  Her renal function is improving as well.    -her diabetes is complicated by stage 3 renal insufficiency, sedentary life and she remains at extremely high risk for more acute and chronic complications which include CAD, CVA, CKD, retinopathy, and neuropathy. These are all discussed in detail with her.  - she  admits there is a room for improvement in her diet and drink choices. -  Suggestion is made for her to avoid simple carbohydrates  from her diet including Cakes, Sweet Desserts / Pastries, Ice Cream, Soda (diet and regular), Sweet Tea, Candies, Chips, Cookies, Sweet Pastries,  Store Bought Juices, Alcohol in Excess of  1-2 drinks a day, Artificial Sweeteners, Coffee Creamer, and "Sugar-free" Products. This will help patient to have stable blood glucose profile and potentially avoid unintended weight gain.   -Based on her previsit A1c of 6.6%, she will not require insulin treatment.  She will not require glimepiride either.  She is advised to continue Metformin  500 mg p.o. twice daily daily at breakfast and at supper.   - she is encouraged to call clinic for blood glucose levels less than 70 or above 300 mg /dl.  2) Lipids/HPL:   Review of her recent lipid panel showed LDL uncontrolled  at 189.  she  is advised to continue Lovastatin 20 mg p.o. nightly.  She will have fasting lipid panel on her subsequent visits.  Side effects and precautions discussed with her.  3) HTN- she is advised to home  monitor blood pressure and report if > 140/90 on 2 separate readings. She is advised to continue Metoprolol 8m po BID, Losartan-HCTZ 100-25 mg po qday, Hydralazine 25 mg po BID.  4) hypothyroidism: Her previsit thyroid function tests are consistent with appropriate replacement.  She is advised to continue levothyroxine 50 mcg p.o. every morning.  - We discussed about the correct intake of her thyroid hormone, on empty stomach at fasting, with water, separated by at least 30 minutes from breakfast and other medications,  and separated by more than 4 hours from calcium, iron, multivitamins, acid reflux medications (PPIs). -Patient is made aware of the fact that thyroid hormone replacement is needed for life, dose to be adjusted by periodic monitoring of thyroid function tests.   - I advised patient to maintain close follow up with FGildardo Pounds NP for primary care needs.  - Patient Care Time Today:  25 min, of which >50% was spent in  counseling and the rest reviewing her  current and  previous labs/studies, previous treatments, her blood glucose readings, and medications' doses and developing a plan for long-term care based on the latest recommendations for standards of care.   TSuann Larryparticipated in the discussions, expressed understanding, and voiced agreement with the above plans.  All questions were answered to her satisfaction. she  is encouraged to contact clinic should she have any questions or concerns prior to her return visit.   Follow up plan: - Return in about 6 months (around 12/10/2019) for Follow up with Pre-visit Labs, Next Visit A1c in Office.  Glade Lloyd, MD [facility] [address] [place], [place] [address] Phone: [phone]  Fax: [phone]    06/11/2019, 8:15 PM  This note was partially dictated with voice recognition software. Similar sounding words can be transcribed inadequately or may not  be  corrected upon review.

## 2019-06-11 NOTE — Progress Notes (Deleted)
   S:    Patient arrives ***.    Presents to the clinic for hypertension evaluation, counseling, and management.  Patient was referred and last seen by Primary Care Provider on 05/15/19.   Patient {Actions; denies-reports:120008} adherence with medications.  Current BP Medications include:  Amlodipine 10 mg daily, hydralazine 25 mg BID, Hyzaar 100-25 mg daily, metoprolol tartrate 50 mg BID  Antihypertensives tried in the past include: ***  Dietary habits include: *** Exercise habits include:*** Family / Social history:  - FHx: *** - Tobacco: 0.5 PPD every day smoker - Alcohol: ***  ASCVD risk factors include:***  O:  Physical Exam   ROS  Home BP readings: ***  Last 3 Office BP readings: BP Readings from Last 3 Encounters:  05/15/19 (!) 182/68  03/04/19 (!) 146/46  01/27/19 124/86    BMET    Component Value Date/Time   NA 138 05/15/2019 1439   K 4.6 05/15/2019 1439   CL 101 05/15/2019 1439   CO2 25 05/15/2019 1439   GLUCOSE 107 (H) 05/15/2019 1439   GLUCOSE 110 (H) 07/04/2018 1007   BUN 23 05/15/2019 1439   CREATININE 1.21 (H) 05/15/2019 1439   CALCIUM 9.7 05/15/2019 1439   GFRNONAA 49 (L) 05/15/2019 1439   GFRAA 57 (L) 05/15/2019 1439    Renal function: CrCl cannot be calculated (Patient's most recent lab result is older than the maximum 21 days allowed.).  Clinical ASCVD: {YES/NO:21197} The 10-year ASCVD risk score Mikey Bussing DC Jr., et al., 2013) is: 63.9%   Values used to calculate the score:     Age: 58 years     Sex: Female     Is Non-Hispanic African American: Yes     Diabetic: Yes     Tobacco smoker: Yes     Systolic Blood Pressure: [id number] mmHg     Is BP treated: Yes     HDL Cholesterol: 50 mg/dL     Total Cholesterol: 206 mg/dL   A/P: Hypertension longstanding/newly diagnosed currently *** on current medications. BP Goal = *** mmHg. Patient medications adherence ***.  -{Meds adjust:18428} ***.  -F/u labs ordered - *** -Counseled on lifestyle  modifications for blood pressure control including reduced dietary sodium, increased exercise, adequate sleep  Results reviewed and written information provided.   Total time in face-to-face counseling *** minutes.   F/U Clinic Visit in ***.  Patient seen with ***

## 2019-07-14 ENCOUNTER — Other Ambulatory Visit: Payer: Self-pay | Admitting: Nurse Practitioner

## 2019-07-22 ENCOUNTER — Other Ambulatory Visit: Payer: Self-pay | Admitting: Family Medicine

## 2019-07-22 DIAGNOSIS — M5431 Sciatica, right side: Secondary | ICD-10-CM

## 2019-08-18 ENCOUNTER — Ambulatory Visit: Payer: Self-pay | Admitting: Nurse Practitioner

## 2019-08-19 ENCOUNTER — Ambulatory Visit: Payer: Self-pay | Admitting: "Endocrinology

## 2019-09-18 ENCOUNTER — Other Ambulatory Visit: Payer: Self-pay | Admitting: Nurse Practitioner

## 2019-09-18 ENCOUNTER — Other Ambulatory Visit: Payer: Self-pay | Admitting: "Endocrinology

## 2019-09-18 DIAGNOSIS — I10 Essential (primary) hypertension: Secondary | ICD-10-CM

## 2019-10-05 ENCOUNTER — Other Ambulatory Visit: Payer: Self-pay

## 2019-10-05 ENCOUNTER — Ambulatory Visit: Payer: Self-pay | Attending: Internal Medicine

## 2019-10-05 DIAGNOSIS — Z20822 Contact with and (suspected) exposure to covid-19: Secondary | ICD-10-CM | POA: Insufficient documentation

## 2019-10-06 LAB — NOVEL CORONAVIRUS, NAA: SARS-CoV-2, NAA: NOT DETECTED

## 2019-10-14 ENCOUNTER — Encounter: Payer: Self-pay | Admitting: Nurse Practitioner

## 2019-10-14 ENCOUNTER — Other Ambulatory Visit: Payer: Self-pay

## 2019-10-14 ENCOUNTER — Ambulatory Visit: Payer: Self-pay | Attending: Nurse Practitioner | Admitting: Nurse Practitioner

## 2019-10-14 DIAGNOSIS — E782 Mixed hyperlipidemia: Secondary | ICD-10-CM

## 2019-10-14 DIAGNOSIS — K219 Gastro-esophageal reflux disease without esophagitis: Secondary | ICD-10-CM

## 2019-10-14 DIAGNOSIS — I10 Essential (primary) hypertension: Secondary | ICD-10-CM

## 2019-10-14 MED ORDER — AMLODIPINE BESYLATE 10 MG PO TABS: 10.0000 mg | ORAL_TABLET | Freq: Every day | ORAL | 0 refills | Status: DC

## 2019-10-14 MED ORDER — PANTOPRAZOLE SODIUM 20 MG PO TBEC: 20.0000 mg | DELAYED_RELEASE_TABLET | Freq: Two times a day (BID) | ORAL | 3 refills | Status: DC

## 2019-10-14 MED ORDER — ATORVASTATIN CALCIUM 40 MG PO TABS: 40.0000 mg | ORAL_TABLET | Freq: Every day | ORAL | 0 refills | Status: DC

## 2019-10-14 MED ORDER — LOSARTAN POTASSIUM-HCTZ 100-25 MG PO TABS: 1.0000 | ORAL_TABLET | Freq: Every day | ORAL | 0 refills | Status: DC

## 2019-10-14 MED ORDER — METOPROLOL TARTRATE 50 MG PO TABS: 50.0000 mg | ORAL_TABLET | Freq: Two times a day (BID) | ORAL | 0 refills | Status: DC

## 2019-10-14 NOTE — Progress Notes (Signed)
Virtual Visit via Telephone Note Due to national recommendations of social distancing due to Sydney Morrow 19, telehealth visit is felt to be most appropriate for this patient at this time.  I discussed the limitations, risks, security and privacy concerns of performing an evaluation and management service by telephone and the availability of in person appointments. I also discussed with the patient that there may be a patient responsible charge related to this service. The patient expressed understanding and agreed to proceed.    I connected with Sydney Morrow on 10/14/19  at   2:10 PM EST  EDT by telephone and verified that I am speaking with the correct person using two identifiers.   Consent I discussed the limitations, risks, security and privacy concerns of performing an evaluation and management service by telephone and the availability of in person appointments. I also discussed with the patient that there may be a patient responsible charge related to this service. The patient expressed understanding and agreed to proceed.   Location of Patient: Private Residence    Location of Provider: [place] and [employer] Office    Persons participating in Telemedicine visit: [name] FNP-BC [place]    History of Present Illness: Telemedicine visit for: Essential Hypertension Sees Dr. [name] for thyroid and DM.   has a past medical history of Acid reflux, Diabetes (Sydney Morrow), History of anemia, History of hiatal hernia, Hypercholesterolemia, Hypertension, and Jaw inflammation, right (01/30/2017).    Essential Hypertension She reports normal readings at home 130/80-90s. Taking all of her antihypertensives: amlodipine 10 mg, hydralazine 25 mg BID, losartan-HCTZ 100-25 mg daily, Lopressor 50 mg BID. She is not consistently diet adherent in regard to Sydney Morrow or exercise.  BP Readings from Last 3 Encounters:  05/15/19 (!) 182/68  03/04/19 (!) 146/46  01/27/19  124/86   Dyslipidemia LDL not at goal of <70. Taking atorvastatin 40 mg daily. Denies any statin intolerance.  Lab Results  Component Value Date   LDLCALC 136 (H) 07/04/2018   Past Medical History:  Diagnosis Date  . Acid reflux   . Diabetes (Sydney Morrow)   . History of anemia   . History of hiatal hernia   . Hypercholesterolemia   . Hypertension   . Jaw inflammation, right 01/30/2017    Past Surgical History:  Procedure Laterality Date  . COLONOSCOPY N/A 03/05/2018   Procedure: COLONOSCOPY;  Surgeon: Danie Binder, MD;  [facility];  Service: Endoscopy;  Laterality: N/A;  12:00pm  . COLONOSCOPY N/A 03/06/2018   Procedure: COLONOSCOPY;  Surgeon: Danie Binder, MD;  [facility];  Service: Endoscopy;  Laterality: N/A;  . DENTAL SURGERY    . ESOPHAGOGASTRODUODENOSCOPY  2011   white, raised, somewhat fixed plaques esophageal mucosa concerning for possible Candida esophagitis s/p brushing. Multiple gastric antral ulcerations, multiple small bulbar erosions and ulcerations.   . ESOPHAGOGASTRODUODENOSCOPY N/A 03/05/2018   Procedure: ESOPHAGOGASTRODUODENOSCOPY (EGD);  Surgeon: Danie Binder, MD;  [facility];  Service: Endoscopy;  Laterality: N/A;  . ESOPHAGOGASTRODUODENOSCOPY N/A 03/06/2018   Procedure: ESOPHAGOGASTRODUODENOSCOPY (EGD);  Surgeon: Danie Binder, MD;  [facility];  Service: Endoscopy;  Laterality: N/A;  . TOOTH EXTRACTION N/A 02/01/2017   Procedure: FULL DENTAL EXTRACTIONS;  Surgeon: Diona Browner, DDS;  Location: [place];  Service: Oral Surgery;  Laterality: N/A;  . TUBAL LIGATION      Family History  Problem Relation Age of Onset  . Breast cancer Mother   .  Colon cancer Neg Hx   . Colon polyps Neg Hx     Social History   Socioeconomic History  . Marital status: Single    Spouse name: Not on file  . Number of children: Not on file  . Years of education: Not on file  . Highest education level: Not on file  Occupational  History  . Not on file  Tobacco Use  . Smoking status: Current Every Day Smoker    Packs/day: 0.50    Years: 15.00    Pack years: 7.50    Types: Cigarettes  . Smokeless tobacco: Never Used  Substance and Sexual Activity  . Alcohol use: No  . Drug use: No  . Sexual activity: Not Currently  Other Topics Concern  . Not on file  Social History Narrative  . Not on file   Social Determinants of Health   Financial Resource Strain:   . Difficulty of Paying Living Expenses: Not on file  Food Insecurity:   . Worried About Charity fundraiser in the Last Year: Not on file  . Ran Out of Food in the Last Year: Not on file  Transportation Needs:   . Lack of Transportation (Medical): Not on file  . Lack of Transportation (Non-Medical): Not on file  Physical Activity:   . Days of Exercise per Week: Not on file  . Minutes of Exercise per Session: Not on file  Stress:   . Feeling of Stress : Not on file  Social Connections:   . Frequency of Communication with Friends and Family: Not on file  . Frequency of Social Gatherings with Friends and Family: Not on file  . Attends Religious Services: Not on file  . Active Member of Clubs or Organizations: Not on file  . Attends Archivist Meetings: Not on file  . Marital Status: Not on file     Observations/Objective: Awake, alert and oriented x 3   Review of Systems  Constitutional: Negative for fever, malaise/fatigue and weight loss.  HENT: Negative.  Negative for nosebleeds.   Eyes: Negative.  Negative for blurred vision, double vision and photophobia.  Respiratory: Negative.  Negative for cough and shortness of breath.   Cardiovascular: Negative.  Negative for chest pain, palpitations and leg swelling.  Gastrointestinal: Negative.  Negative for heartburn, nausea and vomiting.  Musculoskeletal: Negative.  Negative for myalgias.  Neurological: Negative.  Negative for dizziness, focal weakness, seizures and headaches.   Psychiatric/Behavioral: Negative.  Negative for suicidal ideas.    Assessment and Plan: [place] was seen today for medication refill.  Diagnoses and all orders for this visit:  Essential hypertension -     amLODipine (NORVASC) 10 MG tablet; Take 1 tablet (10 mg total) by mouth daily. -     losartan-hydrochlorothiazide (HYZAAR) 100-25 MG tablet; Take 1 tablet by mouth daily. -     metoprolol tartrate (LOPRESSOR) 50 MG tablet; Take 1 tablet (50 mg total) by mouth 2 (two) times daily. Continue all antihypertensives as prescribed.  Remember to bring in your blood pressure log with you for your follow up appointment.  DASH/Mediterranean Diets are healthier choices for HTN.    Mixed hyperlipidemia -     atorvastatin (LIPITOR) 40 MG tablet; Take 1 tablet (40 mg total) by mouth daily. INSTRUCTIONS: Work on a low fat, heart healthy diet and participate in regular aerobic exercise program by working out at least 150 minutes per week; 5 days a week-30 minutes per day. Avoid red meat/beef/steak,  fried  foods. junk foods, sodas, sugary drinks, unhealthy snacking, alcohol and smoking.  Drink at least 80 oz of water per day and monitor your carbohydrate intake daily.    GERD without esophagitis -     pantoprazole (PROTONIX) 20 MG tablet; Take 1 tablet (20 mg total) by mouth 2 (two) times daily before a meal. INSTRUCTIONS: Avoid GERD Triggers: acidic, spicy or fried foods, caffeine, coffee, sodas,  alcohol and chocolate.      Follow Up Instructions Return in about 2 months (around 12/21/2019) for HTN, HPL.     I discussed the assessment and treatment plan with the patient. The patient was provided an opportunity to ask questions and all were answered. The patient agreed with the plan and demonstrated an understanding of the instructions.   The patient was advised to call back or seek an in-person evaluation if the symptoms worsen or if the condition fails to improve as anticipated.  I provided 18  minutes of non-face-to-face time during this encounter including median intraservice time, reviewing previous notes, labs, imaging, medications and explaining diagnosis and management.  Gildardo Pounds, FNP-BC

## 2019-10-27 ENCOUNTER — Other Ambulatory Visit: Payer: Self-pay | Admitting: "Endocrinology

## 2019-10-27 ENCOUNTER — Other Ambulatory Visit: Payer: Self-pay | Admitting: Family Medicine

## 2019-10-27 DIAGNOSIS — I10 Essential (primary) hypertension: Secondary | ICD-10-CM

## 2019-10-28 ENCOUNTER — Other Ambulatory Visit: Payer: Self-pay

## 2019-10-28 ENCOUNTER — Ambulatory Visit: Payer: Self-pay | Attending: Nurse Practitioner

## 2019-10-28 DIAGNOSIS — E782 Mixed hyperlipidemia: Secondary | ICD-10-CM

## 2019-10-28 DIAGNOSIS — I10 Essential (primary) hypertension: Secondary | ICD-10-CM

## 2019-10-29 LAB — VITAMIN D 25 HYDROXY (VIT D DEFICIENCY, FRACTURES): Vit D, 25-Hydroxy: 95.6 ng/mL (ref 30.0–100.0)

## 2019-10-29 LAB — CBC
Hematocrit: 40.5 % (ref 34.0–46.6)
Hemoglobin: 13.9 g/dL (ref 11.1–15.9)
MCH: 31.5 pg (ref 26.6–33.0)
MCHC: 34.3 g/dL (ref 31.5–35.7)
MCV: 92 fL (ref 79–97)
Platelets: 279 10*3/uL (ref 150–450)
RBC: 4.41 x10E6/uL (ref 3.77–5.28)
RDW: 12.1 % (ref 11.7–15.4)
WBC: 6.6 10*3/uL (ref 3.4–10.8)

## 2019-10-29 LAB — CMP14+EGFR
ALT: 26 IU/L (ref 0–32)
AST: 18 IU/L (ref 0–40)
Albumin/Globulin Ratio: 1.7 (ref 1.2–2.2)
Albumin: 4.2 g/dL (ref 3.8–4.9)
Alkaline Phosphatase: 85 IU/L (ref 39–117)
BUN/Creatinine Ratio: 18 (ref 9–23)
BUN: 27 mg/dL — ABNORMAL HIGH (ref 6–24)
Bilirubin Total: 0.2 mg/dL (ref 0.0–1.2)
CO2: 22 mmol/L (ref 20–29)
Calcium: 9.9 mg/dL (ref 8.7–10.2)
Chloride: 100 mmol/L (ref 96–106)
Creatinine, Ser: 1.46 mg/dL — ABNORMAL HIGH (ref 0.57–1.00)
GFR calc Af Amer: 45 mL/min/{1.73_m2} — ABNORMAL LOW (ref >59)
GFR calc non Af Amer: 39 mL/min/{1.73_m2} — ABNORMAL LOW (ref >59)
Globulin, Total: 2.5 g/dL (ref 1.5–4.5)
Glucose: 124 mg/dL — ABNORMAL HIGH (ref 65–99)
Potassium: 4.9 mmol/L (ref 3.5–5.2)
Sodium: 137 mmol/L (ref 134–144)
Total Protein: 6.7 g/dL (ref 6.0–8.5)

## 2019-10-29 LAB — LIPID PANEL
Chol/HDL Ratio: 3.2 ratio (ref 0.0–4.4)
Cholesterol, Total: 205 mg/dL — ABNORMAL HIGH (ref 100–199)
HDL: 64 mg/dL (ref >39)
LDL Chol Calc (NIH): 114 mg/dL — ABNORMAL HIGH (ref 0–99)
Triglycerides: 155 mg/dL — ABNORMAL HIGH (ref 0–149)
VLDL Cholesterol Cal: 27 mg/dL (ref 5–40)

## 2019-11-24 ENCOUNTER — Encounter: Payer: Self-pay | Admitting: Gastroenterology

## 2019-12-10 ENCOUNTER — Encounter: Payer: Self-pay | Admitting: "Endocrinology

## 2019-12-10 ENCOUNTER — Other Ambulatory Visit: Payer: Self-pay

## 2019-12-10 ENCOUNTER — Ambulatory Visit (INDEPENDENT_AMBULATORY_CARE_PROVIDER_SITE_OTHER): Payer: Self-pay | Admitting: "Endocrinology

## 2019-12-10 VITALS — BP 169/73 | HR 91 | Ht 67.0 in | Wt 173.0 lb

## 2019-12-10 DIAGNOSIS — E039 Hypothyroidism, unspecified: Secondary | ICD-10-CM

## 2019-12-10 DIAGNOSIS — F17200 Nicotine dependence, unspecified, uncomplicated: Secondary | ICD-10-CM | POA: Insufficient documentation

## 2019-12-10 DIAGNOSIS — I10 Essential (primary) hypertension: Secondary | ICD-10-CM

## 2019-12-10 DIAGNOSIS — N1831 Chronic kidney disease, stage 3a: Secondary | ICD-10-CM

## 2019-12-10 DIAGNOSIS — E1121 Type 2 diabetes mellitus with diabetic nephropathy: Secondary | ICD-10-CM

## 2019-12-10 DIAGNOSIS — E782 Mixed hyperlipidemia: Secondary | ICD-10-CM

## 2019-12-10 LAB — HEMOGLOBIN A1C: Hemoglobin A1C: 6.9

## 2019-12-10 NOTE — Progress Notes (Signed)
12/10/2019, 12:30 PM  Endocrinology follow-up note         Subjective:    Patient ID: Sydney Morrow, female    DOB: August 25, 1960.  Sydney Morrow is engaged in follow-up for the management of  her currently controlled type 2 diabetes, hypothyroidism, hyperlipidemia.    PCP: Gildardo Pounds, NP   Past Medical History:  Diagnosis Date  . Acid reflux   . Diabetes ([place])   . History of anemia   . History of hiatal hernia   . Hypercholesterolemia   . Hypertension   . Jaw inflammation, right 01/30/2017   Past Surgical History:  Procedure Laterality Date  . COLONOSCOPY N/A 03/05/2018   Procedure: COLONOSCOPY;  Surgeon: Danie Binder, MD;  [facility];  Service: Endoscopy;  Laterality: N/A;  12:00pm  . COLONOSCOPY N/A 03/06/2018   Procedure: COLONOSCOPY;  Surgeon: Danie Binder, MD;  [facility];  Service: Endoscopy;  Laterality: N/A;  . DENTAL SURGERY    . ESOPHAGOGASTRODUODENOSCOPY  2011   white, raised, somewhat fixed plaques esophageal mucosa concerning for possible Candida esophagitis s/p brushing. Multiple gastric antral ulcerations, multiple small bulbar erosions and ulcerations.   . ESOPHAGOGASTRODUODENOSCOPY N/A 03/05/2018   Procedure: ESOPHAGOGASTRODUODENOSCOPY (EGD);  Surgeon: Danie Binder, MD;  [facility];  Service: Endoscopy;  Laterality: N/A;  . ESOPHAGOGASTRODUODENOSCOPY N/A 03/06/2018   Procedure: ESOPHAGOGASTRODUODENOSCOPY (EGD);  Surgeon: Danie Binder, MD;  [facility];  Service: Endoscopy;  Laterality: N/A;  . TOOTH EXTRACTION N/A 02/01/2017   Procedure: FULL DENTAL EXTRACTIONS;  Surgeon: Diona Browner, DDS;  Location: [place];  Service: Oral Surgery;  Laterality: N/A;  . TUBAL LIGATION     Social History   Socioeconomic History  . Marital status: Single    Spouse name: Not on file  . Number of children: Not on file  . Years of  education: Not on file  . Highest education level: Not on file  Occupational History  . Not on file  Tobacco Use  . Smoking status: Current Every Day Smoker    Packs/day: 0.50    Years: 15.00    Pack years: 7.50    Types: Cigarettes  . Smokeless tobacco: Never Used  Substance and Sexual Activity  . Alcohol use: No  . Drug use: No  . Sexual activity: Not Currently  Other Topics Concern  . Not on file  Social History Narrative  . Not on file   Social Determinants of Health   Financial Resource Strain:   . Difficulty of Paying Living Expenses:   Food Insecurity:   . Worried About Charity fundraiser in the Last Year:   . Arboriculturist in the Last Year:   Transportation Needs:   . Film/video editor (Medical):   Marland Kitchen Lack of Transportation (Non-Medical):   Physical Activity:   . Days of Exercise per Week:   . Minutes of Exercise per Session:   Stress:   . Feeling of Stress :   Social Connections:   . Frequency of Communication with Friends and Family:   . Frequency of Social  Gatherings with Friends and Family:   . Attends Religious Services:   . Active Member of Clubs or Organizations:   . Attends Archivist Meetings:   Marland Kitchen Marital Status:    Outpatient Encounter Medications as of 12/10/2019  Medication Sig  . amLODipine (NORVASC) 10 MG tablet Take 1 tablet (10 mg total) by mouth daily.  Marland Kitchen atorvastatin (LIPITOR) 40 MG tablet Take 1 tablet (40 mg total) by mouth daily.  . Blood Glucose Monitoring Suppl (TRUE METRIX METER) w/Device KIT Use as directed to check blood sugar at home.  . Coenzyme Q10 (CO Q 10 PO) Take by mouth daily.  . EUTHYROX 50 MCG tablet TAKE 1 TABLET BY MOUTH ONCE DAILY BEFORE BREAKFAST  . fluticasone (FLONASE) 50 MCG/ACT nasal spray Place 2 sprays into both nostrils daily.  Marland Kitchen glucose blood (TRUE METRIX BLOOD GLUCOSE TEST) test strip Use as instructed  . hydrALAZINE (APRESOLINE) 25 MG tablet Take 1 tablet by mouth twice daily  . loratadine  (CLARITIN) 10 MG tablet TAKE 1 TABLET BY MOUTH ONCE DAILY AS NEEDED FOR ALLERGIES  . losartan-hydrochlorothiazide (HYZAAR) 100-25 MG tablet Take 1 tablet by mouth daily.  . metFORMIN (GLUCOPHAGE-XR) 500 MG 24 hr tablet TAKE 2 TABLETS BY MOUTH ONCE DAILY WITH BREAKFAST  . methocarbamol (ROBAXIN) 750 MG tablet Take 1 tablet by mouth 4 times daily  . metoprolol tartrate (LOPRESSOR) 50 MG tablet Take 1 tablet (50 mg total) by mouth 2 (two) times daily.  . Omega-3 Fatty Acids (FISH OIL PO) Take by mouth 2 (two) times a day.  . pantoprazole (PROTONIX) 20 MG tablet Take 1 tablet (20 mg total) by mouth 2 (two) times daily before a meal.  . TRUEPLUS LANCETS 28G MISC Use as directed.  . [DISCONTINUED] Cholecalciferol (DIALYVITE VITAMIN D 5000 PO) Take by mouth daily.   No facility-administered encounter medications on file as of 12/10/2019.    ALLERGIES: Allergies  Allergen Reactions  . Aspirin Other (See Comments)    Mouth, upper lip, nose area becomes numb  . Morphine And Related Other (See Comments)    "Did not feel good"  . Penicillins Other (See Comments)    Unknown Has patient had a PCN reaction causing immediate rash, facial/tongue/throat swelling, SOB or lightheadedness with hypotension: Unknown Has patient had a PCN reaction causing severe rash involving mucus membranes or skin necrosis: Unknown Has patient had a PCN reaction that required hospitalization: No Has patient had a PCN reaction occurring within the last 10 years: No If all of the above answers are "NO", then may proceed with Cephalosporin use.     VACCINATION STATUS: Immunization History  Administered Date(s) Administered  . Influenza Whole 05/15/2019  . Influenza,inj,Quad PF,6+ Mos 09/10/2018  . Pneumococcal Polysaccharide-23 09/10/2018  . Tdap 09/10/2018    Diabetes She presents for her follow-up diabetic visit. She has type 2 diabetes mellitus. Onset time: She reports that she was diagnosed at approximate age of  52 years. Her disease course has been stable. There are no hypoglycemic associated symptoms. Pertinent negatives for hypoglycemia include no confusion, headaches, pallor or seizures. Pertinent negatives for diabetes include no blurred vision, no chest pain, no fatigue, no polydipsia, no polyphagia and no polyuria. There are no hypoglycemic complications. Symptoms are stable. Diabetic complications include nephropathy. Risk factors for coronary artery disease include diabetes mellitus, dyslipidemia, family history, hypertension, sedentary lifestyle, tobacco exposure and post-menopausal. Current diabetic treatment includes oral agent (dual therapy) (She is currently on metformin 500 mg p.o. twice daily.  ).  Her weight is fluctuating minimally. She is following a generally unhealthy diet. When asked about meal planning, she reported none. She has not had a previous visit with a dietitian. She rarely participates in exercise. (She does not monitor blood glucose regularly.  Her point-of-care A1c was found to be 6.9%, stable and overall improving from 15.4%.   ) An ACE inhibitor/angiotensin II receptor blocker is being taken.  Hyperlipidemia This is a chronic problem. The current episode started more than 1 year ago. Exacerbating diseases include diabetes. Pertinent negatives include no chest pain, myalgias or shortness of breath. Current antihyperlipidemic treatment includes statins. Risk factors for coronary artery disease include family history, dyslipidemia, diabetes mellitus, hypertension, a sedentary lifestyle and post-menopausal.  Hypertension This is a chronic problem. The current episode started more than 1 year ago. The problem is uncontrolled. Pertinent negatives include no blurred vision, chest pain, headaches, palpitations or shortness of breath. Past treatments include angiotensin blockers and central alpha agonists.    Review of systems  Constitutional: + Minimally fluctuating body weight,   current  Body mass index is 27.1 kg/m. , no fatigue, no subjective hyperthermia, no subjective hypothermia Eyes: no blurry vision, no xerophthalmia ENT: no sore throat, no nodules palpated in throat, no dysphagia/odynophagia, no hoarseness Cardiovascular: no Chest Pain, no Shortness of Breath, no palpitations, no leg swelling Respiratory: no cough, no shortness of breath Gastrointestinal: no Nausea/Vomiting/Diarhhea Musculoskeletal: no muscle/joint aches Skin: no rashes, no hyperemia Neurological: no tremors, no numbness, no tingling, no dizziness Psychiatric: no depression, no anxiety   Objective:    BP (!) 169/73   Pulse 91   Ht _0  (1.702 m)   Wt 173 lb (78.5 kg)   LMP 08/13/2014 (Approximate)   BMI 27.10 kg/m   Wt Readings from Last 3 Encounters:  12/10/19 173 lb (78.5 kg)  05/15/19 178 lb (80.7 kg)  01/27/19 174 lb 9.6 oz (79.2 kg)     Physical Exam- Limited  Constitutional:  Body mass index is 27.1 kg/m. , not in acute distress, normal state of mind Eyes:  EOMI, no exophthalmos Neck: Supple Thyroid: No gross goiter Respiratory: Adequate breathing efforts Musculoskeletal: no gross deformities, strength intact in all four extremities, no gross restriction of joint movements Skin:  no rashes, no hyperemia Neurological: no tremor with outstretched hands,   CMP ( most recent) CMP     Component Value Date/Time   NA 137 10/28/2019 1000   K 4.9 10/28/2019 1000   CL 100 10/28/2019 1000   CO2 22 10/28/2019 1000   GLUCOSE 124 (H) 10/28/2019 1000   GLUCOSE 110 (H) 07/04/2018 1007   BUN 27 (H) 10/28/2019 1000   CREATININE 1.46 (H) 10/28/2019 1000   CALCIUM 9.9 10/28/2019 1000   PROT 6.7 10/28/2019 1000   ALBUMIN 4.2 10/28/2019 1000   AST 18 10/28/2019 1000   ALT 26 10/28/2019 1000   ALKPHOS 85 10/28/2019 1000   BILITOT 0.2 10/28/2019 1000   GFRNONAA 39 (L) 10/28/2019 1000   GFRAA 45 (L) 10/28/2019 1000     Diabetic Labs (most recent): Lab Results   Component Value Date   HGBA1C 6.6 (A) 05/15/2019   HGBA1C 7.6 (A) 03/04/2019   HGBA1C 6.0 (H) 07/04/2018     Lipid Panel ( most recent) Lipid Panel     Component Value Date/Time   CHOL 205 (H) 10/28/2019 1000   TRIG 155 (H) 10/28/2019 1000   HDL 64 10/28/2019 1000   CHOLHDL 3.2 10/28/2019 1000   CHOLHDL 4.1 07/04/2018  1007   VLDL 20 07/04/2018 1007   LDLCALC 114 (H) 10/28/2019 1000       Assessment & Plan:   1. Type 2 diabetes mellitus with stage 3 chronic kidney disease, without long-term current use of insulin ([facility])  - Sydney Morrow has currently uncontrolled symptomatic type 2 DM since 59 years of age.  She does not monitor blood glucose regularly.  Her point-of-care A1c was found to be 6.9%, stable and overall improving from 15.4%.    -her diabetes is complicated by stage 3 renal insufficiency, sedentary life and she remains at extremely high risk for more acute and chronic complications which include CAD, CVA, CKD, retinopathy, and neuropathy. These are all discussed in detail with her.  - she  admits there is a room for improvement in her diet and drink choices. -  Suggestion is made for her to avoid simple carbohydrates  from her diet including Cakes, Sweet Desserts / Pastries, Ice Cream, Soda (diet and regular), Sweet Tea, Candies, Chips, Cookies, Sweet Pastries,  Store Bought Juices, Alcohol in Excess of  1-2 drinks a day, Artificial Sweeteners, Coffee Creamer, and "Sugar-free" Products. This will help patient to have stable blood glucose profile and potentially avoid unintended weight gain.   -Based on her previsit A1c of 6.9%, she will not require insulin treatment.  She is advised to continue Metformin 500 mg XR p.o. daily after breakfast with plan to return for a visit in 4 months.   - she is encouraged to call clinic for blood glucose levels less than 70 or above 300 mg /dl.  2) Lipids/HPL:   Review of her recent lipid panel showed improved LDL to 114 from 189.   She is responding to lovastatin intervention.  She is advised to continue Lovastatin 20 mg p.o. nightly.    Side effects and precautions discussed with her.  3) HTN-her blood pressure is not controlled to target.  She does have adequate medications including metoprolol 50 mg p.o. twice daily, Hyzaar 100/25 mg daily, hydralazine 25 mg p.o. twice daily.  She is a heavy smoker, advised to consider smoking cessation.   4) hypothyroidism: Her previsit labs did not include thyroid function tests.  She is advised to continue  levothyroxine 50 mcg p.o. every morning.  - We discussed about the correct intake of her thyroid hormone, on empty stomach at fasting, with water, separated by at least 30 minutes from breakfast and other medications,  and separated by more than 4 hours from calcium, iron, multivitamins, acid reflux medications (PPIs). -Patient is made aware of the fact that thyroid hormone replacement is needed for life, dose to be adjusted by periodic monitoring of thyroid function tests.   5) chronic heavy smoking: The patient was counseled on the dangers of tobacco use, and was advised to quit.  Reviewed strategies to maximize success, including removing cigarettes and smoking materials from environment.  6) weight management: Her current BMI is 27.  She is not a candidate for major weight loss.  Exercise and carbs regimen discussed in detail with her.   - I advised patient to maintain close follow up with Gildardo Pounds, NP for primary care needs.  - Time spent on this patient care encounter:  35 min, of which > 50% was spent in  counseling and the rest reviewing her blood glucose logs , discussing her hypoglycemia and hyperglycemia episodes, reviewing her current and  previous labs / studies  ( including abstraction from other facilities) and medications  doses and developing a  long term treatment plan and documenting her care.   Please refer to Patient Instructions for Blood Glucose  Monitoring and Insulin/Medications Dosing Guide"  in media tab for additional information. Please  also refer to " Patient Self Inventory" in the Media  tab for reviewed elements of pertinent patient history.  Suann Larry participated in the discussions, expressed understanding, and voiced agreement with the above plans.  All questions were answered to her satisfaction. she is encouraged to contact clinic should she have any questions or concerns prior to her return visit.   Follow up plan: - Return in about 4 months (around 04/10/2020) for Follow up with Pre-visit Labs, Next Visit A1c in Office.  Glade Lloyd, MD [facility] [address] [place], [place] [address] Phone: [phone]  Fax: [phone]    12/10/2019, 12:30 PM  This note was partially dictated with voice recognition software. Similar sounding words can be transcribed inadequately or may not  be corrected upon review.

## 2019-12-10 NOTE — Patient Instructions (Signed)

## 2019-12-25 ENCOUNTER — Ambulatory Visit: Payer: Self-pay | Admitting: Nurse Practitioner

## 2020-01-20 ENCOUNTER — Ambulatory Visit: Payer: Self-pay | Admitting: Nurse Practitioner

## 2020-02-23 ENCOUNTER — Other Ambulatory Visit: Payer: Self-pay

## 2020-02-23 ENCOUNTER — Ambulatory Visit: Payer: Self-pay | Attending: Nurse Practitioner | Admitting: Nurse Practitioner

## 2020-02-23 ENCOUNTER — Encounter: Payer: Self-pay | Admitting: Nurse Practitioner

## 2020-02-23 VITALS — BP 186/79 | HR 82 | Temp 97.7°F | Ht 67.0 in | Wt 173.0 lb

## 2020-02-23 DIAGNOSIS — K219 Gastro-esophageal reflux disease without esophagitis: Secondary | ICD-10-CM

## 2020-02-23 DIAGNOSIS — J3089 Other allergic rhinitis: Secondary | ICD-10-CM

## 2020-02-23 DIAGNOSIS — E782 Mixed hyperlipidemia: Secondary | ICD-10-CM

## 2020-02-23 DIAGNOSIS — I10 Essential (primary) hypertension: Secondary | ICD-10-CM

## 2020-02-23 DIAGNOSIS — E1169 Type 2 diabetes mellitus with other specified complication: Secondary | ICD-10-CM

## 2020-02-23 LAB — GLUCOSE, POCT (MANUAL RESULT ENTRY): POC Glucose: 157 mg/dl — AB (ref 70–99)

## 2020-02-23 MED ORDER — HYDRALAZINE HCL 25 MG PO TABS: 25.0000 mg | ORAL_TABLET | Freq: Two times a day (BID) | ORAL | 2 refills | Status: DC

## 2020-02-23 MED ORDER — PANTOPRAZOLE SODIUM 20 MG PO TBEC: 20.0000 mg | DELAYED_RELEASE_TABLET | Freq: Two times a day (BID) | ORAL | 3 refills | Status: DC

## 2020-02-23 MED ORDER — METOPROLOL TARTRATE 50 MG PO TABS: 50.0000 mg | ORAL_TABLET | Freq: Two times a day (BID) | ORAL | 0 refills | Status: DC

## 2020-02-23 MED ORDER — FLUTICASONE PROPIONATE 50 MCG/ACT NA SUSP: 2.0000 | Freq: Every day | NASAL | 1 refills | Status: DC

## 2020-02-23 MED ORDER — ATORVASTATIN CALCIUM 40 MG PO TABS: 40.0000 mg | ORAL_TABLET | Freq: Every day | ORAL | 0 refills | Status: DC

## 2020-02-23 MED ORDER — LOSARTAN POTASSIUM-HCTZ 100-25 MG PO TABS: 1.0000 | ORAL_TABLET | Freq: Every day | ORAL | 0 refills | Status: DC

## 2020-02-23 MED ORDER — AMLODIPINE BESYLATE 10 MG PO TABS: 10.0000 mg | ORAL_TABLET | Freq: Every day | ORAL | 0 refills | Status: DC

## 2020-02-23 MED FILL — hydrALAZINE HCL 25 MG TABS: 25 | 30 days supply | Qty: 60 | Fill #0

## 2020-02-23 MED FILL — FLUTICASONE PROP 50 MCG SPR: 50 | 30 days supply | Qty: 16 | Fill #0

## 2020-02-23 NOTE — Progress Notes (Signed)
Assessment & Plan:  Sydney Morrow was seen today for follow-up.  Diagnoses and all orders for this visit:  Essential hypertension -     amLODipine (NORVASC) 10 MG tablet; Take 1 tablet (10 mg total) by mouth daily. -     losartan-hydrochlorothiazide (HYZAAR) 100-25 MG tablet; Take 1 tablet by mouth daily. -     metoprolol tartrate (LOPRESSOR) 50 MG tablet; Take 1 tablet (50 mg total) by mouth 2 (two) times daily. -     CMP14+EGFR Continue all antihypertensives as prescribed.  Remember to bring in your blood pressure log with you for your follow up appointment.  DASH/Mediterranean Diets are healthier choices for HTN.    Mixed hyperlipidemia -     atorvastatin (LIPITOR) 40 MG tablet; Take 1 tablet (40 mg total) by mouth daily. INSTRUCTIONS: Work on a low fat, heart healthy diet and participate in regular aerobic exercise program by working out at least 150 minutes per week; 5 days a week-30 minutes per day. Avoid red meat/beef/steak,  fried foods. junk foods, sodas, sugary drinks, unhealthy snacking, alcohol and smoking.  Drink at least 80 oz of water per day and monitor your carbohydrate intake daily.   GERD without esophagitis -     pantoprazole (PROTONIX) 20 MG tablet; Take 1 tablet (20 mg total) by mouth 2 (two) times daily before a meal. INSTRUCTIONS: Avoid GERD Triggers: acidic, spicy or fried foods, caffeine, coffee, sodas,  alcohol and chocolate.   Allergic rhinitis due to other allergic trigger, unspecified seasonality -     fluticasone (FLONASE) 50 MCG/ACT nasal spray; Place 2 sprays into both nostrils daily.  Type 2 diabetes mellitus with other specified complication, without long-term current use of insulin (HCC) -     Glucose (CBG) Continue follow up with Dr. [name] as instructed    Patient has been counseled on age-appropriate routine health concerns for screening and prevention. These are reviewed and up-to-date. Referrals have been placed accordingly. Immunizations are  up-to-date or declined.    Subjective:   Chief Complaint  Patient presents with  . Follow-up    Pt. is here for hypertension follow up.    HPI Sydney Morrow 59 y.o. female presents to office today for HTN. She is being followed by Dr.Nida for her diabetes and hypothyroidism. Very tearful today. States she is worried about her mother whom she believes may have early Alzheimer's.  She states her brother is currently on dialysis and her mother is having to drive him to dialysis and sit in the car while he has his treatments for hours. I did recommend SCAT and for her too look into this for her brother.   Essential Hypertension Blood pressure is poorly controlled. She did not take her blood pressure medication today and states she can't remember if she took it yesterday. She has a history of non adherence. Current medications include Amlodipine 10 mg daily, Hydralazine 25 mg BID, Hyzaar 100-25 mg daily. Denies chest pain, shortness of breath, palpitations, lightheadedness, dizziness, headaches or BLE edema. I am hesitant to add on blood pressure medications as she is non compliant with her current medications.  Denies chest pain, shortness of breath, palpitations, lightheadedness, dizziness, headaches or BLE edema.  BP Readings from Last 3 Encounters:  02/23/20 (!) 186/79  12/10/19 (!) 169/73  05/15/19 (!) 182/68   Lab Results  Component Value Date   HGBA1C 6.9 12/10/2019    Review of Systems  Constitutional: Negative for fever, malaise/fatigue and weight loss.  HENT: Negative.  Negative for nosebleeds.   Eyes: Negative.  Negative for blurred vision, double vision and photophobia.  Respiratory: Negative.  Negative for cough and shortness of breath.   Cardiovascular: Negative.  Negative for chest pain, palpitations and leg swelling.  Gastrointestinal: Positive for heartburn. Negative for nausea and vomiting.  Musculoskeletal: Negative.  Negative for myalgias.  Neurological: Negative.   Negative for dizziness, focal weakness, seizures and headaches.  Endo/Heme/Allergies: Positive for environmental allergies.  Psychiatric/Behavioral: Negative.  Negative for suicidal ideas.    Past Medical History:  Diagnosis Date  . Acid reflux   . Diabetes ([place])   . History of anemia   . History of hiatal hernia   . Hypercholesterolemia   . Hypertension   . Jaw inflammation, right 01/30/2017    Past Surgical History:  Procedure Laterality Date  . COLONOSCOPY N/A 03/05/2018   Procedure: COLONOSCOPY;  Surgeon: Danie Binder, MD;  [facility];  Service: Endoscopy;  Laterality: N/A;  12:00pm  . COLONOSCOPY N/A 03/06/2018   Procedure: COLONOSCOPY;  Surgeon: Danie Binder, MD;  [facility];  Service: Endoscopy;  Laterality: N/A;  . DENTAL SURGERY    . ESOPHAGOGASTRODUODENOSCOPY  2011   white, raised, somewhat fixed plaques esophageal mucosa concerning for possible Candida esophagitis s/p brushing. Multiple gastric antral ulcerations, multiple small bulbar erosions and ulcerations.   . ESOPHAGOGASTRODUODENOSCOPY N/A 03/05/2018   Procedure: ESOPHAGOGASTRODUODENOSCOPY (EGD);  Surgeon: Danie Binder, MD;  [facility];  Service: Endoscopy;  Laterality: N/A;  . ESOPHAGOGASTRODUODENOSCOPY N/A 03/06/2018   Procedure: ESOPHAGOGASTRODUODENOSCOPY (EGD);  Surgeon: Danie Binder, MD;  [facility];  Service: Endoscopy;  Laterality: N/A;  . TOOTH EXTRACTION N/A 02/01/2017   Procedure: FULL DENTAL EXTRACTIONS;  Surgeon: Diona Browner, DDS;  Location: [facility];  Service: Oral Surgery;  Laterality: N/A;  . TUBAL LIGATION      Family History  Problem Relation Age of Onset  . Breast cancer Mother   . Colon cancer Neg Hx   . Colon polyps Neg Hx     Social History Reviewed with no changes to be made today.   Outpatient Medications Prior to Visit  Medication Sig Dispense Refill  . Blood Glucose Monitoring Suppl (TRUE METRIX METER) w/Device KIT Use as  directed to check blood sugar at home. 1 kit 0  . Coenzyme Q10 (CO Q 10 PO) Take by mouth daily.    Arna Medici 50 MCG tablet TAKE 1 TABLET BY MOUTH ONCE DAILY BEFORE BREAKFAST 90 tablet 0  . glucose blood (TRUE METRIX BLOOD GLUCOSE TEST) test strip Use as instructed 100 each 12  . hydrALAZINE (APRESOLINE) 25 MG tablet Take 1 tablet by mouth twice daily 180 tablet 0  . loratadine (CLARITIN) 10 MG tablet TAKE 1 TABLET BY MOUTH ONCE DAILY AS NEEDED FOR ALLERGIES 30 tablet 0  . metFORMIN (GLUCOPHAGE-XR) 500 MG 24 hr tablet TAKE 2 TABLETS BY MOUTH ONCE DAILY WITH BREAKFAST 180 tablet 0  . methocarbamol (ROBAXIN) 750 MG tablet Take 1 tablet by mouth 4 times daily 120 tablet 0  . Omega-3 Fatty Acids (FISH OIL PO) Take by mouth 2 (two) times a day.    . TRUEPLUS LANCETS 28G MISC Use as directed. 100 each 11  . amLODipine (NORVASC) 10 MG tablet Take 1 tablet (10 mg total) by mouth daily. 90 tablet 0  . atorvastatin (LIPITOR) 40 MG tablet Take 1 tablet (40 mg total) by mouth daily. 90 tablet 0  . fluticasone (FLONASE) 50 MCG/ACT  nasal spray Place 2 sprays into both nostrils daily. 16 g 1  . losartan-hydrochlorothiazide (HYZAAR) 100-25 MG tablet Take 1 tablet by mouth daily. 90 tablet 0  . metoprolol tartrate (LOPRESSOR) 50 MG tablet Take 1 tablet (50 mg total) by mouth 2 (two) times daily. 180 tablet 0  . pantoprazole (PROTONIX) 20 MG tablet Take 1 tablet (20 mg total) by mouth 2 (two) times daily before a meal. 180 tablet 3   No facility-administered medications prior to visit.    Allergies  Allergen Reactions  . Aspirin Other (See Comments)    Mouth, upper lip, nose area becomes numb  . Morphine And Related Other (See Comments)    "Did not feel good"  . Other     Cats  . Peach Flavor     Mouth and body itches.   . Penicillins Other (See Comments)    Unknown Has patient had a PCN reaction causing immediate rash, facial/tongue/throat swelling, SOB or lightheadedness with hypotension:  Unknown Has patient had a PCN reaction causing severe rash involving mucus membranes or skin necrosis: Unknown Has patient had a PCN reaction that required hospitalization: No Has patient had a PCN reaction occurring within the last 10 years: No If all of the above answers are "NO", then may proceed with Cephalosporin use.        Objective:    BP (!) 186/79 (BP Location: Left Arm, Patient Position: Sitting, Cuff Size: Normal)   Pulse 82   Temp 97.7 F (36.5 C) (Temporal)   Ht _0  (1.702 m)   Wt 173 lb (78.5 kg)   LMP 08/13/2014 (Approximate)   SpO2 98%   BMI 27.10 kg/m  Wt Readings from Last 3 Encounters:  02/23/20 173 lb (78.5 kg)  12/10/19 173 lb (78.5 kg)  05/15/19 178 lb (80.7 kg)    Physical Exam Vitals and nursing note reviewed.  Constitutional:      Appearance: She is well-developed.  HENT:     Head: Normocephalic and atraumatic.  Cardiovascular:     Rate and Rhythm: Normal rate and regular rhythm.     Heart sounds: Normal heart sounds. No murmur heard.  No friction rub. No gallop.   Pulmonary:     Effort: Pulmonary effort is normal. No tachypnea or respiratory distress.     Breath sounds: Normal breath sounds. No decreased breath sounds, wheezing, rhonchi or rales.  Chest:     Chest wall: No tenderness.  Abdominal:     General: Bowel sounds are normal.     Palpations: Abdomen is soft.  Musculoskeletal:        General: Normal range of motion.     Cervical back: Normal range of motion.  Skin:    General: Skin is warm and dry.  Neurological:     Mental Status: She is alert and oriented to person, place, and time.     Coordination: Coordination normal.  Psychiatric:        Behavior: Behavior normal. Behavior is cooperative.        Thought Content: Thought content normal.        Judgment: Judgment normal.          Patient has been counseled extensively about nutrition and exercise as well as the importance of adherence with medications and regular  follow-up. The patient was given clear instructions to go to ER or return to medical center if symptoms don't improve, worsen or new problems develop. The patient verbalized understanding.   Follow-up: Return in  about 3 months (around 05/25/2020).   Gildardo Pounds, FNP-BC [facility] and [place], [place]   02/23/2020, 12:43 PM

## 2020-02-24 LAB — CMP14+EGFR
ALT: 22 IU/L (ref 0–32)
AST: 18 IU/L (ref 0–40)
Albumin/Globulin Ratio: 1.7 (ref 1.2–2.2)
Albumin: 4.1 g/dL (ref 3.8–4.9)
Alkaline Phosphatase: 104 IU/L (ref 48–121)
BUN/Creatinine Ratio: 17 (ref 9–23)
BUN: 25 mg/dL — ABNORMAL HIGH (ref 6–24)
Bilirubin Total: 0.2 mg/dL (ref 0.0–1.2)
CO2: 22 mmol/L (ref 20–29)
Calcium: 9.6 mg/dL (ref 8.7–10.2)
Chloride: 101 mmol/L (ref 96–106)
Creatinine, Ser: 1.49 mg/dL — ABNORMAL HIGH (ref 0.57–1.00)
GFR calc Af Amer: 44 mL/min/{1.73_m2} — ABNORMAL LOW (ref >59)
GFR calc non Af Amer: 38 mL/min/{1.73_m2} — ABNORMAL LOW (ref >59)
Globulin, Total: 2.4 g/dL (ref 1.5–4.5)
Glucose: 124 mg/dL — ABNORMAL HIGH (ref 65–99)
Potassium: 4.8 mmol/L (ref 3.5–5.2)
Sodium: 137 mmol/L (ref 134–144)
Total Protein: 6.5 g/dL (ref 6.0–8.5)

## 2020-03-04 ENCOUNTER — Telehealth: Payer: Self-pay | Admitting: Nurse Practitioner

## 2020-03-04 NOTE — Telephone Encounter (Signed)
Will route to PCP 

## 2020-03-04 NOTE — Telephone Encounter (Signed)
Copied from [place] [id number]. Topic: Conservator, museum/gallery Patient (Clinic Use ONLY) >> Mar 03, 2020  4:48 PM Sydney Morrow, [place] wrote: Reason for CRM: Attempt to reach patient to inform that COVID vaccine is up to patient's preference.  As health care provider we recommended the get COVID vaccine, but it is up to patient's preference.  If patient call back, please inform. >> Mar 04, 2020  8:55 AM Sydney Morrow wrote: Pt does want to get the vaccine.  But she would like to know WHICH vaccine would be best for her to take?  Pfizer, moderna, or the J&J. Pt states since she is on so many meds, she prefers her dr advise her which vaccine best for her and compatible with her medications.

## 2020-04-26 ENCOUNTER — Ambulatory Visit: Payer: Self-pay | Admitting: Nurse Practitioner

## 2020-05-19 ENCOUNTER — Other Ambulatory Visit: Payer: Self-pay | Admitting: Nurse Practitioner

## 2020-05-19 ENCOUNTER — Other Ambulatory Visit: Payer: Self-pay | Admitting: "Endocrinology

## 2020-05-19 NOTE — Telephone Encounter (Signed)
Requested medication (s) are due for refill today: yes  Requested medication (s) are on the active medication list: yes  Last refill:  10/28/19  Future visit scheduled:  yes  Notes to clinic:  historical provider    Requested Prescriptions  Pending Prescriptions Disp Refills   levothyroxine (EUTHYROX) 50 MCG tablet 90 tablet 0    Sig: TAKE 1 TABLET BY MOUTH ONCE DAILY BEFORE BREAKFAST      Endocrinology:  Hypothyroid Agents Failed - 05/19/2020  2:33 PM      Failed - TSH needs to be rechecked within 3 months after an abnormal result. Refill until TSH is due.      Failed - TSH in normal range and within 360 days    TSH  Date Value Ref Range Status  05/15/2019 0.607 0.450 - 4.500 uIU/mL Final          Passed - Valid encounter within last 12 months    Recent Outpatient Visits           2 months ago Essential hypertension   [place], Vernia Buff, NP   7 months ago Essential hypertension   [place], Vernia Buff, NP   1 year ago Need for influenza vaccination   [place], RPH-CPP   1 year ago Type 2 diabetes mellitus with other specified complication, without long-term current use of insulin [facility])   [place], Vernia Buff, NP   1 year ago Essential hypertension   [place], Vernia Buff, NP       Future Appointments             In 6 days Gildardo Pounds, NP [place]

## 2020-05-19 NOTE — Telephone Encounter (Signed)
Patient requesting her synthroid medication and states she does not know the name of medication. Patient states she contacted her pharmacy and was advised to call PCP office .  Patient states she is completely out and would like a follow up call today regarding request being expedited.    [place], [place] - [id number] [place] [address] [place] Phone:  [phone]  Fax:  [phone]

## 2020-05-25 ENCOUNTER — Ambulatory Visit: Payer: Self-pay | Attending: Nurse Practitioner | Admitting: Nurse Practitioner

## 2020-05-25 ENCOUNTER — Encounter: Payer: Self-pay | Admitting: Nurse Practitioner

## 2020-05-25 ENCOUNTER — Other Ambulatory Visit: Payer: Self-pay

## 2020-05-25 ENCOUNTER — Telehealth: Payer: Self-pay | Admitting: Nurse Practitioner

## 2020-05-25 DIAGNOSIS — I10 Essential (primary) hypertension: Secondary | ICD-10-CM

## 2020-05-25 DIAGNOSIS — E782 Mixed hyperlipidemia: Secondary | ICD-10-CM

## 2020-05-25 NOTE — Telephone Encounter (Signed)
Copied from [place] [id number]. Topic: General - Other >> May 25, 2020  1:35 PM Leward Quan A wrote: Reason for CRM: Patient called to say that she was on hold for a significantly long time waiting for [name] to do her virtual visit. Patient is asking for a call back please at Ph# [phone]

## 2020-05-25 NOTE — Progress Notes (Signed)
Virtual Visit via Telephone Note Due to national recommendations of social distancing due to [place] 19, telehealth visit is felt to be most appropriate for this patient at this time.  I discussed the limitations, risks, security and privacy concerns of performing an evaluation and management service by telephone and the availability of in person appointments. I also discussed with the patient that there may be a patient responsible charge related to this service. The patient expressed understanding and agreed to proceed.    I connected with Sydney Morrow on 05/25/20  at  11:10 AM EDT  EDT by telephone and verified that I am speaking with the correct person using two identifiers.   Consent I discussed the limitations, risks, security and privacy concerns of performing an evaluation and management service by telephone and the availability of in person appointments. I also discussed with the patient that there may be a patient responsible charge related to this service. The patient expressed understanding and agreed to proceed.   Location of Patient: Private Residence    Location of Provider: [place] and [employer] Office    Persons participating in Telemedicine visit: [name] FNP-BC [name]    History of Present Illness: Telemedicine visit for: F/U  has a past medical history of Acid reflux, Diabetes ([place]), History of anemia, History of hiatal hernia, Hypercholesterolemia, Hypertension, and Jaw inflammation, right (01/30/2017).   She sees Endocrinology for her DM and Thyroid disorder  Essential Hypertension She endorses medication adherence however blood pressure is poorly controlled. She states last reading at home was149/86. I requested she check her blood pressure during televisit however she states she is not at her home at this time. Checking blood pressure at home 3-4 times per week per her report. Current medications include: amlodipine 10 mg  daily, hydralazine 25 mg BID, hyzaar 100-25 mg daily and lopressor 50 mg BID. Denies chest pain, shortness of breath, palpitations, lightheadedness, dizziness, headaches or BLE edema.  BP Readings from Last 3 Encounters:  02/23/20 (!) 186/79  12/10/19 (!) 169/73  05/15/19 (!) 182/68    Hypertriglyceridemia Taking atorvastatin 40 mg daily and omega 3 two capsules BID however cholesterol levels are not at goal. She denies any statin intolerance or myalgias.  Lab Results  Component Value Date   CHOL 205 (H) 10/28/2019   CHOL 206 (H) 07/04/2018   CHOL 304 (H) 04/09/2017   Lab Results  Component Value Date   HDL 64 10/28/2019   HDL 50 07/04/2018   HDL 51 04/09/2017   Lab Results  Component Value Date   LDLCALC 114 (H) 10/28/2019   LDLCALC 136 (H) 07/04/2018   LDLCALC 189 (H) 04/09/2017   Lab Results  Component Value Date   TRIG 155 (H) 10/28/2019   TRIG 101 07/04/2018   TRIG 319 (H) 04/09/2017   Lab Results  Component Value Date   CHOLHDL 3.2 10/28/2019   CHOLHDL 4.1 07/04/2018   CHOLHDL 6.0 (H) 04/09/2017   Past Medical History:  Diagnosis Date  . Acid reflux   . Diabetes ([place])   . History of anemia   . History of hiatal hernia   . Hypercholesterolemia   . Hypertension   . Jaw inflammation, right 01/30/2017    Past Surgical History:  Procedure Laterality Date  . COLONOSCOPY N/A 03/05/2018   Procedure: COLONOSCOPY;  Surgeon: Danie Binder, MD;  [facility];  Service: Endoscopy;  Laterality: N/A;  12:00pm  . COLONOSCOPY N/A 03/06/2018  Procedure: COLONOSCOPY;  Surgeon: Danie Binder, MD;  [facility];  Service: Endoscopy;  Laterality: N/A;  . DENTAL SURGERY    . ESOPHAGOGASTRODUODENOSCOPY  2011   white, raised, somewhat fixed plaques esophageal mucosa concerning for possible Candida esophagitis s/p brushing. Multiple gastric antral ulcerations, multiple small bulbar erosions and ulcerations.   . ESOPHAGOGASTRODUODENOSCOPY N/A 03/05/2018    Procedure: ESOPHAGOGASTRODUODENOSCOPY (EGD);  Surgeon: Danie Binder, MD;  [facility];  Service: Endoscopy;  Laterality: N/A;  . ESOPHAGOGASTRODUODENOSCOPY N/A 03/06/2018   Procedure: ESOPHAGOGASTRODUODENOSCOPY (EGD);  Surgeon: Danie Binder, MD;  [facility];  Service: Endoscopy;  Laterality: N/A;  . TOOTH EXTRACTION N/A 02/01/2017   Procedure: FULL DENTAL EXTRACTIONS;  Surgeon: Diona Browner, DDS;  Location: [facility];  Service: Oral Surgery;  Laterality: N/A;  . TUBAL LIGATION      Family History  Problem Relation Age of Onset  . Breast cancer Mother   . Colon cancer Neg Hx   . Colon polyps Neg Hx     Social History   Socioeconomic History  . Marital status: Single    Spouse name: Not on file  . Number of children: Not on file  . Years of education: Not on file  . Highest education level: Not on file  Occupational History  . Not on file  Tobacco Use  . Smoking status: Current Every Day Smoker    Packs/day: 0.50    Years: 15.00    Pack years: 7.50    Types: Cigarettes  . Smokeless tobacco: Never Used  Vaping Use  . Vaping Use: Never used  Substance and Sexual Activity  . Alcohol use: No  . Drug use: No  . Sexual activity: Not Currently  Other Topics Concern  . Not on file  Social History Narrative  . Not on file   Social Determinants of Health   Financial Resource Strain:   . Difficulty of Paying Living Expenses: Not on file  Food Insecurity:   . Worried About Charity fundraiser in the Last Year: Not on file  . Ran Out of Food in the Last Year: Not on file  Transportation Needs:   . Lack of Transportation (Medical): Not on file  . Lack of Transportation (Non-Medical): Not on file  Physical Activity:   . Days of Exercise per Week: Not on file  . Minutes of Exercise per Session: Not on file  Stress:   . Feeling of Stress : Not on file  Social Connections:   . Frequency of Communication with Friends and Family: Not on file  . Frequency  of Social Gatherings with Friends and Family: Not on file  . Attends Religious Services: Not on file  . Active Member of Clubs or Organizations: Not on file  . Attends Archivist Meetings: Not on file  . Marital Status: Not on file     Observations/Objective: Awake, alert and oriented x 3   Review of Systems  Constitutional: Negative for fever, malaise/fatigue and weight loss.  HENT: Negative.  Negative for nosebleeds.   Eyes: Negative.  Negative for blurred vision, double vision and photophobia.  Respiratory: Negative.  Negative for cough and shortness of breath.   Cardiovascular: Negative.  Negative for chest pain, palpitations and leg swelling.  Gastrointestinal: Negative.  Negative for heartburn, nausea and vomiting.  Musculoskeletal: Negative.  Negative for myalgias.  Neurological: Negative.  Negative for dizziness, focal weakness, seizures and headaches.  Psychiatric/Behavioral: Negative.  Negative for suicidal  ideas.    Assessment and Plan: Sydney Morrow was seen today for follow-up.  Diagnoses and all orders for this visit:  Essential hypertension Continue all antihypertensives as prescribed.  Remember to bring in your blood pressure log with you for your follow up appointment.  DASH/Mediterranean Diets are healthier choices for HTN.    Mixed hyperlipidemia INSTRUCTIONS: Work on a low fat, heart healthy diet and participate in regular aerobic exercise program by working out at least 150 minutes per week; 5 days a week-30 minutes per day. Avoid red meat/beef/steak,  fried foods. junk foods, sodas, sugary drinks, unhealthy snacking, alcohol and smoking.  Drink at least 80 oz of water per day and monitor your carbohydrate intake daily.      Follow Up Instructions Return in about 3 months (around 08/25/2020).     I discussed the assessment and treatment plan with the patient. The patient was provided an opportunity to ask questions and all were answered. The patient  agreed with the plan and demonstrated an understanding of the instructions.   The patient was advised to call back or seek an in-person evaluation if the symptoms worsen or if the condition fails to improve as anticipated.  I provided 16 minutes of non-face-to-face time during this encounter including median intraservice time, reviewing previous notes, labs, imaging, medications and explaining diagnosis and management.  Gildardo Pounds, FNP-BC

## 2020-05-26 ENCOUNTER — Encounter: Payer: Self-pay | Admitting: Nurse Practitioner

## 2020-05-27 NOTE — Telephone Encounter (Signed)
Patient had a televisit w/ PCP, her concerns were addressed.

## 2020-06-01 ENCOUNTER — Ambulatory Visit: Payer: Self-pay | Admitting: Nurse Practitioner

## 2020-06-03 ENCOUNTER — Other Ambulatory Visit: Payer: Self-pay

## 2020-06-03 ENCOUNTER — Encounter: Payer: Self-pay | Admitting: Nurse Practitioner

## 2020-06-03 ENCOUNTER — Ambulatory Visit (INDEPENDENT_AMBULATORY_CARE_PROVIDER_SITE_OTHER): Payer: Self-pay | Admitting: Nurse Practitioner

## 2020-06-03 VITALS — BP 187/79 | HR 86 | Ht 67.0 in | Wt 173.4 lb

## 2020-06-03 DIAGNOSIS — E782 Mixed hyperlipidemia: Secondary | ICD-10-CM

## 2020-06-03 DIAGNOSIS — N1832 Chronic kidney disease, stage 3b: Secondary | ICD-10-CM

## 2020-06-03 DIAGNOSIS — E1122 Type 2 diabetes mellitus with diabetic chronic kidney disease: Secondary | ICD-10-CM

## 2020-06-03 DIAGNOSIS — I10 Essential (primary) hypertension: Secondary | ICD-10-CM

## 2020-06-03 DIAGNOSIS — E039 Hypothyroidism, unspecified: Secondary | ICD-10-CM

## 2020-06-03 LAB — COMPLETE METABOLIC PANEL WITH GFR
AG Ratio: 1.5 (calc) (ref 1.0–2.5)
ALT: 16 U/L (ref 6–29)
AST: 15 U/L (ref 10–35)
Albumin: 4 g/dL (ref 3.6–5.1)
Alkaline phosphatase (APISO): 83 U/L (ref 37–153)
BUN/Creatinine Ratio: 18 (calc) (ref 6–22)
BUN: 36 mg/dL — ABNORMAL HIGH (ref 7–25)
CO2: 23 mmol/L (ref 20–32)
Calcium: 9.1 mg/dL (ref 8.6–10.4)
Chloride: 105 mmol/L (ref 98–110)
Creat: 2 mg/dL — ABNORMAL HIGH (ref 0.50–0.99)
GFR, Est African American: 31 mL/min/{1.73_m2} — ABNORMAL LOW (ref >=60)
GFR, Est Non African American: 26 mL/min/{1.73_m2} — ABNORMAL LOW (ref >=60)
Globulin: 2.6 g/dL (calc) (ref 1.9–3.7)
Glucose, Bld: 99 mg/dL (ref 65–99)
Potassium: 4.4 mmol/L (ref 3.5–5.3)
Sodium: 138 mmol/L (ref 135–146)
Total Bilirubin: 0.2 mg/dL (ref 0.2–1.2)
Total Protein: 6.6 g/dL (ref 6.1–8.1)

## 2020-06-03 LAB — MICROALBUMIN / CREATININE URINE RATIO
Creatinine, Urine: 68 mg/dL (ref 20–275)
Microalb Creat Ratio: 2037 mcg/mg creat — ABNORMAL HIGH (ref <30)
Microalb, Ur: 138.5 mg/dL

## 2020-06-03 LAB — TSH: TSH: 2.61 mIU/L (ref 0.40–4.50)

## 2020-06-03 LAB — VITAMIN D 25 HYDROXY (VIT D DEFICIENCY, FRACTURES): Vit D, 25-Hydroxy: 43 ng/mL (ref 30–100)

## 2020-06-03 LAB — POCT GLYCOSYLATED HEMOGLOBIN (HGB A1C): Hemoglobin A1C: 6.7 % — AB (ref 4.0–5.6)

## 2020-06-03 LAB — T4, FREE: Free T4: 1 ng/dL (ref 0.8–1.8)

## 2020-06-03 MED ORDER — LEVOTHYROXINE SODIUM 50 MCG PO TABS: ORAL_TABLET | ORAL | 3 refills | Status: DC

## 2020-06-03 MED ORDER — GLIPIZIDE ER 5 MG PO TB24
5.0000 mg | ORAL_TABLET | Freq: Every day | ORAL | 3 refills | Status: DC
Start: 2020-06-03 — End: 2021-02-20

## 2020-06-03 NOTE — Patient Instructions (Signed)

## 2020-06-03 NOTE — Progress Notes (Signed)
06/03/2020, 10:00 AM  Endocrinology follow-up note         Subjective:    Patient ID: Sydney Morrow, female    DOB: 01-24-1961.  Sydney Morrow is engaged in follow-up for the management of  her currently controlled type 2 diabetes, hypothyroidism, hyperlipidemia.    PCP: Sydney Pounds, NP   Past Medical History:  Diagnosis Date  . Acid reflux   . Diabetes ([place])   . History of anemia   . History of hiatal hernia   . Hypercholesterolemia   . Hypertension   . Jaw inflammation, right 01/30/2017   Past Surgical History:  Procedure Laterality Date  . COLONOSCOPY N/A 03/05/2018   Procedure: COLONOSCOPY;  Surgeon: Danie Binder, MD;  [facility];  Service: Endoscopy;  Laterality: N/A;  12:00pm  . COLONOSCOPY N/A 03/06/2018   Procedure: COLONOSCOPY;  Surgeon: Danie Binder, MD;  [facility];  Service: Endoscopy;  Laterality: N/A;  . DENTAL SURGERY    . ESOPHAGOGASTRODUODENOSCOPY  2011   white, raised, somewhat fixed plaques esophageal mucosa concerning for possible Candida esophagitis s/p brushing. Multiple gastric antral ulcerations, multiple small bulbar erosions and ulcerations.   . ESOPHAGOGASTRODUODENOSCOPY N/A 03/05/2018   Procedure: ESOPHAGOGASTRODUODENOSCOPY (EGD);  Surgeon: Danie Binder, MD;  [facility];  Service: Endoscopy;  Laterality: N/A;  . ESOPHAGOGASTRODUODENOSCOPY N/A 03/06/2018   Procedure: ESOPHAGOGASTRODUODENOSCOPY (EGD);  Surgeon: Danie Binder, MD;  [facility];  Service: Endoscopy;  Laterality: N/A;  . TOOTH EXTRACTION N/A 02/01/2017   Procedure: FULL DENTAL EXTRACTIONS;  Surgeon: Diona Browner, DDS;  Location: [place];  Service: Oral Surgery;  Laterality: N/A;  . TUBAL LIGATION     Social History   Socioeconomic History  . Marital status: Single    Spouse name: Not on file  . Number of children: Not on file  . Years of  education: Not on file  . Highest education level: Not on file  Occupational History  . Not on file  Tobacco Use  . Smoking status: Current Every Day Smoker    Packs/day: 0.50    Years: 15.00    Pack years: 7.50    Types: Cigarettes  . Smokeless tobacco: Never Used  Vaping Use  . Vaping Use: Never used  Substance and Sexual Activity  . Alcohol use: No  . Drug use: No  . Sexual activity: Not Currently  Other Topics Concern  . Not on file  Social History Narrative  . Not on file   Social Determinants of Health   Financial Resource Strain:   . Difficulty of Paying Living Expenses: Not on file  Food Insecurity:   . Worried About Charity fundraiser in the Last Year: Not on file  . Ran Out of Food in the Last Year: Not on file  Transportation Needs:   . Lack of Transportation (Medical): Not on file  . Lack of Transportation (Non-Medical): Not on file  Physical Activity:   . Days of Exercise per Week: Not on file  . Minutes of Exercise per Session: Not on file  Stress:   . Feeling  of Stress : Not on file  Social Connections:   . Frequency of Communication with Friends and Family: Not on file  . Frequency of Social Gatherings with Friends and Family: Not on file  . Attends Religious Services: Not on file  . Active Member of Clubs or Organizations: Not on file  . Attends Archivist Meetings: Not on file  . Marital Status: Not on file   Outpatient Encounter Medications as of 06/03/2020  Medication Sig  . amLODipine (NORVASC) 10 MG tablet Take 1 tablet (10 mg total) by mouth daily.  Marland Kitchen atorvastatin (LIPITOR) 40 MG tablet Take 1 tablet (40 mg total) by mouth daily.  . Blood Glucose Monitoring Suppl (TRUE METRIX METER) w/Device KIT Use as directed to check blood sugar at home.  . Coenzyme Q10 (CO Q 10 PO) Take by mouth daily.  . EUTHYROX 50 MCG tablet TAKE 1 TABLET BY MOUTH ONCE DAILY BEFORE BREAKFAST  . fluticasone (FLONASE) 50 MCG/ACT nasal spray Place 2 sprays into  both nostrils daily.  Marland Kitchen glucose blood (TRUE METRIX BLOOD GLUCOSE TEST) test strip Use as instructed  . hydrALAZINE (APRESOLINE) 25 MG tablet Take 1 tablet (25 mg total) by mouth 2 (two) times daily.  Marland Kitchen loratadine (CLARITIN) 10 MG tablet TAKE 1 TABLET BY MOUTH ONCE DAILY AS NEEDED FOR ALLERGIES  . losartan-hydrochlorothiazide (HYZAAR) 100-25 MG tablet Take 1 tablet by mouth daily.  . methocarbamol (ROBAXIN) 750 MG tablet Take 1 tablet by mouth 4 times daily  . metoprolol tartrate (LOPRESSOR) 50 MG tablet Take 1 tablet (50 mg total) by mouth 2 (two) times daily.  . Omega-3 Fatty Acids (FISH OIL PO) Take by mouth 2 (two) times a day.  . pantoprazole (PROTONIX) 20 MG tablet Take 1 tablet (20 mg total) by mouth 2 (two) times daily before a meal.  . TRUEPLUS LANCETS 28G MISC Use as directed.  . [DISCONTINUED] metFORMIN (GLUCOPHAGE-XR) 500 MG 24 hr tablet TAKE 2 TABLETS BY MOUTH ONCE DAILY WITH BREAKFAST  . glipiZIDE (GLUCOTROL XL) 5 MG 24 hr tablet Take 1 tablet (5 mg total) by mouth daily with breakfast.   No facility-administered encounter medications on file as of 06/03/2020.    ALLERGIES: Allergies  Allergen Reactions  . Aspirin Other (See Comments)    Mouth, upper lip, nose area becomes numb  . Morphine And Related Other (See Comments)    "Did not feel good"  . Other     Cats  . Peach Flavor     Mouth and body itches.   . Penicillins Other (See Comments)    Unknown Has patient had a PCN reaction causing immediate rash, facial/tongue/throat swelling, SOB or lightheadedness with hypotension: Unknown Has patient had a PCN reaction causing severe rash involving mucus membranes or skin necrosis: Unknown Has patient had a PCN reaction that required hospitalization: No Has patient had a PCN reaction occurring within the last 10 years: No If all of the above answers are "NO", then may proceed with Cephalosporin use.     VACCINATION STATUS: Immunization History  Administered Date(s)  Administered  . Influenza Whole 05/15/2019  . Influenza,inj,Quad PF,6+ Mos 09/10/2018  . Pneumococcal Polysaccharide-23 09/10/2018  . Tdap 09/10/2018    Diabetes She presents for her follow-up diabetic visit. She has type 2 diabetes mellitus. Onset time: She reports that she was diagnosed at approximate age of 52 years. Her disease course has been stable. There are no hypoglycemic associated symptoms. Pertinent negatives for hypoglycemia include no confusion, headaches, pallor or  seizures. Pertinent negatives for diabetes include no blurred vision, no chest pain, no fatigue, no polydipsia, no polyphagia and no polyuria. There are no hypoglycemic complications. Symptoms are stable. Diabetic complications include nephropathy. Risk factors for coronary artery disease include diabetes mellitus, dyslipidemia, family history, hypertension, sedentary lifestyle, tobacco exposure and post-menopausal. Current diabetic treatment includes oral agent (monotherapy). She is compliant with treatment most of the time. Her weight is fluctuating minimally. She is following a generally unhealthy diet. When asked about meal planning, she reported none. She has not had a previous visit with a dietitian. She rarely participates in exercise. Her home blood glucose trend is fluctuating minimally. (She presents today without meter or logs to review.  She does not regularly check glucose at home due to her current medication regimen of Metformin only.  Her POCT A1C today is 6.7%, essentially unchanged from previous visit.  Her kidney function is steadily declining.  ) An ACE inhibitor/angiotensin II receptor blocker is being taken. She does not see a podiatrist.Eye exam is current.  Hyperlipidemia This is a chronic problem. The current episode started more than 1 year ago. The problem is uncontrolled. Recent lipid tests were reviewed and are high. Exacerbating diseases include chronic renal disease, diabetes and hypothyroidism.  Factors aggravating her hyperlipidemia include beta blockers, fatty foods and thiazides. Pertinent negatives include no chest pain, myalgias or shortness of breath. Current antihyperlipidemic treatment includes statins. The current treatment provides mild improvement of lipids. Compliance problems include adherence to diet and adherence to exercise.  Risk factors for coronary artery disease include family history, dyslipidemia, diabetes mellitus, hypertension, a sedentary lifestyle and post-menopausal.  Hypertension This is a chronic problem. The current episode started more than 1 year ago. The problem is unchanged. The problem is uncontrolled. Pertinent negatives include no blurred vision, chest pain, headaches, palpitations or shortness of breath. Agents associated with hypertension include thyroid hormones. Risk factors for coronary artery disease include diabetes mellitus, dyslipidemia and sedentary lifestyle. Past treatments include angiotensin blockers, central alpha agonists and diuretics. The current treatment provides no improvement. Compliance problems include exercise and diet.  Hypertensive end-organ damage includes kidney disease. Identifiable causes of hypertension include chronic renal disease.    Review of systems  Constitutional: + Minimally fluctuating body weight,  current Body mass index is 27.16 kg/m. , no fatigue, no subjective hyperthermia, no subjective hypothermia Eyes: no blurry vision, no xerophthalmia ENT: no sore throat, no nodules palpated in throat, no dysphagia/odynophagia, no hoarseness Cardiovascular: no chest pain, no shortness of breath, no palpitations, no leg swelling Respiratory: no cough, no shortness of breath Gastrointestinal: no nausea/vomiting/diarrhea Musculoskeletal: no muscle/joint aches Skin: no rashes, no hyperemia Neurological: no tremors, no numbness, no tingling, no dizziness Psychiatric: no depression, no anxiety   Objective:    BP (!)  187/79 (BP Location: Right Arm, Patient Position: Sitting)   Pulse 86   Ht _0  (1.702 m)   Wt 173 lb 6.4 oz (78.7 kg)   LMP 08/13/2014 (Approximate)   BMI 27.16 kg/m   Wt Readings from Last 3 Encounters:  06/03/20 173 lb 6.4 oz (78.7 kg)  02/23/20 173 lb (78.5 kg)  12/10/19 173 lb (78.5 kg)     BP Readings from Last 3 Encounters:  06/03/20 (!) 187/79  02/23/20 (!) 186/79  12/10/19 (!) 169/73     Physical Exam- Limited  Constitutional:  Body mass index is 27.16 kg/m. , not in acute distress, normal state of mind Eyes:  EOMI, no exophthalmos Neck: Supple Thyroid: No  gross goiter Cardiovascular: RRR, no murmers, rubs, or gallops, no edema Respiratory: Adequate breathing efforts, no crackles, rales, rhonchi, or wheezing Musculoskeletal: no gross deformities, strength intact in all four extremities, no gross restriction of joint movements Skin:  no rashes, no hyperemia Neurological: no tremor with outstretched hands   CMP ( most recent) CMP     Component Value Date/Time   NA 138 06/01/2020 1620   NA 137 02/23/2020 1130   K 4.4 06/01/2020 1620   CL 105 06/01/2020 1620   CO2 23 06/01/2020 1620   GLUCOSE 99 06/01/2020 1620   BUN 36 (H) 06/01/2020 1620   BUN 25 (H) 02/23/2020 1130   CREATININE 2.00 (H) 06/01/2020 1620   CALCIUM 9.1 06/01/2020 1620   PROT 6.6 06/01/2020 1620   PROT 6.5 02/23/2020 1130   ALBUMIN 4.1 02/23/2020 1130   AST 15 06/01/2020 1620   ALT 16 06/01/2020 1620   ALKPHOS 104 02/23/2020 1130   BILITOT 0.2 06/01/2020 1620   BILITOT 0.2 02/23/2020 1130   GFRNONAA 26 (L) 06/01/2020 1620   GFRAA 31 (L) 06/01/2020 1620     Diabetic Labs (most recent): Lab Results  Component Value Date   HGBA1C 6.7 (A) 06/03/2020   HGBA1C 6.9 12/10/2019   HGBA1C 6.6 (A) 05/15/2019     Lipid Panel ( most recent) Lipid Panel     Component Value Date/Time   CHOL 205 (H) 10/28/2019 1000   TRIG 155 (H) 10/28/2019 1000   HDL 64 10/28/2019 1000   CHOLHDL 3.2  10/28/2019 1000   CHOLHDL 4.1 07/04/2018 1007   VLDL 20 07/04/2018 1007   LDLCALC 114 (H) 10/28/2019 1000       Assessment & Plan:   1. Type 2 diabetes mellitus with stage 3 chronic kidney disease, without long-term current use of insulin (HCC)  - Sydney Morrow has currently uncontrolled symptomatic type 2 DM since 59 years of age.  She presents today without meter or logs to review.  She does not regularly check glucose at home due to her current medication regimen of Metformin only.  Her POCT A1C today is 6.7%, essentially unchanged from previous visit.  Her kidney function is steadily declining.    -her diabetes is complicated by stage 3 renal insufficiency, sedentary life, heavy smoking and she remains at extremely high risk for more acute and chronic complications which include CAD, CVA, CKD, retinopathy, and neuropathy. These are all discussed in detail with her.  - Nutritional counseling repeated at each appointment due to patients tendency to fall back in to old habits.  - The patient admits there is a room for improvement in their diet and drink choices. -  Suggestion is made for the patient to avoid simple carbohydrates from their diet including Cakes, Sweet Desserts / Pastries, Ice Cream, Soda (diet and regular), Sweet Tea, Candies, Chips, Cookies, Sweet Pastries,  Store Bought Juices, Alcohol in Excess of  1-2 drinks a day, Artificial Sweeteners, Coffee Creamer, and "Sugar-free" Products. This will help patient to have stable blood glucose profile and potentially avoid unintended weight gain.   - I encouraged the patient to switch to  unprocessed or minimally processed complex starch and increased protein intake (animal or plant source), fruits, and vegetables.   - Patient is advised to stick to a routine mealtimes to eat 3 meals  a day and avoid unnecessary snacks ( to snack only to correct hypoglycemia).  -Based on her steady decline in renal function, Metformin will be  discontinued at  this time.  She will also be referred to nephrologist for further evaluation.  -I discussed and initiated low dose Glipizide 5 mg XL daily with breakfast for diabetes management.    -She is advised to start monitoring blood glucose at least twice daily, before breakfast and before bed, and call the clinic if she has readings less than 70 or greater than 300 for 3 tests in a row.  I recommend follow up visit in 3 weeks to evaluate response to new treatment.  2) Lipids/HPL: Her most recent lipid panel from 10/28/19 shows uncontrolled LDL of 114 and elevated triglycerides of 157.  She is advised to continue Lipitor 40 mg po daily at bedtime and Fish oil supplement.  Side effects and precautions discussed with her.  3) HTN Her blood pressure is not controlled to target.  She is advised to continue Amlodipine 10 mg po daily, Hydralazine 25 mg po twice daily, Losartan-HCT 100-25 mg po daily, and Metoprolol 50 mg po twice daily.  Will refer to nephrology.  4) Hypothyroidism: Her previsit TFTs indicate appropriate thyroid hormone replacement.  She is advised to continue Levothyroxine 50 mcg po daily before breakfast.   - We discussed about the correct intake of her thyroid hormone, on empty stomach at fasting, with water, separated by at least 30 minutes from breakfast and other medications,  and separated by more than 4 hours from calcium, iron, multivitamins, acid reflux medications (PPIs). -Patient is made aware of the fact that thyroid hormone replacement is needed for life, dose to be adjusted by periodic monitoring of thyroid function tests.  5) Chronic heavy smoking: The patient was counseled on the dangers of tobacco use, and was advised to quit.  Reviewed strategies to maximize success, including removing cigarettes and smoking materials from environment.  6) Weight management:  Her Body mass index is 27.16 kg/m.  She is not a candidate for major weight loss.  Exercise and  carbs regimen discussed in detail with her.   - I advised patient to maintain close follow up with Sydney Pounds, NP for primary care needs.  - Time spent on this patient care encounter:  35 min, of which > 50% was spent in  counseling and the rest reviewing her blood glucose logs , discussing her hypoglycemia and hyperglycemia episodes, reviewing her current and  previous labs / studies  ( including abstraction from other facilities) and medications  doses and developing a  long term treatment plan and documenting her care.   Please refer to Patient Instructions for Blood Glucose Monitoring and Insulin/Medications Dosing Guide"  in media tab for additional information. Please  also refer to " Patient Self Inventory" in the Media  tab for reviewed elements of pertinent patient history.  Sydney Morrow participated in the discussions, expressed understanding, and voiced agreement with the above plans.  All questions were answered to her satisfaction. she is encouraged to contact clinic should she have any questions or concerns prior to her return visit.   Follow up plan: - Return in about 3 weeks (around 06/24/2020) for Diabetes follow up, Bring glucometer and logs.  Rayetta Pigg, [facility] [address] [place], [place] [address] Phone: [phone] Fax: [phone]  06/03/2020, 10:00 AM

## 2020-06-08 ENCOUNTER — Other Ambulatory Visit: Payer: Self-pay | Admitting: Nurse Practitioner

## 2020-06-08 ENCOUNTER — Telehealth: Payer: Self-pay | Admitting: Nurse Practitioner

## 2020-06-08 DIAGNOSIS — N1832 Chronic kidney disease, stage 3b: Secondary | ICD-10-CM

## 2020-06-08 DIAGNOSIS — E1122 Type 2 diabetes mellitus with diabetic chronic kidney disease: Secondary | ICD-10-CM

## 2020-06-08 NOTE — Telephone Encounter (Signed)
Copied from [place] [id number]. Topic: General - Inquiry >> Jun 08, 2020  4:05 PM Alease Frame wrote: Reason for CRM: Pt is requesting blood work no issues was mentioned . Pt states someone from office called her and told her she would ned blood work done. No active request in chart . Please advise

## 2020-06-08 NOTE — Progress Notes (Signed)
amb ref °

## 2020-06-09 NOTE — Telephone Encounter (Signed)
Spoke to patient. Pt. Stated she got her lab done by her Endo and wanted to know if she need any additional lab work. CMA will route to PCP for additional advise if patient needs any other lab done.

## 2020-06-10 ENCOUNTER — Telehealth: Payer: Self-pay

## 2020-06-10 NOTE — Telephone Encounter (Signed)
Telephoned patient at home number. Left a voice message with [facility] contact information. 

## 2020-06-22 ENCOUNTER — Other Ambulatory Visit: Payer: Self-pay | Admitting: Family Medicine

## 2020-06-22 DIAGNOSIS — I10 Essential (primary) hypertension: Secondary | ICD-10-CM

## 2020-06-22 NOTE — Telephone Encounter (Signed)
Pt. Had Hydralazine filled at [facility] 02/2020, and request today is from [facility].  Phone call to pt.  Spoke with daughter, Caryn Bee (on [place]).  Stated she will have her mother return call re: which pharmacy she prefers to have Hydralazine sent to.  Advised that pt. Should have refills at the [facility].  Will await pt's. Return call.

## 2020-06-22 NOTE — Telephone Encounter (Signed)
Phone call to pt.  Pt. Stated she prefers to get her medication refilled at [facility] in [place], because she lives in [place].  Advised will send refill on Hydralazine to [facility] today.  Pt. Denied any other needs at this time.

## 2020-06-22 NOTE — Telephone Encounter (Signed)
Requested Prescriptions  Pending Prescriptions Disp Refills  . hydrALAZINE (APRESOLINE) 25 MG tablet [Pharmacy Med Name: hydrALAZINE HCl 25 MG Oral Tablet] 180 tablet 0    Sig: Take 1 tablet by mouth twice daily     Cardiovascular:  Vasodilators Failed - 06/22/2020  2:02 PM      Failed - Last BP in normal range    BP Readings from Last 1 Encounters:  06/03/20 (!) 187/79         Passed - HCT in normal range and within 360 days    Hematocrit  Date Value Ref Range Status  10/28/2019 40.5 34.0 - 46.6 % Final         Passed - HGB in normal range and within 360 days    Hemoglobin  Date Value Ref Range Status  10/28/2019 13.9 11.1 - 15.9 g/dL Final         Passed - RBC in normal range and within 360 days    RBC  Date Value Ref Range Status  10/28/2019 4.41 3.77 - 5.28 x10E6/uL Final  03/06/2018 4.57 3.87 - 5.11 MIL/uL Final         Passed - WBC in normal range and within 360 days    WBC  Date Value Ref Range Status  10/28/2019 6.6 3.4 - 10.8 x10E3/uL Final  03/06/2018 6.4 4.0 - 10.5 K/uL Final         Passed - PLT in normal range and within 360 days    Platelets  Date Value Ref Range Status  10/28/2019 279 150 - 450 x10E3/uL Final         Passed - Valid encounter within last 12 months    Recent Outpatient Visits          4 weeks ago Essential hypertension   [place], Vernia Buff, NP   4 months ago Essential hypertension   [place], Vernia Buff, NP   8 months ago Essential hypertension   [place], Vernia Buff, NP   1 year ago Need for influenza vaccination   [place], Stephen L, RPH-CPP   1 year ago Type 2 diabetes mellitus with other specified complication, without long-term current use of insulin [facility])   [place], Vernia Buff, NP      Future Appointments            In 2  months Gildardo Pounds, NP [place]

## 2020-06-23 ENCOUNTER — Ambulatory Visit: Payer: Self-pay

## 2020-06-29 ENCOUNTER — Ambulatory Visit: Payer: Self-pay | Admitting: Nurse Practitioner

## 2020-07-01 ENCOUNTER — Ambulatory Visit: Payer: Self-pay

## 2020-07-06 ENCOUNTER — Ambulatory Visit: Payer: Self-pay

## 2020-07-06 ENCOUNTER — Ambulatory Visit: Payer: Self-pay | Admitting: Nurse Practitioner

## 2020-08-27 ENCOUNTER — Other Ambulatory Visit: Payer: Self-pay

## 2020-08-27 DIAGNOSIS — Z20822 Contact with and (suspected) exposure to covid-19: Secondary | ICD-10-CM

## 2020-08-29 ENCOUNTER — Encounter: Payer: Self-pay | Admitting: Nurse Practitioner

## 2020-08-29 ENCOUNTER — Ambulatory Visit: Payer: Self-pay | Attending: Nurse Practitioner | Admitting: Nurse Practitioner

## 2020-08-29 ENCOUNTER — Other Ambulatory Visit: Payer: Self-pay

## 2020-08-29 DIAGNOSIS — I10 Essential (primary) hypertension: Secondary | ICD-10-CM

## 2020-08-29 DIAGNOSIS — E782 Mixed hyperlipidemia: Secondary | ICD-10-CM

## 2020-08-29 DIAGNOSIS — Z1231 Encounter for screening mammogram for malignant neoplasm of breast: Secondary | ICD-10-CM

## 2020-08-29 DIAGNOSIS — E1169 Type 2 diabetes mellitus with other specified complication: Secondary | ICD-10-CM

## 2020-08-29 MED ORDER — AMLODIPINE BESYLATE 10 MG PO TABS: 10.0000 mg | ORAL_TABLET | Freq: Every day | ORAL | 0 refills | Status: DC

## 2020-08-29 MED ORDER — ATORVASTATIN CALCIUM 40 MG PO TABS: 40.0000 mg | ORAL_TABLET | Freq: Every day | ORAL | 0 refills | Status: DC

## 2020-08-29 MED ORDER — HYDRALAZINE HCL 25 MG PO TABS: 25.0000 mg | ORAL_TABLET | Freq: Two times a day (BID) | ORAL | 0 refills | Status: DC

## 2020-08-29 MED ORDER — LOSARTAN POTASSIUM-HCTZ 100-25 MG PO TABS: 1.0000 | ORAL_TABLET | Freq: Every day | ORAL | 0 refills | Status: DC

## 2020-08-29 MED ORDER — METOPROLOL TARTRATE 50 MG PO TABS: 50.0000 mg | ORAL_TABLET | Freq: Two times a day (BID) | ORAL | 0 refills | Status: DC

## 2020-08-29 NOTE — Progress Notes (Signed)
Virtual Visit via Telephone Note Due to national recommendations of social distancing due to [place] 19, telehealth visit is felt to be most appropriate for this patient at this time.  I discussed the limitations, risks, security and privacy concerns of performing an evaluation and management service by telephone and the availability of in person appointments. I also discussed with the patient that there may be a patient responsible charge related to this service. The patient expressed understanding and agreed to proceed.    I connected with Sydney Morrow on 08/29/20  at   4:10 PM EST  EDT by telephone and verified that I am speaking with the correct person using two identifiers.   Consent I discussed the limitations, risks, security and privacy concerns of performing an evaluation and management service by telephone and the availability of in person appointments. I also discussed with the patient that there may be a patient responsible charge related to this service. The patient expressed understanding and agreed to proceed.   Location of Patient: Private Residence   Location of Provider: [place] and [employer] Office    Persons participating in Telemedicine visit: Sydney Rankins FNP-BC [place]    History of Present Illness: Telemedicine visit for: Follow Up  has a past medical history of Acid reflux, Diabetes ([place]), History of anemia, History of hiatal hernia, Hypercholesterolemia, Hypertension, and Jaw inflammation, right (01/30/2017).  Went 3 days without smoking over the weekend but smoked 2 cigarettes today. Wants to quit.   DM 2 Well controlled. She is seeing Endocrinology for her diabetes and thyroid. She is currently taking glipizide 5 mg daily with no intolerable side effects. She is not diet or exercise adherent.  LDL not at goal. She has been prescribed atorvastatin 40 mg daily.  Lab Results  Component Value Date   HGBA1C 6.7 (A) 06/03/2020    Lab Results  Component Value Date   LDLCALC 114 (H) 10/28/2019    Essential Hypertension Poorly controlled. States "I am worrying too much. I feel good. My blood pressures are good at home". Most of her blood pressure medications have expired. She is currently prescribed amlodipine 10 mg daily, atorvastatin 40 mg daily, hydralazine 25 BID, lopressor 50 mg BID and hyzaar 100-25 mg daily. Denies chest pain, shortness of breath, palpitations, lightheadedness, dizziness, headaches or BLE edema.   BP Readings from Last 3 Encounters:  06/03/20 (!) 187/79  02/23/20 (!) 186/79  12/10/19 (!) 169/73     Past Medical History:  Diagnosis Date  . Acid reflux   . Diabetes ([place])   . History of anemia   . History of hiatal hernia   . Hypercholesterolemia   . Hypertension   . Jaw inflammation, right 01/30/2017    Past Surgical History:  Procedure Laterality Date  . COLONOSCOPY N/A 03/05/2018   Procedure: COLONOSCOPY;  Surgeon: Danie Binder, MD;  [facility];  Service: Endoscopy;  Laterality: N/A;  12:00pm  . COLONOSCOPY N/A 03/06/2018   Procedure: COLONOSCOPY;  Surgeon: Danie Binder, MD;  [facility];  Service: Endoscopy;  Laterality: N/A;  . DENTAL SURGERY    . ESOPHAGOGASTRODUODENOSCOPY  2011   white, raised, somewhat fixed plaques esophageal mucosa concerning for possible Candida esophagitis s/p brushing. Multiple gastric antral ulcerations, multiple small bulbar erosions and ulcerations.   . ESOPHAGOGASTRODUODENOSCOPY N/A 03/05/2018   Procedure: ESOPHAGOGASTRODUODENOSCOPY (EGD);  Surgeon: Danie Binder, MD;  [facility];  Service: Endoscopy;  Laterality: N/A;  .  ESOPHAGOGASTRODUODENOSCOPY N/A 03/06/2018   Procedure: ESOPHAGOGASTRODUODENOSCOPY (EGD);  Surgeon: Danie Binder, MD;  [facility];  Service: Endoscopy;  Laterality: N/A;  . TOOTH EXTRACTION N/A 02/01/2017   Procedure: FULL DENTAL EXTRACTIONS;  Surgeon: Diona Browner, DDS;   Location: [place];  Service: Oral Surgery;  Laterality: N/A;  . TUBAL LIGATION      Family History  Problem Relation Age of Onset  . Breast cancer Mother   . Colon cancer Neg Hx   . Colon polyps Neg Hx     Social History   Socioeconomic History  . Marital status: Single    Spouse name: Not on file  . Number of children: Not on file  . Years of education: Not on file  . Highest education level: Not on file  Occupational History  . Not on file  Tobacco Use  . Smoking status: Current Every Day Smoker    Packs/day: 0.50    Years: 15.00    Pack years: 7.50    Types: Cigarettes  . Smokeless tobacco: Never Used  Vaping Use  . Vaping Use: Never used  Substance and Sexual Activity  . Alcohol use: No  . Drug use: No  . Sexual activity: Not Currently  Other Topics Concern  . Not on file  Social History Narrative  . Not on file   Social Determinants of Health   Financial Resource Strain: Not on file  Food Insecurity: Not on file  Transportation Needs: Not on file  Physical Activity: Not on file  Stress: Not on file  Social Connections: Not on file     Observations/Objective: Awake, alert and oriented x 3   Review of Systems  Constitutional: Negative for fever, malaise/fatigue and weight loss.  HENT: Negative.  Negative for nosebleeds.   Eyes: Negative.  Negative for blurred vision, double vision and photophobia.  Respiratory: Negative.  Negative for cough and shortness of breath.   Cardiovascular: Negative.  Negative for chest pain, palpitations and leg swelling.  Gastrointestinal: Negative.  Negative for heartburn, nausea and vomiting.  Musculoskeletal: Negative.  Negative for myalgias.  Neurological: Negative.  Negative for dizziness, focal weakness, seizures and headaches.  Psychiatric/Behavioral: Negative.  Negative for suicidal ideas.    Assessment and Plan: Sydney Morrow was seen today for follow-up.  Diagnoses and all orders for this visit:  Essential  hypertension -     amLODipine (NORVASC) 10 MG tablet; Take 1 tablet (10 mg total) by mouth daily. -     hydrALAZINE (APRESOLINE) 25 MG tablet; Take 1 tablet (25 mg total) by mouth 2 (two) times daily. -     losartan-hydrochlorothiazide (HYZAAR) 100-25 MG tablet; Take 1 tablet by mouth daily. -     metoprolol tartrate (LOPRESSOR) 50 MG tablet; Take 1 tablet (50 mg total) by mouth 2 (two) times daily. Continue all antihypertensives as prescribed.  Remember to bring in your blood pressure log with you for your follow up appointment.  DASH/Mediterranean Diets are healthier choices for HTN.   Mixed hyperlipidemia -     atorvastatin (LIPITOR) 40 MG tablet; Take 1 tablet (40 mg total) by mouth daily. INSTRUCTIONS: Work on a low fat, heart healthy diet and participate in regular aerobic exercise program by working out at least 150 minutes per week; 5 days a week-30 minutes per day. Avoid red meat/beef/steak,  fried foods. junk foods, sodas, sugary drinks, unhealthy snacking, alcohol and smoking.  Drink at least 80 oz of water per day and monitor your carbohydrate intake daily.  Breast cancer screening by mammogram -     MM 3D SCREEN BREAST BILATERAL; Future  Type 2 diabetes mellitus with other specified complication, without long-term current use of insulin (HCC) Continue blood sugar control as discussed in office today, low carbohydrate diet, and regular physical exercise as tolerated, 150 minutes per week (30 min each day, 5 days per week, or 50 min 3 days per week). Keep blood sugar logs with fasting goal of 90-130 mg/dl, post prandial (after you eat) less than 180.  For Hypoglycemia: BS <60 and Hyperglycemia BS >400; contact the clinic ASAP. Annual eye exams and foot exams are recommended.    Follow Up Instructions Return in about 8 weeks (around 10/24/2020).     I discussed the assessment and treatment plan with the patient. The patient was provided an opportunity to ask questions and all  were answered. The patient agreed with the plan and demonstrated an understanding of the instructions.   The patient was advised to call back or seek an in-person evaluation if the symptoms worsen or if the condition fails to improve as anticipated.  I provided 15 minutes of non-face-to-face time during this encounter including median intraservice time, reviewing previous notes, labs, imaging, medications and explaining diagnosis and management.  Gildardo Pounds, FNP-BC

## 2020-08-30 LAB — NOVEL CORONAVIRUS, NAA: SARS-CoV-2, NAA: DETECTED — AB

## 2020-08-31 ENCOUNTER — Ambulatory Visit: Payer: Self-pay

## 2020-08-31 NOTE — Telephone Encounter (Signed)
Patient called and asked how long after her quarantine is she able to go receive the COVID vaccine? She says she has already received the first shot and wants to know. I advised per CDC guidelines as long as quarantine has been completed and having no symptoms, you can receive the vaccine. She has completed the 5 days in the home quarantine and is on day 1 of the 5 days wearing a form fitting mask around others. I advised she could wait until the total 10 days then schedule. She verbalized understanding.  Reason for Disposition . General information question, no triage required and triager able to answer question  Answer Assessment - Initial Assessment Questions 1. REASON FOR CALL or QUESTION: "What is your reason for calling today?" or "How can I best help you?" or "What question do you have that I can help answer?"     How long do I wait to receive my next COVID vaccine?  Protocols used: INFORMATION ONLY CALL - NO TRIAGE-A-AH

## 2020-09-13 ENCOUNTER — Other Ambulatory Visit: Payer: Self-pay

## 2020-09-16 ENCOUNTER — Other Ambulatory Visit: Payer: Self-pay

## 2020-09-16 DIAGNOSIS — Z20822 Contact with and (suspected) exposure to covid-19: Secondary | ICD-10-CM

## 2020-09-17 LAB — SARS-COV-2, NAA 2 DAY TAT

## 2020-09-17 LAB — NOVEL CORONAVIRUS, NAA: SARS-CoV-2, NAA: NOT DETECTED

## 2020-09-27 ENCOUNTER — Other Ambulatory Visit: Payer: Self-pay

## 2020-09-27 ENCOUNTER — Ambulatory Visit: Payer: Self-pay | Attending: Nurse Practitioner

## 2020-09-27 DIAGNOSIS — I10 Essential (primary) hypertension: Secondary | ICD-10-CM

## 2020-09-27 DIAGNOSIS — Z130 Encounter for screening for diseases of the blood and blood-forming organs and certain disorders involving the immune mechanism: Secondary | ICD-10-CM

## 2020-09-27 DIAGNOSIS — E782 Mixed hyperlipidemia: Secondary | ICD-10-CM

## 2020-09-27 DIAGNOSIS — E039 Hypothyroidism, unspecified: Secondary | ICD-10-CM

## 2020-09-27 DIAGNOSIS — E1169 Type 2 diabetes mellitus with other specified complication: Secondary | ICD-10-CM

## 2020-09-27 DIAGNOSIS — E079 Disorder of thyroid, unspecified: Secondary | ICD-10-CM

## 2020-09-28 LAB — BASIC METABOLIC PANEL
BUN/Creatinine Ratio: 22 (ref 12–28)
BUN: 39 mg/dL — ABNORMAL HIGH (ref 8–27)
CO2: 17 mmol/L — ABNORMAL LOW (ref 20–29)
Calcium: 9.7 mg/dL (ref 8.7–10.3)
Chloride: 102 mmol/L (ref 96–106)
Creatinine, Ser: 1.77 mg/dL — ABNORMAL HIGH (ref 0.57–1.00)
GFR calc Af Amer: 35 mL/min/{1.73_m2} — ABNORMAL LOW (ref >59)
GFR calc non Af Amer: 31 mL/min/{1.73_m2} — ABNORMAL LOW (ref >59)
Glucose: 77 mg/dL (ref 65–99)
Potassium: 4.7 mmol/L (ref 3.5–5.2)
Sodium: 139 mmol/L (ref 134–144)

## 2020-09-28 LAB — CBC
Hematocrit: 40.6 % (ref 34.0–46.6)
Hemoglobin: 13.6 g/dL (ref 11.1–15.9)
MCH: 31.8 pg (ref 26.6–33.0)
MCHC: 33.5 g/dL (ref 31.5–35.7)
MCV: 95 fL (ref 79–97)
Platelets: 247 10*3/uL (ref 150–450)
RBC: 4.28 x10E6/uL (ref 3.77–5.28)
RDW: 12.3 % (ref 11.7–15.4)
WBC: 6.9 10*3/uL (ref 3.4–10.8)

## 2020-09-28 LAB — LIPID PANEL
Chol/HDL Ratio: 3.7 ratio (ref 0.0–4.4)
Cholesterol, Total: 195 mg/dL (ref 100–199)
HDL: 53 mg/dL (ref >39)
LDL Chol Calc (NIH): 106 mg/dL — ABNORMAL HIGH (ref 0–99)
Triglycerides: 211 mg/dL — ABNORMAL HIGH (ref 0–149)
VLDL Cholesterol Cal: 36 mg/dL (ref 5–40)

## 2020-09-28 LAB — TSH: TSH: 0.989 u[IU]/mL (ref 0.450–4.500)

## 2020-09-28 LAB — HEMOGLOBIN A1C
Est. average glucose Bld gHb Est-mCnc: 128 mg/dL
Hgb A1c MFr Bld: 6.1 % — ABNORMAL HIGH (ref 4.8–5.6)

## 2020-10-07 ENCOUNTER — Ambulatory Visit: Payer: Self-pay | Admitting: Nurse Practitioner

## 2020-11-30 ENCOUNTER — Ambulatory Visit: Payer: Self-pay | Admitting: Nurse Practitioner

## 2021-01-27 ENCOUNTER — Other Ambulatory Visit: Payer: Self-pay | Admitting: Nurse Practitioner

## 2021-01-27 DIAGNOSIS — I10 Essential (primary) hypertension: Secondary | ICD-10-CM

## 2021-02-06 ENCOUNTER — Ambulatory Visit: Payer: Self-pay | Admitting: Nurse Practitioner

## 2021-02-14 ENCOUNTER — Other Ambulatory Visit: Payer: Self-pay | Admitting: Nurse Practitioner

## 2021-02-14 DIAGNOSIS — I10 Essential (primary) hypertension: Secondary | ICD-10-CM

## 2021-02-14 NOTE — Telephone Encounter (Signed)
Notes to clinic:  Patient has upcoming appt on 02/20/2021 Review for 90 day supply   Requested Prescriptions  Pending Prescriptions Disp Refills   amLODipine (NORVASC) 10 MG tablet [Pharmacy Med Name: amLODIPine Besylate 10 MG Oral Tablet] 90 tablet 0    Sig: Take 1 tablet by mouth once daily      Cardiovascular:  Calcium Channel Blockers Failed - 02/14/2021  1:33 PM      Failed - Last BP in normal range    BP Readings from Last 1 Encounters:  06/03/20 (!) 187/79          Passed - Valid encounter within last 6 months    Recent Outpatient Visits           5 months ago Essential hypertension   [place], Vernia Buff, NP   8 months ago Essential hypertension   [place], Vernia Buff, NP   11 months ago Essential hypertension   [place], [place] W, NP   1 year ago Essential hypertension   [place], [place] W, NP   1 year ago Need for influenza vaccination   [place], RPH-CPP       Future Appointments             In 6 days Gildardo Pounds, NP [place] (HYZAAR) 100-25 MG tablet [Pharmacy Med Name: Losartan Potassium-HCTZ 100-25 MG Oral Tablet] 90 tablet 0    Sig: Take 1 tablet by mouth once daily      Cardiovascular: ARB + Diuretic Combos Failed - 02/14/2021  1:33 PM      Failed - Cr in normal range and within 180 days    Creat  Date Value Ref Range Status  06/01/2020 2.00 (H) 0.50 - 0.99 mg/dL Final    Comment:    For patients >40 years of age, the reference limit for Creatinine is approximately 13% higher for people identified as African-American. .    Creatinine, Ser  Date Value Ref Range Status  09/27/2020 1.77 (H) 0.57 - 1.00 mg/dL Final   Creatinine, Urine  Date Value Ref  Range Status  06/01/2020 68 20 - 275 mg/dL Final          Failed - Last BP in normal range    BP Readings from Last 1 Encounters:  06/03/20 (!) 187/79          Passed - K in normal range and within 180 days    Potassium  Date Value Ref Range Status  09/27/2020 4.7 3.5 - 5.2 mmol/L Final          Passed - Na in normal range and within 180 days    Sodium  Date Value Ref Range Status  09/27/2020 139 134 - 144 mmol/L Final          Passed - Ca in normal range and within 180 days    Calcium  Date Value Ref Range Status  09/27/2020 9.7 8.7 - 10.3 mg/dL Final          Passed - Patient is not pregnant      Passed - Valid encounter within last 6 months    Recent Outpatient Visits  5 months ago Essential hypertension   [place], Vernia Buff, NP   8 months ago Essential hypertension   [place] Gildardo Pounds, NP   11 months ago Essential hypertension   [place], Vernia Buff, NP   1 year ago Essential hypertension   [place], Vernia Buff, NP   1 year ago Need for influenza vaccination   [place], RPH-CPP       Future Appointments             In 6 days Gildardo Pounds, NP [place]

## 2021-02-20 ENCOUNTER — Ambulatory Visit: Payer: 59 | Attending: Nurse Practitioner | Admitting: Nurse Practitioner

## 2021-02-20 ENCOUNTER — Other Ambulatory Visit: Payer: Self-pay

## 2021-02-20 ENCOUNTER — Encounter: Payer: Self-pay | Admitting: Nurse Practitioner

## 2021-02-20 DIAGNOSIS — Z1231 Encounter for screening mammogram for malignant neoplasm of breast: Secondary | ICD-10-CM | POA: Diagnosis not present

## 2021-02-20 DIAGNOSIS — I10 Essential (primary) hypertension: Secondary | ICD-10-CM | POA: Diagnosis not present

## 2021-02-20 DIAGNOSIS — E1165 Type 2 diabetes mellitus with hyperglycemia: Secondary | ICD-10-CM

## 2021-02-20 DIAGNOSIS — E782 Mixed hyperlipidemia: Secondary | ICD-10-CM | POA: Diagnosis not present

## 2021-02-20 MED ORDER — METOPROLOL TARTRATE 50 MG PO TABS
50.0000 mg | ORAL_TABLET | Freq: Two times a day (BID) | ORAL | 1 refills | Status: DC
Start: 2021-02-20 — End: 2021-11-03

## 2021-02-20 MED ORDER — AMLODIPINE BESYLATE 10 MG PO TABS: 10.0000 mg | ORAL_TABLET | Freq: Every day | ORAL | 1 refills | Status: DC

## 2021-02-20 MED ORDER — LOSARTAN POTASSIUM-HCTZ 100-25 MG PO TABS: 1.0000 | ORAL_TABLET | Freq: Every day | ORAL | 1 refills | Status: DC

## 2021-02-20 MED ORDER — GLIPIZIDE ER 5 MG PO TB24: 5.0000 mg | ORAL_TABLET | Freq: Every day | ORAL | 3 refills | Status: DC

## 2021-02-20 NOTE — Progress Notes (Signed)
Virtual Visit via Telephone Note Due to national recommendations of social distancing due to [place] 19, telehealth visit is felt to be most appropriate for this patient at this time.  I discussed the limitations, risks, security and privacy concerns of performing an evaluation and management service by telephone and the availability of in person appointments. I also discussed with the patient that there may be a patient responsible charge related to this service. The patient expressed understanding and agreed to proceed.    I connected with Sydney Morrow on 02/20/21  at   8:30 AM EDT  EDT by telephone and verified that I am speaking with the correct person using two identifiers.  Location of Patient: Private Residence   Location of Provider: [place] [facility] [employer] Office    Persons participating in Telemedicine visit: [name] FNP-BC [place] BESECK[place]    History of Present Illness: Telemedicine visit for: HTN  has a past medical history of Acid reflux, Diabetes ([place]), History of anemia, History of hiatal hernia, Hypercholesterolemia, Hypertension, and Jaw inflammation, right (01/30/2017).   Endorses depression and stress today.  States there have been a few deaths in her family over the past few months. She declines referral for grief counseling or SSRI.   Essential Hypertension She endorses medication adherence however her metoprolol prescription is expired and the label she reads from the bottle she has at home has the date of 06-2020. Last home blood pressure reading: 158/79.  Currently prescribed amlodipine 10 mg daily, hydralazine 25 mg twice daily, Hyzaar 100-25 mg daily and Lopressor 50 mg twice daily. Denies chest pain, shortness of breath, palpitations, lightheadedness, dizziness, headaches or BLE edema.   BP Readings from Last 3 Encounters:  06/03/20 (!) 187/79  02/23/20 (!) 186/79  12/10/19 (!) 169/73    DM 2 Well-controlled with glipizide 5 mg daily.  She  denies any symptoms of hypo or hyperglycemia. Lab Results  Component Value Date   HGBA1C 6.1 (H) 09/27/2020      Past Medical History:  Diagnosis Date   Acid reflux    Diabetes (HCC)    History of anemia    History of hiatal hernia    Hypercholesterolemia    Hypertension    Jaw inflammation, right 01/30/2017    Past Surgical History:  Procedure Laterality Date   COLONOSCOPY N/A 03/05/2018   Procedure: COLONOSCOPY;  Surgeon: Danie Binder, MD;  [facility];  Service: Endoscopy;  Laterality: N/A;  12:00pm   COLONOSCOPY N/A 03/06/2018   Procedure: COLONOSCOPY;  Surgeon: Danie Binder, MD;  [facility];  Service: Endoscopy;  Laterality: N/A;   DENTAL SURGERY     ESOPHAGOGASTRODUODENOSCOPY  2011   white, raised, somewhat fixed plaques esophageal mucosa concerning for possible Candida esophagitis s/p brushing. Multiple gastric antral ulcerations, multiple small bulbar erosions and ulcerations.    ESOPHAGOGASTRODUODENOSCOPY N/A 03/05/2018   Procedure: ESOPHAGOGASTRODUODENOSCOPY (EGD);  Surgeon: Danie Binder, MD;  [facility];  Service: Endoscopy;  Laterality: N/A;   ESOPHAGOGASTRODUODENOSCOPY N/A 03/06/2018   Procedure: ESOPHAGOGASTRODUODENOSCOPY (EGD);  Surgeon: Danie Binder, MD;  [facility];  Service: Endoscopy;  Laterality: N/A;   TOOTH EXTRACTION N/A 02/01/2017   Procedure: FULL DENTAL EXTRACTIONS;  Surgeon: Diona Browner, DDS;  Location: [place];  Service: Oral Surgery;  Laterality: N/A;   TUBAL LIGATION      Family History  Problem Relation Age of Onset   Breast cancer Mother    Colon cancer Neg Hx  Colon polyps Neg Hx     Social History   Socioeconomic History   Marital status: Single    Spouse name: Not on file   Number of children: Not on file   Years of education: Not on file   Highest education level: Not on file  Occupational History   Not on file  Tobacco Use   Smoking status: Every Day    Packs/day: 0.50     Years: 15.00    Pack years: 7.50    Types: Cigarettes   Smokeless tobacco: Never  Vaping Use   Vaping Use: Never used  Substance and Sexual Activity   Alcohol use: No   Drug use: No   Sexual activity: Not Currently  Other Topics Concern   Not on file  Social History Narrative   Not on file   Social Determinants of Health   Financial Resource Strain: Not on file  Food Insecurity: Not on file  Transportation Needs: Not on file  Physical Activity: Not on file  Stress: Not on file  Social Connections: Not on file     Observations/Objective: Awake, alert and oriented x 3   Review of Systems  Constitutional:  Negative for fever, malaise/fatigue and weight loss.  HENT: Negative.  Negative for nosebleeds.   Eyes: Negative.  Negative for blurred vision, double vision and photophobia.  Respiratory: Negative.  Negative for cough and shortness of breath.   Cardiovascular: Negative.  Negative for chest pain, palpitations and leg swelling.  Gastrointestinal: Negative.  Negative for heartburn, nausea and vomiting.  Musculoskeletal: Negative.  Negative for myalgias.  Neurological: Negative.  Negative for dizziness, focal weakness, seizures and headaches.  Psychiatric/Behavioral: Negative.  Negative for suicidal ideas.    Assessment and Plan: Diagnoses and all orders for this visit:  Essential hypertension -     metoprolol tartrate (LOPRESSOR) 50 MG tablet; Take 1 tablet (50 mg total) by mouth 2 (two) times daily. -     amLODipine (NORVASC) 10 MG tablet; Take 1 tablet (10 mg total) by mouth daily. -     losartan-hydrochlorothiazide (HYZAAR) 100-25 MG tablet; Take 1 tablet by mouth daily. -     CMP14+EGFR; Future Continue all antihypertensives as prescribed.  Remember to bring in your blood pressure log with you for your follow up appointment.  DASH/Mediterranean Diets are healthier choices for HTN.    Mixed hyperlipidemia Continue atorvastatin 40 mg daily as  prescribed INSTRUCTIONS: Work on a low fat, heart healthy diet and participate in regular aerobic exercise program by working out at least 150 minutes per week; 5 days a week-30 minutes per day. Avoid red meat/beef/steak,  fried foods. junk foods, sodas, sugary drinks, unhealthy snacking, alcohol and smoking.  Drink at least 80 oz of water per day and monitor your carbohydrate intake daily.     Uncontrolled type 2 diabetes mellitus with hyperglycemia (HCC) -     glipiZIDE (GLUCOTROL XL) 5 MG 24 hr tablet; Take 1 tablet (5 mg total) by mouth daily with breakfast. -     Hemoglobin A1c; Future Continue blood sugar control as discussed in office today, low carbohydrate diet, and regular physical exercise as tolerated, 150 minutes per week (30 min each day, 5 days per week, or 50 min 3 days per week). Keep blood sugar logs with fasting goal of 90-130 mg/dl, post prandial (after you eat) less than 180.  For Hypoglycemia: BS <60 and Hyperglycemia BS >400; contact the clinic ASAP. Annual eye exams and foot exams are recommended.  Breast cancer screening by mammogram -     MM DIGITAL SCREENING BILATERAL; Future    Follow Up Instructions Return for PAP SMEAR.     I discussed the assessment and treatment plan with the patient. The patient was provided an opportunity to ask questions and all were answered. The patient agreed with the plan and demonstrated an understanding of the instructions.   The patient was advised to call back or seek an in-person evaluation if the symptoms worsen or if the condition fails to improve as anticipated.  I provided 15 minutes of non-face-to-face time during this encounter including median intraservice time, reviewing previous notes, labs, imaging, medications and explaining diagnosis and management.  Gildardo Pounds, FNP-BC

## 2021-02-28 ENCOUNTER — Encounter: Payer: Self-pay | Admitting: Pharmacist

## 2021-02-28 ENCOUNTER — Other Ambulatory Visit: Payer: Self-pay

## 2021-02-28 ENCOUNTER — Ambulatory Visit: Payer: 59 | Attending: Nurse Practitioner | Admitting: Pharmacist

## 2021-02-28 DIAGNOSIS — I10 Essential (primary) hypertension: Secondary | ICD-10-CM | POA: Diagnosis not present

## 2021-02-28 DIAGNOSIS — E1165 Type 2 diabetes mellitus with hyperglycemia: Secondary | ICD-10-CM

## 2021-02-28 NOTE — Progress Notes (Signed)
S:    PCP: [facility]   Patient arrives in poor spirits. She is experiencing a lot of anxiety today. She presents to the clinic for hypertension evaluation, counseling, and management. Patient was referred and last seen by Primary Care Provider on 02/20/2021. That visit occurred via telemedicine.  However, pt endorsed elevated home BP. All clinic readings we have from 2021 are elevated.   Medication adherence reported. She picked up her medications earlier this month. She tells me she took all medication this morning.   Denies chest pain/dyspnea/HA/blurred vision. Does endorse increased anxiety as she has experienced deaths in her family over the last month or so. She tells me "I know it will take some time to get my blood pressure down".  Current BP Medications include:  amlodipine 10 mg daily, hydralazine 25 mg BID, losartan-HCTZ 100-25 mg daily, metoprolol tartrate 50 mg BID  Dietary habits include: endorses compliance with salt restriction. Denies drinking excessive caffeine.  Exercise habits include: None Family / Social history:  - Fhx: no pertinent positives  - Tobacco: current 0.5 PPD smoker  - Alcohol: denies use  O:  Vitals:   02/28/21 1356  BP: (!) 147/67   Home BP readings: none  Last 3 Office BP readings: BP Readings from Last 3 Encounters:  02/28/21 (!) 147/67  06/03/20 (!) 187/79  02/23/20 (!) 186/79    BMET    Component Value Date/Time   NA 139 09/27/2020 1215   K 4.7 09/27/2020 1215   CL 102 09/27/2020 1215   CO2 17 (L) 09/27/2020 1215   GLUCOSE 77 09/27/2020 1215   GLUCOSE 99 06/01/2020 1620   BUN 39 (H) 09/27/2020 1215   CREATININE 1.77 (H) 09/27/2020 1215   CREATININE 2.00 (H) 06/01/2020 1620   CALCIUM 9.7 09/27/2020 1215   GFRNONAA 31 (L) 09/27/2020 1215   GFRNONAA 26 (L) 06/01/2020 1620   GFRAA 35 (L) 09/27/2020 1215   GFRAA 31 (L) 06/01/2020 1620    Renal function: CrCl cannot be calculated (Patient's most recent lab result is older than the  maximum 21 days allowed.).  Clinical ASCVD: No  The 10-year ASCVD risk score Mikey Bussing DC Jr., et al., 2013) is: 39.8%   Values used to calculate the score:     Age: 60 years     Sex: Female     Is Non-Hispanic African American: Yes     Diabetic: Yes     Tobacco smoker: Yes     Systolic Blood Pressure: [id number] mmHg     Is BP treated: Yes     HDL Cholesterol: 53 mg/dL     Total Cholesterol: 195 mg/dL  A/P: Hypertension longstanding currently uncontrolled on current medications. BP Goal = < 130/80 mmHg. Medication adherence reported. Control is suboptimal d/t dietary indiscretion, physical inactivity and increased stress. She recently declined SSRI/referral.  She sees her PCP in ~1 month. She wishes to hold off on changes for now.  -Continued current regimen.  -Future considerations: change metoprolol to carvedilol (eq dose would be 12.5 mg BID. Could optimize to 25 mg BID as tolerated).  Consider spironolactone, however, most recent renal function is not good. We are getting updated labs today. Should consider spiro if renal function/K permits.  -Counseled on lifestyle modifications for blood pressure control including reduced dietary sodium, increased exercise, adequate sleep.  Results reviewed and written information provided.   Total time in face-to-face counseling 30 minutes.   F/U Clinic Visit in 1 month w/ PCP.   Acquanetta Belling Ausdall,  PharmD, [facility], [place] [phone]

## 2021-03-01 LAB — CMP14+EGFR
ALT: 19 IU/L (ref 0–32)
AST: 14 IU/L (ref 0–40)
Albumin/Globulin Ratio: 1.7 (ref 1.2–2.2)
Albumin: 4.3 g/dL (ref 3.8–4.9)
Alkaline Phosphatase: 99 IU/L (ref 44–121)
BUN/Creatinine Ratio: 18 (ref 12–28)
BUN: 29 mg/dL — ABNORMAL HIGH (ref 8–27)
Bilirubin Total: <0.2 mg/dL (ref 0.0–1.2)
CO2: 20 mmol/L (ref 20–29)
Calcium: 9.8 mg/dL (ref 8.7–10.3)
Chloride: 102 mmol/L (ref 96–106)
Creatinine, Ser: 1.64 mg/dL — ABNORMAL HIGH (ref 0.57–1.00)
Globulin, Total: 2.6 g/dL (ref 1.5–4.5)
Glucose: 125 mg/dL — ABNORMAL HIGH (ref 65–99)
Potassium: 4.8 mmol/L (ref 3.5–5.2)
Sodium: 139 mmol/L (ref 134–144)
Total Protein: 6.9 g/dL (ref 6.0–8.5)
eGFR: 36 mL/min/{1.73_m2} — ABNORMAL LOW (ref >59)

## 2021-03-01 LAB — HEMOGLOBIN A1C
Est. average glucose Bld gHb Est-mCnc: 137 mg/dL
Hgb A1c MFr Bld: 6.4 % — ABNORMAL HIGH (ref 4.8–5.6)

## 2021-03-10 ENCOUNTER — Other Ambulatory Visit: Payer: Self-pay | Admitting: Nurse Practitioner

## 2021-03-10 DIAGNOSIS — K219 Gastro-esophageal reflux disease without esophagitis: Secondary | ICD-10-CM

## 2021-03-22 ENCOUNTER — Ambulatory Visit: Payer: Self-pay | Admitting: Nurse Practitioner

## 2021-04-07 ENCOUNTER — Ambulatory Visit: Payer: Self-pay | Admitting: Nurse Practitioner

## 2021-04-10 ENCOUNTER — Other Ambulatory Visit: Payer: Self-pay | Admitting: Nurse Practitioner

## 2021-04-10 DIAGNOSIS — J3089 Other allergic rhinitis: Secondary | ICD-10-CM

## 2021-04-10 NOTE — Telephone Encounter (Signed)
Requested medications are due for refill today.  Unknown  Requested medications are on the active medications list.  yes  Last refill. 10/17/2018 & 02/23/2020  Future visit scheduled.   yes  Notes to clinic.  Both prescriptions are expired.

## 2021-04-10 NOTE — Telephone Encounter (Signed)
Copied from [place] [phone]. Topic: Quick Communication - Rx Refill/Question >> Apr 10, 2021  2:06 PM Robina Ade, Helene Kelp D wrote: Medication: fluticasone (FLONASE) 50 MCG/ACT nasal spray, loratadine (CLARITIN) 10 MG tablet  Has the patient contacted their pharmacy? Yes.   (Agent: If no, request that the patient contact the pharmacy for the refill.) (Agent: If yes, when and what did the pharmacy advise?)  Preferred Pharmacy (with phone number or street name): [place], [place] [id number]  Agent: Please be advised that RX refills may take up to 3 business days. We ask that you follow-up with your pharmacy.

## 2021-04-13 MED ORDER — FLUTICASONE PROPIONATE 50 MCG/ACT NA SUSP: 2.0000 | Freq: Every day | NASAL | 1 refills | Status: DC

## 2021-04-13 MED ORDER — LORATADINE 10 MG PO TABS: ORAL_TABLET | ORAL | 2 refills | Status: DC

## 2021-05-09 ENCOUNTER — Other Ambulatory Visit: Payer: Self-pay | Admitting: Nurse Practitioner

## 2021-05-09 DIAGNOSIS — I10 Essential (primary) hypertension: Secondary | ICD-10-CM

## 2021-05-15 ENCOUNTER — Other Ambulatory Visit: Payer: Self-pay | Admitting: Nurse Practitioner

## 2021-05-23 ENCOUNTER — Ambulatory Visit: Payer: Self-pay | Admitting: Nurse Practitioner

## 2021-06-13 ENCOUNTER — Other Ambulatory Visit: Payer: Self-pay | Admitting: Nurse Practitioner

## 2021-06-13 DIAGNOSIS — K219 Gastro-esophageal reflux disease without esophagitis: Secondary | ICD-10-CM

## 2021-06-13 NOTE — Telephone Encounter (Signed)
Requested Prescriptions  Pending Prescriptions Disp Refills  . pantoprazole (PROTONIX) 20 MG tablet [Pharmacy Med Name: Pantoprazole Sodium 20 MG Oral Tablet Delayed Release] 180 tablet 0    Sig: TAKE 1 TABLET BY MOUTH TWICE DAILY BEFORE A MEAL     Gastroenterology: Proton Pump Inhibitors Passed - 06/13/2021  1:56 PM      Passed - Valid encounter within last 12 months    Recent Outpatient Visits          3 months ago Essential hypertension   [place], RPH-CPP   3 months ago Essential hypertension   [place], Vernia Buff, NP   9 months ago Essential hypertension   [place], Vernia Buff, NP   1 year ago Essential hypertension   [place], Vernia Buff, NP   1 year ago Essential hypertension   [place], Vernia Buff, NP      Future Appointments            In 3 days Gildardo Pounds, NP [place]

## 2021-06-16 ENCOUNTER — Telehealth: Payer: Self-pay | Admitting: Nurse Practitioner

## 2021-06-16 ENCOUNTER — Ambulatory Visit: Payer: Self-pay | Admitting: Nurse Practitioner

## 2021-06-16 NOTE — Telephone Encounter (Signed)
Pt is calling to ask Sydney Morrow if the medication for pantoprazole (PROTONIX) 20 MG tablet [[id number]] can be changed to 40mg ?. The pharmacy states that the pt insurance will only pay for the pantoprazole 40mg . 1 time a day. Pt states she is out of medication CB- [phone] [facility] [place], [place]

## 2021-06-17 ENCOUNTER — Other Ambulatory Visit: Payer: Self-pay | Admitting: Nurse Practitioner

## 2021-06-17 DIAGNOSIS — K219 Gastro-esophageal reflux disease without esophagitis: Secondary | ICD-10-CM

## 2021-06-17 MED ORDER — PANTOPRAZOLE SODIUM 40 MG PO TBEC: 40.0000 mg | DELAYED_RELEASE_TABLET | Freq: Every day | ORAL | 3 refills | Status: DC

## 2021-06-17 NOTE — Telephone Encounter (Signed)
Medication sent.

## 2021-07-24 ENCOUNTER — Telehealth: Payer: Self-pay | Admitting: Nurse Practitioner

## 2021-07-24 NOTE — Telephone Encounter (Signed)
Patient clled in asking for short supply of levothyroxine (SYNTHROID) 50 MCG tablet  until refill comes in, she is completely out., sent to   [place], [address] [place] Phone:  [phone]  Fax:  [phone]

## 2021-07-26 NOTE — Telephone Encounter (Signed)
Per patient she have not been to the Endocrinologist since last year and need [name] to place a new Rx for her since she have not had this medication since 07/22/21. Ph# [phone]

## 2021-07-28 NOTE — Telephone Encounter (Signed)
Provider denied please follow up Endocrinologist.

## 2021-07-31 ENCOUNTER — Telehealth: Payer: Self-pay | Admitting: Nurse Practitioner

## 2021-07-31 ENCOUNTER — Other Ambulatory Visit: Payer: Self-pay | Admitting: Nurse Practitioner

## 2021-07-31 DIAGNOSIS — E039 Hypothyroidism, unspecified: Secondary | ICD-10-CM

## 2021-07-31 DIAGNOSIS — E1122 Type 2 diabetes mellitus with diabetic chronic kidney disease: Secondary | ICD-10-CM

## 2021-07-31 DIAGNOSIS — E782 Mixed hyperlipidemia: Secondary | ICD-10-CM

## 2021-07-31 NOTE — Telephone Encounter (Signed)
Done

## 2021-07-31 NOTE — Telephone Encounter (Signed)
Patient needs new lab orders placed. Can you place those and I will mail to patient. She needs appt after labs done

## 2021-08-01 ENCOUNTER — Other Ambulatory Visit (HOSPITAL_COMMUNITY)
Admission: RE | Admit: 2021-08-01 | Discharge: 2021-08-01 | Disposition: A | Discharge status: Home / Self Care | Payer: 59 | Source: Ambulatory Visit | Attending: Nurse Practitioner | Admitting: Nurse Practitioner

## 2021-08-01 DIAGNOSIS — N1832 Chronic kidney disease, stage 3b: Secondary | ICD-10-CM | POA: Insufficient documentation

## 2021-08-01 DIAGNOSIS — E1122 Type 2 diabetes mellitus with diabetic chronic kidney disease: Secondary | ICD-10-CM | POA: Diagnosis present

## 2021-08-01 DIAGNOSIS — E039 Hypothyroidism, unspecified: Secondary | ICD-10-CM | POA: Insufficient documentation

## 2021-08-01 LAB — LIPID PANEL
Cholesterol: 321 mg/dL — ABNORMAL HIGH (ref 0–200)
HDL: 54 mg/dL (ref >40)
LDL Cholesterol: 217 mg/dL — ABNORMAL HIGH (ref 0–99)
Total CHOL/HDL Ratio: 5.9 RATIO
Triglycerides: 248 mg/dL — ABNORMAL HIGH (ref <150)
VLDL: 50 mg/dL — ABNORMAL HIGH (ref 0–40)

## 2021-08-01 LAB — COMPREHENSIVE METABOLIC PANEL
ALT: 22 U/L (ref 0–44)
AST: 19 U/L (ref 15–41)
Albumin: 4.1 g/dL (ref 3.5–5.0)
Alkaline Phosphatase: 86 U/L (ref 38–126)
Anion gap: 8 (ref 5–15)
BUN: 41 mg/dL — ABNORMAL HIGH (ref 8–23)
CO2: 22 mmol/L (ref 22–32)
Calcium: 9.3 mg/dL (ref 8.9–10.3)
Chloride: 105 mmol/L (ref 98–111)
Creatinine, Ser: 1.84 mg/dL — ABNORMAL HIGH (ref 0.44–1.00)
GFR, Estimated: 31 mL/min — ABNORMAL LOW (ref >60)
Glucose, Bld: 162 mg/dL — ABNORMAL HIGH (ref 70–99)
Potassium: 4.1 mmol/L (ref 3.5–5.1)
Sodium: 135 mmol/L (ref 135–145)
Total Bilirubin: 0.6 mg/dL (ref 0.3–1.2)
Total Protein: 7.6 g/dL (ref 6.5–8.1)

## 2021-08-01 LAB — VITAMIN D 25 HYDROXY (VIT D DEFICIENCY, FRACTURES): Vit D, 25-Hydroxy: 69.59 ng/mL (ref 30–100)

## 2021-08-01 LAB — T4, FREE: Free T4: 0.78 ng/dL (ref 0.61–1.12)

## 2021-08-01 LAB — TSH: TSH: 2.693 u[IU]/mL (ref 0.350–4.500)

## 2021-08-03 ENCOUNTER — Ambulatory Visit (INDEPENDENT_AMBULATORY_CARE_PROVIDER_SITE_OTHER): Payer: 59 | Admitting: Nurse Practitioner

## 2021-08-03 ENCOUNTER — Encounter: Payer: Self-pay | Admitting: Nurse Practitioner

## 2021-08-03 VITALS — BP 169/65 | HR 85 | Ht 68.0 in | Wt 169.0 lb

## 2021-08-03 DIAGNOSIS — E039 Hypothyroidism, unspecified: Secondary | ICD-10-CM | POA: Diagnosis not present

## 2021-08-03 DIAGNOSIS — I10 Essential (primary) hypertension: Secondary | ICD-10-CM

## 2021-08-03 DIAGNOSIS — E1122 Type 2 diabetes mellitus with diabetic chronic kidney disease: Secondary | ICD-10-CM

## 2021-08-03 DIAGNOSIS — E782 Mixed hyperlipidemia: Secondary | ICD-10-CM

## 2021-08-03 DIAGNOSIS — N1832 Chronic kidney disease, stage 3b: Secondary | ICD-10-CM | POA: Diagnosis not present

## 2021-08-03 DIAGNOSIS — E1165 Type 2 diabetes mellitus with hyperglycemia: Secondary | ICD-10-CM

## 2021-08-03 LAB — POCT GLYCOSYLATED HEMOGLOBIN (HGB A1C): HbA1c, POC (controlled diabetic range): 5.9 % (ref 0.0–7.0)

## 2021-08-03 MED ORDER — ATORVASTATIN CALCIUM 40 MG PO TABS: 40.0000 mg | ORAL_TABLET | Freq: Every day | ORAL | 0 refills | Status: DC

## 2021-08-03 MED ORDER — LEVOTHYROXINE SODIUM 50 MCG PO TABS: ORAL_TABLET | ORAL | 3 refills | Status: DC

## 2021-08-03 MED ORDER — GLIPIZIDE ER 5 MG PO TB24: 5.0000 mg | ORAL_TABLET | Freq: Every day | ORAL | 3 refills | Status: DC

## 2021-08-03 NOTE — Patient Instructions (Signed)

## 2021-08-03 NOTE — Progress Notes (Signed)
08/03/2021, 9:15 AM  Endocrinology follow-up note         Subjective:    Patient ID: Sydney Morrow, female    DOB: 1960-09-24.  SUNJAI LEVANDOSKI is engaged in follow-up for the management of  her currently controlled type 2 diabetes, hypothyroidism, hyperlipidemia.    PCP: Gildardo Pounds, NP   Past Medical History:  Diagnosis Date   Acid reflux    Diabetes ([place])    History of anemia    History of hiatal hernia    Hypercholesterolemia    Hypertension    Jaw inflammation, right 01/30/2017   Past Surgical History:  Procedure Laterality Date   COLONOSCOPY N/A 03/05/2018   Procedure: COLONOSCOPY;  Surgeon: Danie Binder, MD;  [facility];  Service: Endoscopy;  Laterality: N/A;  12:00pm   COLONOSCOPY N/A 03/06/2018   Procedure: COLONOSCOPY;  Surgeon: Danie Binder, MD;  [facility];  Service: Endoscopy;  Laterality: N/A;   DENTAL SURGERY     ESOPHAGOGASTRODUODENOSCOPY  2011   white, raised, somewhat fixed plaques esophageal mucosa concerning for possible Candida esophagitis s/p brushing. Multiple gastric antral ulcerations, multiple small bulbar erosions and ulcerations.    ESOPHAGOGASTRODUODENOSCOPY N/A 03/05/2018   Procedure: ESOPHAGOGASTRODUODENOSCOPY (EGD);  Surgeon: Danie Binder, MD;  [facility];  Service: Endoscopy;  Laterality: N/A;   ESOPHAGOGASTRODUODENOSCOPY N/A 03/06/2018   Procedure: ESOPHAGOGASTRODUODENOSCOPY (EGD);  Surgeon: Danie Binder, MD;  [facility];  Service: Endoscopy;  Laterality: N/A;   TOOTH EXTRACTION N/A 02/01/2017   Procedure: FULL DENTAL EXTRACTIONS;  Surgeon: Diona Browner, DDS;  Location: [facility];  Service: Oral Surgery;  Laterality: N/A;   TUBAL LIGATION     Social History   Socioeconomic History   Marital status: Single    Spouse name: Not on file   Number of children: Not on file   Years of education: Not on  file   Highest education level: Not on file  Occupational History   Not on file  Tobacco Use   Smoking status: Every Day    Packs/day: 0.50    Years: 15.00    Pack years: 7.50    Types: Cigarettes   Smokeless tobacco: Never  Vaping Use   Vaping Use: Never used  Substance and Sexual Activity   Alcohol use: No   Drug use: No   Sexual activity: Not Currently  Other Topics Concern   Not on file  Social History Narrative   Not on file   Social Determinants of Health   Financial Resource Strain: Not on file  Food Insecurity: Not on file  Transportation Needs: Not on file  Physical Activity: Not on file  Stress: Not on file  Social Connections: Not on file   Outpatient Encounter Medications as of 08/03/2021  Medication Sig   Blood Glucose Monitoring Suppl (TRUE METRIX METER) w/Device KIT Use as directed to check blood sugar at home.   Cholecalciferol (VITAMIN D) 125 MCG (5000 UT) CAPS Take 5,000 Int'l Units/kg by mouth daily.   Coenzyme Q10 (CO Q 10 PO) Take by mouth daily.   glucose blood (TRUE METRIX  BLOOD GLUCOSE TEST) test strip Use as instructed   hydrALAZINE (APRESOLINE) 25 MG tablet Take 1 tablet by mouth twice daily   loratadine (CLARITIN) 10 MG tablet TAKE 1 TABLET BY MOUTH ONCE DAILY AS NEEDED FOR ALLERGIES   Omega-3 Fatty Acids (FISH OIL PO) Take by mouth 2 (two) times a day.   pantoprazole (PROTONIX) 40 MG tablet Take 1 tablet (40 mg total) by mouth daily.   TRUEPLUS LANCETS 28G MISC Use as directed.   [DISCONTINUED] glipiZIDE (GLUCOTROL XL) 5 MG 24 hr tablet Take 1 tablet (5 mg total) by mouth daily with breakfast.   [DISCONTINUED] levothyroxine (EUTHYROX) 50 MCG tablet TAKE 1 TABLET BY MOUTH ONCE DAILY BEFORE BREAKFAST   amLODipine (NORVASC) 10 MG tablet Take 1 tablet (10 mg total) by mouth daily.   atorvastatin (LIPITOR) 40 MG tablet Take 1 tablet (40 mg total) by mouth daily.   fluticasone (FLONASE) 50 MCG/ACT nasal spray Place 2 sprays into both nostrils daily.    glipiZIDE (GLUCOTROL XL) 5 MG 24 hr tablet Take 1 tablet (5 mg total) by mouth daily with breakfast.   levothyroxine (EUTHYROX) 50 MCG tablet TAKE 1 TABLET BY MOUTH ONCE DAILY BEFORE BREAKFAST   losartan-hydrochlorothiazide (HYZAAR) 100-25 MG tablet Take 1 tablet by mouth daily.   metoprolol tartrate (LOPRESSOR) 50 MG tablet Take 1 tablet (50 mg total) by mouth 2 (two) times daily.   [DISCONTINUED] atorvastatin (LIPITOR) 40 MG tablet Take 1 tablet (40 mg total) by mouth daily.   [DISCONTINUED] methocarbamol (ROBAXIN) 750 MG tablet Take 1 tablet by mouth 4 times daily (Patient not taking: Reported on 08/03/2021)   No facility-administered encounter medications on file as of 08/03/2021.    ALLERGIES: Allergies  Allergen Reactions   Aspirin Other (See Comments)    Mouth, upper lip, nose area becomes numb   Morphine And Related Other (See Comments)    "Did not feel good"   Other     Cats   Peach Flavor     Mouth and body itches.    Penicillins Other (See Comments)    Unknown Has patient had a PCN reaction causing immediate rash, facial/tongue/throat swelling, SOB or lightheadedness with hypotension: Unknown Has patient had a PCN reaction causing severe rash involving mucus membranes or skin necrosis: Unknown Has patient had a PCN reaction that required hospitalization: No Has patient had a PCN reaction occurring within the last 10 years: No If all of the above answers are "NO", then may proceed with Cephalosporin use.     VACCINATION STATUS: Immunization History  Administered Date(s) Administered   Influenza Whole 05/15/2019   Influenza,inj,Quad PF,6+ Mos 09/10/2018   Pneumococcal Polysaccharide-23 09/10/2018   Tdap 09/10/2018    Diabetes She presents for her follow-up diabetic visit. She has type 2 diabetes mellitus. Onset time: She reports that she was diagnosed at approximate age of 60 years. Her disease course has been stable. There are no hypoglycemic associated symptoms.  Pertinent negatives for hypoglycemia include no confusion, headaches, pallor or seizures. Associated symptoms include fatigue. Pertinent negatives for diabetes include no blurred vision, no chest pain, no polydipsia, no polyphagia and no polyuria. There are no hypoglycemic complications. Symptoms are stable. Diabetic complications include nephropathy. Risk factors for coronary artery disease include diabetes mellitus, dyslipidemia, family history, hypertension, sedentary lifestyle, tobacco exposure and post-menopausal. Current diabetic treatment includes oral agent (monotherapy). She is compliant with treatment most of the time. Her weight is fluctuating minimally. She is following a generally unhealthy diet. When asked about meal  planning, she reported none. She has not had a previous visit with a dietitian. She rarely participates in exercise. (She presents today, after being absent for over a year, with no meter or logs to review.  She admits she only checks her glucose maybe 3 times per week.  Her POCT A1c today is 5.9%, decreasing from last visit of 6.4%.  She denies any symptoms of hypoglycemia.) An ACE inhibitor/angiotensin II receptor blocker is being taken. She does not see a podiatrist.Eye exam is current.  Hyperlipidemia This is a chronic problem. The current episode started more than 1 year ago. The problem is uncontrolled. Recent lipid tests were reviewed and are high. Exacerbating diseases include chronic renal disease, diabetes and hypothyroidism. Factors aggravating her hyperlipidemia include beta blockers, fatty foods, thiazides and smoking. Pertinent negatives include no chest pain, myalgias or shortness of breath. Current antihyperlipidemic treatment includes statins (cannot remember if she has been taking it lately). The current treatment provides mild improvement of lipids. Compliance problems include adherence to exercise and adherence to diet.  Risk factors for coronary artery disease  include family history, dyslipidemia, diabetes mellitus, hypertension, a sedentary lifestyle and post-menopausal.  Hypertension This is a chronic problem. The current episode started more than 1 year ago. The problem is unchanged. The problem is uncontrolled. Pertinent negatives include no blurred vision, chest pain, headaches, palpitations or shortness of breath. Agents associated with hypertension include thyroid hormones. Risk factors for coronary artery disease include diabetes mellitus, dyslipidemia and sedentary lifestyle. Past treatments include angiotensin blockers, central alpha agonists and diuretics. The current treatment provides no improvement. Compliance problems include exercise and diet.  Hypertensive end-organ damage includes kidney disease. Identifiable causes of hypertension include chronic renal disease.    Review of systems  Constitutional: + Minimally fluctuating body weight,  current Body mass index is 25.7 kg/m. , + fatigue, no subjective hyperthermia, no subjective hypothermia Eyes: no blurry vision, no xerophthalmia ENT: no sore throat, no nodules palpated in throat, no dysphagia/odynophagia, no hoarseness Cardiovascular: no chest pain, no shortness of breath, no palpitations, no leg swelling Respiratory: no cough, no shortness of breath Gastrointestinal: no nausea/vomiting/diarrhea Musculoskeletal: no muscle/joint aches Skin: no rashes, no hyperemia Neurological: no tremors, no numbness, no tingling, no dizziness Psychiatric: no depression, no anxiety, reports high amount of stress lately with caring for her mother with multiple medical conditions.   Objective:    BP (!) 169/65    Pulse 85    Ht _0  (1.727 m)    Wt 169 lb (76.7 kg)    LMP 08/13/2014 (Approximate)    SpO2 98%    BMI 25.70 kg/m   Wt Readings from Last 3 Encounters:  08/03/21 169 lb (76.7 kg)  06/03/20 173 lb 6.4 oz (78.7 kg)  02/23/20 173 lb (78.5 kg)     BP Readings from Last 3 Encounters:   08/03/21 (!) 169/65  02/28/21 (!) 147/67  06/03/20 (!) 187/79     Physical Exam- Limited  Constitutional:  Body mass index is 25.7 kg/m. , not in acute distress, normal state of mind Eyes:  EOMI, no exophthalmos Neck: Supple Cardiovascular: RRR, no murmurs, rubs, or gallops, no edema Respiratory: Adequate breathing efforts, no crackles, rales, rhonchi, or wheezing Musculoskeletal: no gross deformities, strength intact in all four extremities, no gross restriction of joint movements Skin:  no rashes, no hyperemia Neurological: no tremor with outstretched hands   CMP ( most recent) CMP     Component Value Date/Time   NA 135 08/01/2021  1211   NA 139 02/28/2021 1043   K 4.1 08/01/2021 1211   CL 105 08/01/2021 1211   CO2 22 08/01/2021 1211   GLUCOSE 162 (H) 08/01/2021 1211   BUN 41 (H) 08/01/2021 1211   BUN 29 (H) 02/28/2021 1043   CREATININE 1.84 (H) 08/01/2021 1211   CREATININE 2.00 (H) 06/01/2020 1620   CALCIUM 9.3 08/01/2021 1211   PROT 7.6 08/01/2021 1211   PROT 6.9 02/28/2021 1043   ALBUMIN 4.1 08/01/2021 1211   ALBUMIN 4.3 02/28/2021 1043   AST 19 08/01/2021 1211   ALT 22 08/01/2021 1211   ALKPHOS 86 08/01/2021 1211   BILITOT 0.6 08/01/2021 1211   BILITOT <0.2 02/28/2021 1043   GFRNONAA 31 (L) 08/01/2021 1211   GFRNONAA 26 (L) 06/01/2020 1620   GFRAA 35 (L) 09/27/2020 1215   GFRAA 31 (L) 06/01/2020 1620     Diabetic Labs (most recent): Lab Results  Component Value Date   HGBA1C 5.9 08/03/2021   HGBA1C 6.4 (H) 02/28/2021   HGBA1C 6.1 (H) 09/27/2020     Lipid Panel ( most recent) Lipid Panel     Component Value Date/Time   CHOL 321 (H) 08/01/2021 1211   CHOL 195 09/27/2020 1215   TRIG 248 (H) 08/01/2021 1211   HDL 54 08/01/2021 1211   HDL 53 09/27/2020 1215   CHOLHDL 5.9 08/01/2021 1211   VLDL 50 (H) 08/01/2021 1211   LDLCALC 217 (H) 08/01/2021 1211   LDLCALC 106 (H) 09/27/2020 1215       Assessment & Plan:   1) Type 2 diabetes mellitus  with stage 3 chronic kidney disease, without long-term current use of insulin ([place])  - Oneika E Wimmer has currently uncontrolled symptomatic type 2 DM since 60 years of age.  She presents today, after being absent for over a year, with no meter or logs to review.  She admits she only checks her glucose maybe 3 times per week.  Her POCT A1c today is 5.9%, decreasing from last visit of 6.4%.  She denies any symptoms of hypoglycemia.  -her diabetes is complicated by stage 3 renal insufficiency, sedentary life, heavy smoking and she remains at extremely high risk for more acute and chronic complications which include CAD, CVA, CKD, retinopathy, and neuropathy. These are all discussed in detail with her.  - Nutritional counseling repeated at each appointment due to patients tendency to fall back in to old habits.  - The patient admits there is a room for improvement in their diet and drink choices. -  Suggestion is made for the patient to avoid simple carbohydrates from their diet including Cakes, Sweet Desserts / Pastries, Ice Cream, Soda (diet and regular), Sweet Tea, Candies, Chips, Cookies, Sweet Pastries, Store Bought Juices, Alcohol in Excess of 1-2 drinks a day, Artificial Sweeteners, Coffee Creamer, and "Sugar-free" Products. This will help patient to have stable blood glucose profile and potentially avoid unintended weight gain.   - I encouraged the patient to switch to unprocessed or minimally processed complex starch and increased protein intake (animal or plant source), fruits, and vegetables.   - Patient is advised to stick to a routine mealtimes to eat 3 meals a day and avoid unnecessary snacks (to snack only to correct hypoglycemia).  -Given the lack of meter or logs to review today, no changes will be made to her medication regimen.  She is advised to continue Glipizide 5 mg XL daily with breakfast.   -She is advised to start monitoring blood glucose at  least once daily, before  breakfast (and if she experiences symptoms of hypoglycemia), and call the clinic if she has readings less than 70 or greater than 300 for 3 tests in a row.   2) Lipids/HPL: Her most recent lipid panel from 08/01/21 shows uncontrolled LDL of 217 and elevated triglycerides of 248- significantly worse than previously.  She cannot remember if she has been taking her medication lately.  She is advised to restart Lipitor 40 mg po daily at bedtime and Fish oil supplement- refill sent in today.  Side effects and precautions discussed with her.  3) HTN Her blood pressure is not controlled to target.  She is advised to continue Amlodipine 10 mg po daily, Hydralazine 25 mg po twice daily, Losartan-HCT 100-25 mg po daily, and Metoprolol 50 mg po twice daily.  She has a history of not taking her medications properly.  She is advised to consistently take her medications.  4) Hypothyroidism: Her previsit TFTs indicate appropriate thyroid hormone replacement.  She is advised to continue Levothyroxine 50 mcg po daily before breakfast.   - We discussed about the correct intake of her thyroid hormone, on empty stomach at fasting, with water, separated by at least 30 minutes from breakfast and other medications,  and separated by more than 4 hours from calcium, iron, multivitamins, acid reflux medications (PPIs). -Patient is made aware of the fact that thyroid hormone replacement is needed for life, dose to be adjusted by periodic monitoring of thyroid function tests.  5) Chronic heavy smoking: The patient was counseled on the dangers of tobacco use, and was advised to quit.  Reviewed strategies to maximize success, including removing cigarettes and smoking materials from environment.  6) Weight management:  Her Body mass index is 25.7 kg/m.  She is not a candidate for major weight loss.  Exercise and carbs regimen discussed in detail with her.   - I advised patient to maintain close follow up with Gildardo Pounds, NP for primary care needs.    I spent 40 minutes in the care of the patient today including review of labs from [facility], Lipids, Thyroid Function, Hematology (current and previous including abstractions from other facilities); face-to-face time discussing  her blood glucose readings/logs, discussing hypoglycemia and hyperglycemia episodes and symptoms, medications doses, her options of short and long term treatment based on the latest standards of care / guidelines;  discussion about incorporating lifestyle medicine;  and documenting the encounter.    Please refer to Patient Instructions for Blood Glucose Monitoring and Insulin/Medications Dosing Guide"  in media tab for additional information. Please  also refer to " Patient Self Inventory" in the Media  tab for reviewed elements of pertinent patient history.  Suann Larry participated in the discussions, expressed understanding, and voiced agreement with the above plans.  All questions were answered to her satisfaction. she is encouraged to contact clinic should she have any questions or concerns prior to her return visit.   Follow up plan: - Return in about 3 months (around 11/01/2021) for Diabetes F/U- A1c and UM in office, No previsit labs, Bring meter and logs.  Rayetta Pigg, [facility] [address] [place],  [address] Phone: [phone] Fax: [phone]  08/03/2021, 9:15 AM

## 2021-08-18 ENCOUNTER — Telehealth: Payer: Self-pay

## 2021-08-18 NOTE — Telephone Encounter (Signed)
Telephoned patient at mobile number. Left [facility] contact information.

## 2021-08-28 ENCOUNTER — Ambulatory Visit: Payer: 59 | Attending: Nurse Practitioner | Admitting: Nurse Practitioner

## 2021-08-28 ENCOUNTER — Other Ambulatory Visit: Payer: Self-pay

## 2021-10-25 ENCOUNTER — Ambulatory Visit: Payer: 59 | Admitting: Nurse Practitioner

## 2021-11-01 ENCOUNTER — Ambulatory Visit: Payer: 59 | Admitting: Nurse Practitioner

## 2021-11-03 ENCOUNTER — Other Ambulatory Visit: Payer: Self-pay

## 2021-11-03 ENCOUNTER — Other Ambulatory Visit (HOSPITAL_COMMUNITY)
Admission: RE | Admit: 2021-11-03 | Discharge: 2021-11-03 | Disposition: A | Discharge status: Home / Self Care | Payer: 59 | Source: Ambulatory Visit | Attending: Nurse Practitioner | Admitting: Nurse Practitioner

## 2021-11-03 ENCOUNTER — Ambulatory Visit: Payer: 59 | Attending: Nurse Practitioner | Admitting: Nurse Practitioner

## 2021-11-03 ENCOUNTER — Encounter: Payer: Self-pay | Admitting: Nurse Practitioner

## 2021-11-03 VITALS — BP 168/74 | HR 79 | Wt 170.6 lb

## 2021-11-03 DIAGNOSIS — Z1211 Encounter for screening for malignant neoplasm of colon: Secondary | ICD-10-CM

## 2021-11-03 DIAGNOSIS — Z124 Encounter for screening for malignant neoplasm of cervix: Secondary | ICD-10-CM | POA: Insufficient documentation

## 2021-11-03 DIAGNOSIS — I10 Essential (primary) hypertension: Secondary | ICD-10-CM | POA: Diagnosis not present

## 2021-11-03 DIAGNOSIS — E782 Mixed hyperlipidemia: Secondary | ICD-10-CM

## 2021-11-03 DIAGNOSIS — R7989 Other specified abnormal findings of blood chemistry: Secondary | ICD-10-CM

## 2021-11-03 DIAGNOSIS — Z862 Personal history of diseases of the blood and blood-forming organs and certain disorders involving the immune mechanism: Secondary | ICD-10-CM

## 2021-11-03 DIAGNOSIS — K219 Gastro-esophageal reflux disease without esophagitis: Secondary | ICD-10-CM

## 2021-11-03 DIAGNOSIS — M25571 Pain in right ankle and joints of right foot: Secondary | ICD-10-CM

## 2021-11-03 DIAGNOSIS — J3089 Other allergic rhinitis: Secondary | ICD-10-CM | POA: Diagnosis not present

## 2021-11-03 MED ORDER — FLUTICASONE PROPIONATE 50 MCG/ACT NA SUSP: 2.0000 | Freq: Every day | NASAL | 1 refills | Status: DC

## 2021-11-03 MED ORDER — DICLOFENAC SODIUM 1 % EX GEL: 2.0000 g | Freq: Four times a day (QID) | CUTANEOUS | 0 refills | Status: AC

## 2021-11-03 MED ORDER — AMLODIPINE BESYLATE 10 MG PO TABS: 10.0000 mg | ORAL_TABLET | Freq: Every day | ORAL | 1 refills | Status: DC

## 2021-11-03 MED ORDER — METOPROLOL TARTRATE 50 MG PO TABS: 50.0000 mg | ORAL_TABLET | Freq: Two times a day (BID) | ORAL | 1 refills | Status: DC

## 2021-11-03 MED ORDER — PANTOPRAZOLE SODIUM 40 MG PO TBEC: 40.0000 mg | DELAYED_RELEASE_TABLET | Freq: Every day | ORAL | 3 refills | Status: DC

## 2021-11-03 MED ORDER — ATORVASTATIN CALCIUM 40 MG PO TABS: 40.0000 mg | ORAL_TABLET | Freq: Every day | ORAL | 0 refills | Status: DC

## 2021-11-03 MED ORDER — LOSARTAN POTASSIUM-HCTZ 100-25 MG PO TABS: 1.0000 | ORAL_TABLET | Freq: Every day | ORAL | 1 refills | Status: DC

## 2021-11-03 NOTE — Progress Notes (Signed)
? ?Assessment & Plan:  ?Sydney Morrow was seen today for gynecologic exam. ? ?Diagnoses and all orders for this visit: ? ?Encounter for Papanicolaou smear for cervical cancer screening ?-     Cytology - PAP ?-     Cervicovaginal ancillary only ? ?Essential hypertension ?-     losartan-hydrochlorothiazide (HYZAAR) 100-25 MG tablet; Take 1 tablet by mouth daily. ?-     metoprolol tartrate (LOPRESSOR) 50 MG tablet; Take 1 tablet (50 mg total) by mouth 2 (two) times daily. ?-     amLODipine (NORVASC) 10 MG tablet; Take 1 tablet (10 mg total) by mouth daily. ?-     CMP14+EGFR ? ?Mixed hyperlipidemia ?-     atorvastatin (LIPITOR) 40 MG tablet; Take 1 tablet (40 mg total) by mouth daily. ? ?Allergic rhinitis due to other allergic trigger, unspecified seasonality ?-     fluticasone (FLONASE) 50 MCG/ACT nasal spray; Place 2 sprays into both nostrils daily. ? ?GERD without esophagitis ?-     pantoprazole (PROTONIX) 40 MG tablet; Take 1 tablet (40 mg total) by mouth daily. ? ?Elevated serum creatinine ?-     CMP14+EGFR ? ?History of anemia ?-     CBC ? ?Acute right ankle pain ?-     diclofenac Sodium (VOLTAREN) 1 % GEL; Apply 2 g topically 4 (four) times daily. ? ?Colon cancer screening ?-     Ambulatory referral to Gastroenterology ? ? ? ?Patient has been counseled on age-appropriate routine health concerns for screening and prevention. These are reviewed and up-to-date. Referrals have been placed accordingly. Immunizations are up-to-date or declined.    ?Subjective:  ? ?Chief Complaint  ?Patient presents with  ? Gynecologic Exam  ? ?HPI ?Sydney Morrow 61 y.o. female presents to office today for well woman exam. ?Patient is aware that she is well overdue for her mammogram. ?She was referred to gastroenterology for colon cancer screening today  ? ?Notes increased stress as she is the primary caregiver for her mother who has moderate dementia.   ?She endorses medication adherence however several of her blood pressure medications are  showing is expired today.  Blood pressure is poorly controlled however I am reluctant to make any adjustments with her medication as it appears there may be an issue nonadherence.   ? ?1 month ago she notes a cart struck the medial aspect of her right ankle and heel.  She had immediate onset of swelling and pain at that time.  Over time the swelling has mostly resolved however there is still pain in the heel with prolonged standing and there is also mild pain of the medial malleolar area.  She has been soaking her foot in Epsom salt which does provide some relief and she has also implemented gel inserts in her shoes. ? ? ? ?Review of Systems  ?Constitutional: Negative.  Negative for chills, fever, malaise/fatigue and weight loss.  ?Respiratory: Negative.  Negative for cough, sputum production, shortness of breath and wheezing.   ?Cardiovascular: Negative.  Negative for chest pain and leg swelling.  ?Gastrointestinal: Negative.  Negative for abdominal pain, blood in stool, constipation, diarrhea, heartburn, melena, nausea and vomiting.  ?Genitourinary: Negative.   ?Musculoskeletal:  Positive for joint pain.  ?Skin: Negative.  Negative for rash.  ?Neurological: Negative.  Negative for dizziness, tremors, speech change, focal weakness, seizures and headaches.  ?Psychiatric/Behavioral:  Positive for depression. Negative for suicidal ideas. The patient is not nervous/anxious and does not have insomnia.   ? ?Past Medical History:  ?Diagnosis  Date  ? Acid reflux   ? Diabetes ([place])   ? History of anemia   ? History of hiatal hernia   ? Hypercholesterolemia   ? Hypertension   ? Jaw inflammation, right 01/30/2017  ? ? ?Past Surgical History:  ?Procedure Laterality Date  ? COLONOSCOPY N/A 03/05/2018  ? Procedure: COLONOSCOPY;  Surgeon: Danie Binder, MD;  [facility];  Service: Endoscopy;  Laterality: N/A;  12:00pm  ? COLONOSCOPY N/A 03/06/2018  ? Procedure: COLONOSCOPY;  Surgeon: Danie Binder, MD;  [facility];  Service: Endoscopy;  Laterality: N/A;  ? DENTAL SURGERY    ? ESOPHAGOGASTRODUODENOSCOPY  2011  ? white, raised, somewhat fixed plaques esophageal mucosa concerning for possible Candida esophagitis s/p brushing. Multiple gastric antral ulcerations, multiple small bulbar erosions and ulcerations.   ? ESOPHAGOGASTRODUODENOSCOPY N/A 03/05/2018  ? Procedure: ESOPHAGOGASTRODUODENOSCOPY (EGD);  Surgeon: Danie Binder, MD;  [facility];  Service: Endoscopy;  Laterality: N/A;  ? ESOPHAGOGASTRODUODENOSCOPY N/A 03/06/2018  ? Procedure: ESOPHAGOGASTRODUODENOSCOPY (EGD);  Surgeon: Danie Binder, MD;  [facility];  Service: Endoscopy;  Laterality: N/A;  ? TOOTH EXTRACTION N/A 02/01/2017  ? Procedure: FULL DENTAL EXTRACTIONS;  Surgeon: Diona Browner, DDS;  Location: [place];  Service: Oral Surgery;  Laterality: N/A;  ? TUBAL LIGATION    ? ? ?Family History  ?Problem Relation Age of Onset  ? Breast cancer Mother   ? Colon cancer Neg Hx   ? Colon polyps Neg Hx   ? ? ?Social History Reviewed with no changes to be made today.  ? ?Outpatient Medications Prior to Visit  ?Medication Sig Dispense Refill  ? Blood Glucose Monitoring Suppl (TRUE METRIX METER) w/Device KIT Use as directed to check blood sugar at home. 1 kit 0  ? Cholecalciferol (VITAMIN D) 125 MCG (5000 UT) CAPS Take 5,000 Int'l Units/kg by mouth daily.    ? Coenzyme Q10 (CO Q 10 PO) Take by mouth daily.    ? glipiZIDE (GLUCOTROL XL) 5 MG 24 hr tablet Take 1 tablet (5 mg total) by mouth daily with breakfast. 90 tablet 3  ? glucose blood (TRUE METRIX BLOOD GLUCOSE TEST) test strip Use as instructed 100 each 12  ? hydrALAZINE (APRESOLINE) 25 MG tablet Take 1 tablet by mouth twice daily 180 tablet 1  ? levothyroxine (EUTHYROX) 50 MCG tablet TAKE 1 TABLET BY MOUTH ONCE DAILY BEFORE BREAKFAST 90 tablet 3  ? loratadine (CLARITIN) 10 MG tablet TAKE 1 TABLET BY MOUTH ONCE DAILY AS NEEDED FOR ALLERGIES 30 tablet 2  ? Omega-3 Fatty Acids (FISH OIL  PO) Take by mouth 2 (two) times a day.    ? TRUEPLUS LANCETS 28G MISC Use as directed. 100 each 11  ? amLODipine (NORVASC) 10 MG tablet Take 1 tablet (10 mg total) by mouth daily. 90 tablet 1  ? atorvastatin (LIPITOR) 40 MG tablet Take 1 tablet (40 mg total) by mouth daily. 90 tablet 0  ? fluticasone (FLONASE) 50 MCG/ACT nasal spray Place 2 sprays into both nostrils daily. 16 g 1  ? losartan-hydrochlorothiazide (HYZAAR) 100-25 MG tablet Take 1 tablet by mouth daily. 90 tablet 1  ? metoprolol tartrate (LOPRESSOR) 50 MG tablet Take 1 tablet (50 mg total) by mouth 2 (two) times daily. 180 tablet 1  ? pantoprazole (PROTONIX) 40 MG tablet Take 1 tablet (40 mg total) by mouth daily. 90 tablet 3  ? ?No facility-administered medications prior to visit.  ? ? ?Allergies  ?Allergen Reactions  ?  Aspirin Other (See Comments)  ?  Mouth, upper lip, nose area becomes numb  ? Morphine And Related Other (See Comments)  ?  "Did not feel good"  ? Other   ?  Cats  ? Peach Flavor   ?  Mouth and body itches.   ? Penicillins Other (See Comments)  ?  Unknown ?Has patient had a PCN reaction causing immediate rash, facial/tongue/throat swelling, SOB or lightheadedness with hypotension: Unknown ?Has patient had a PCN reaction causing severe rash involving mucus membranes or skin necrosis: Unknown ?Has patient had a PCN reaction that required hospitalization: No ?Has patient had a PCN reaction occurring within the last 10 years: No ?If all of the above answers are "NO", then may proceed with Cephalosporin use. ?  ? ? ?   ?Objective:  ?  ?BP (!) 168/74   Pulse 79   Wt 170 lb 9.6 oz (77.4 kg)   LMP 08/13/2014 (Approximate)   SpO2 98%   BMI 25.94 kg/m?  ?Wt Readings from Last 3 Encounters:  ?11/03/21 170 lb 9.6 oz (77.4 kg)  ?08/03/21 169 lb (76.7 kg)  ?06/03/20 173 lb 6.4 oz (78.7 kg)  ? ? ?Physical Exam ?Exam conducted with a chaperone present.  ?Constitutional:   ?   Appearance: She is well-developed.  ?HENT:  ?   Head: Normocephalic.   ?Cardiovascular:  ?   Rate and Rhythm: Normal rate and regular rhythm.  ?   Pulses:     ?     Dorsalis pedis pulses are 1+ on the right side and 1+ on the left side.  ?   Heart sounds: Normal heart sounds.  ?

## 2021-11-04 LAB — CMP14+EGFR
ALT: 24 IU/L (ref 0–32)
AST: 15 IU/L (ref 0–40)
Albumin/Globulin Ratio: 1.8 (ref 1.2–2.2)
Albumin: 4.7 g/dL (ref 3.8–4.8)
Alkaline Phosphatase: 101 IU/L (ref 44–121)
BUN/Creatinine Ratio: 24 (ref 12–28)
BUN: 47 mg/dL — ABNORMAL HIGH (ref 8–27)
Bilirubin Total: 0.3 mg/dL (ref 0.0–1.2)
CO2: 21 mmol/L (ref 20–29)
Calcium: 9.8 mg/dL (ref 8.7–10.3)
Chloride: 104 mmol/L (ref 96–106)
Creatinine, Ser: 2 mg/dL — ABNORMAL HIGH (ref 0.57–1.00)
Globulin, Total: 2.6 g/dL (ref 1.5–4.5)
Glucose: 112 mg/dL — ABNORMAL HIGH (ref 70–99)
Potassium: 4.5 mmol/L (ref 3.5–5.2)
Sodium: 141 mmol/L (ref 134–144)
Total Protein: 7.3 g/dL (ref 6.0–8.5)
eGFR: 28 mL/min/{1.73_m2} — ABNORMAL LOW (ref >59)

## 2021-11-04 LAB — CBC
Hematocrit: 43.2 % (ref 34.0–46.6)
Hemoglobin: 14.8 g/dL (ref 11.1–15.9)
MCH: 31.8 pg (ref 26.6–33.0)
MCHC: 34.3 g/dL (ref 31.5–35.7)
MCV: 93 fL (ref 79–97)
Platelets: 209 10*3/uL (ref 150–450)
RBC: 4.66 x10E6/uL (ref 3.77–5.28)
RDW: 12.5 % (ref 11.7–15.4)
WBC: 5.7 10*3/uL (ref 3.4–10.8)

## 2021-11-06 ENCOUNTER — Other Ambulatory Visit: Payer: Self-pay | Admitting: Nurse Practitioner

## 2021-11-06 LAB — CERVICOVAGINAL ANCILLARY ONLY
Bacterial Vaginitis (gardnerella): NEGATIVE
Candida Glabrata: POSITIVE — AB
Candida Vaginitis: POSITIVE — AB
Chlamydia: NEGATIVE
Comment: NEGATIVE
Comment: NEGATIVE
Comment: NEGATIVE
Comment: NEGATIVE
Comment: NEGATIVE
Comment: NORMAL
Neisseria Gonorrhea: NEGATIVE
Trichomonas: NEGATIVE

## 2021-11-06 MED ORDER — FLUCONAZOLE 150 MG PO TABS: ORAL_TABLET | ORAL | 0 refills | Status: DC

## 2021-11-07 ENCOUNTER — Telehealth: Payer: Self-pay | Admitting: Nurse Practitioner

## 2021-11-07 LAB — CYTOLOGY - PAP
Comment: NEGATIVE
Diagnosis: NEGATIVE
High risk HPV: NEGATIVE

## 2021-11-07 NOTE — Telephone Encounter (Signed)
Copied from [place] [id number]. Topic: General - Inquiry >> Nov 06, 2021  3:33 PM Loma Boston wrote: Reason for CRM: Pt states was in to see dr Ludwig Clarks  and dr had looked at her ankle and had said would prescribe her something for pain. Pt states nothing was sent in, she can not take ibuprofen. She is requesting a pain medication. Pls advise or call pt back [phone]

## 2021-11-08 NOTE — Patient Instructions (Signed)

## 2021-11-09 ENCOUNTER — Encounter: Payer: Self-pay | Admitting: Nurse Practitioner

## 2021-11-09 ENCOUNTER — Ambulatory Visit: Payer: 59 | Admitting: Nurse Practitioner

## 2021-11-09 VITALS — BP 142/62 | HR 66 | Ht 68.0 in | Wt 169.4 lb

## 2021-11-09 DIAGNOSIS — E1122 Type 2 diabetes mellitus with diabetic chronic kidney disease: Secondary | ICD-10-CM | POA: Diagnosis not present

## 2021-11-09 DIAGNOSIS — N184 Chronic kidney disease, stage 4 (severe): Secondary | ICD-10-CM | POA: Diagnosis not present

## 2021-11-09 DIAGNOSIS — E039 Hypothyroidism, unspecified: Secondary | ICD-10-CM | POA: Diagnosis not present

## 2021-11-09 DIAGNOSIS — I10 Essential (primary) hypertension: Secondary | ICD-10-CM

## 2021-11-09 DIAGNOSIS — E782 Mixed hyperlipidemia: Secondary | ICD-10-CM

## 2021-11-09 DIAGNOSIS — E1165 Type 2 diabetes mellitus with hyperglycemia: Secondary | ICD-10-CM

## 2021-11-09 LAB — POCT GLYCOSYLATED HEMOGLOBIN (HGB A1C): HbA1c, POC (controlled diabetic range): 5.8 % (ref 0.0–7.0)

## 2021-11-09 MED ORDER — LEVOTHYROXINE SODIUM 50 MCG PO TABS: ORAL_TABLET | ORAL | 3 refills | Status: DC

## 2021-11-09 MED ORDER — GLIPIZIDE ER 5 MG PO TB24: 5.0000 mg | ORAL_TABLET | Freq: Every day | ORAL | 3 refills | Status: DC

## 2021-11-09 NOTE — Progress Notes (Signed)
? ?                                                       ?     11/09/2021, 10:42 AM ? ?Endocrinology follow-up note ?        ?Subjective:  ? ? Patient ID: Sydney Morrow, female    DOB: Oct 24, 1960.  ?Sydney Morrow is engaged in follow-up for the management of  her currently controlled type 2 diabetes, hypothyroidism, hyperlipidemia.   ? ?PCP: Gildardo Pounds, NP ? ? ?Past Medical History:  ?Diagnosis Date  ? Acid reflux   ? Diabetes ([place])   ? History of anemia   ? History of hiatal hernia   ? Hypercholesterolemia   ? Hypertension   ? Jaw inflammation, right 01/30/2017  ? ?Past Surgical History:  ?Procedure Laterality Date  ? COLONOSCOPY N/A 03/05/2018  ? Procedure: COLONOSCOPY;  Surgeon: Danie Binder, MD;  [facility];  Service: Endoscopy;  Laterality: N/A;  12:00pm  ? COLONOSCOPY N/A 03/06/2018  ? Procedure: COLONOSCOPY;  Surgeon: Danie Binder, MD;  [facility];  Service: Endoscopy;  Laterality: N/A;  ? DENTAL SURGERY    ? ESOPHAGOGASTRODUODENOSCOPY  2011  ? white, raised, somewhat fixed plaques esophageal mucosa concerning for possible Candida esophagitis s/p brushing. Multiple gastric antral ulcerations, multiple small bulbar erosions and ulcerations.   ? ESOPHAGOGASTRODUODENOSCOPY N/A 03/05/2018  ? Procedure: ESOPHAGOGASTRODUODENOSCOPY (EGD);  Surgeon: Danie Binder, MD;  [facility];  Service: Endoscopy;  Laterality: N/A;  ? ESOPHAGOGASTRODUODENOSCOPY N/A 03/06/2018  ? Procedure: ESOPHAGOGASTRODUODENOSCOPY (EGD);  Surgeon: Danie Binder, MD;  [facility];  Service: Endoscopy;  Laterality: N/A;  ? TOOTH EXTRACTION N/A 02/01/2017  ? Procedure: FULL DENTAL EXTRACTIONS;  Surgeon: Diona Browner, DDS;  Location: [place];  Service: Oral Surgery;  Laterality: N/A;  ? TUBAL LIGATION    ? ?Social History  ? ?Socioeconomic History  ? Marital status: Single  ?  Spouse name: Not on file  ? Number of children: Not on file  ? Years of education: Not on  file  ? Highest education level: Not on file  ?Occupational History  ? Not on file  ?Tobacco Use  ? Smoking status: Every Day  ?  Packs/day: 0.50  ?  Years: 15.00  ?  Pack years: 7.50  ?  Types: Cigarettes  ? Smokeless tobacco: Never  ?Vaping Use  ? Vaping Use: Never used  ?Substance and Sexual Activity  ? Alcohol use: No  ? Drug use: No  ? Sexual activity: Not Currently  ?Other Topics Concern  ? Not on file  ?Social History Narrative  ? Not on file  ? ?Social Determinants of Health  ? ?Financial Resource Strain: Not on file  ?Food Insecurity: Not on file  ?Transportation Needs: Not on file  ?Physical Activity: Not on file  ?Stress: Not on file  ?Social Connections: Not on file  ? ?Outpatient Encounter Medications as of 11/09/2021  ?Medication Sig  ? amLODipine (NORVASC) 10 MG tablet Take 1 tablet (10 mg total) by mouth daily.  ? atorvastatin (LIPITOR) 40 MG tablet Take 1 tablet (40 mg total) by mouth daily.  ? Blood Glucose Monitoring Suppl (TRUE METRIX METER) w/Device KIT Use as directed to check blood sugar at home.  ? Cholecalciferol (  VITAMIN D) 125 MCG (5000 UT) CAPS Take 5,000 Int'l Units/kg by mouth daily.  ? Coenzyme Q10 (CO Q 10 PO) Take by mouth daily.  ? diclofenac Sodium (VOLTAREN) 1 % GEL Apply 2 g topically 4 (four) times daily.  ? fluconazole (DIFLUCAN) 150 MG tablet Take 1 tablet (100 mg) by mouth on day 1 and second tablet (100 mg) on day 2  ? fluticasone (FLONASE) 50 MCG/ACT nasal spray Place 2 sprays into both nostrils daily.  ? glucose blood (TRUE METRIX BLOOD GLUCOSE TEST) test strip Use as instructed  ? hydrALAZINE (APRESOLINE) 25 MG tablet Take 1 tablet by mouth twice daily  ? loratadine (CLARITIN) 10 MG tablet TAKE 1 TABLET BY MOUTH ONCE DAILY AS NEEDED FOR ALLERGIES  ? losartan-hydrochlorothiazide (HYZAAR) 100-25 MG tablet Take 1 tablet by mouth daily.  ? metoprolol tartrate (LOPRESSOR) 50 MG tablet Take 1 tablet (50 mg total) by mouth 2 (two) times daily.  ? Omega-3 Fatty Acids (FISH OIL PO)  Take by mouth 2 (two) times a day.  ? pantoprazole (PROTONIX) 40 MG tablet Take 1 tablet (40 mg total) by mouth daily.  ? TRUEPLUS LANCETS 28G MISC Use as directed.  ? [DISCONTINUED] glipiZIDE (GLUCOTROL XL) 5 MG 24 hr tablet Take 1 tablet (5 mg total) by mouth daily with breakfast.  ? [DISCONTINUED] levothyroxine (EUTHYROX) 50 MCG tablet TAKE 1 TABLET BY MOUTH ONCE DAILY BEFORE BREAKFAST  ? glipiZIDE (GLUCOTROL XL) 5 MG 24 hr tablet Take 1 tablet (5 mg total) by mouth daily with breakfast.  ? levothyroxine (EUTHYROX) 50 MCG tablet TAKE 1 TABLET BY MOUTH ONCE DAILY BEFORE BREAKFAST  ? ?No facility-administered encounter medications on file as of 11/09/2021.  ? ? ?ALLERGIES: ?Allergies  ?Allergen Reactions  ? Aspirin Other (See Comments)  ?  Mouth, upper lip, nose area becomes numb  ? Morphine And Related Other (See Comments)  ?  "Did not feel good"  ? Other   ?  Cats  ? Peach Flavor   ?  Mouth and body itches.   ? Penicillins Other (See Comments)  ?  Unknown ?Has patient had a PCN reaction causing immediate rash, facial/tongue/throat swelling, SOB or lightheadedness with hypotension: Unknown ?Has patient had a PCN reaction causing severe rash involving mucus membranes or skin necrosis: Unknown ?Has patient had a PCN reaction that required hospitalization: No ?Has patient had a PCN reaction occurring within the last 10 years: No ?If all of the above answers are "NO", then may proceed with Cephalosporin use. ?  ? ? ?VACCINATION STATUS: ?Immunization History  ?Administered Date(s) Administered  ? Influenza Whole 05/15/2019  ? Influenza,inj,Quad PF,6+ Mos 09/10/2018  ? Pneumococcal Polysaccharide-23 09/10/2018  ? Tdap 09/10/2018  ? ? ?Diabetes ?She presents for her follow-up diabetic visit. She has type 2 diabetes mellitus. Onset time: She reports that she was diagnosed at approximate age of 3 years. Her disease course has been stable. Hypoglycemia symptoms include sweats and tremors. Pertinent negatives for  hypoglycemia include no confusion, headaches, pallor or seizures. Associated symptoms include fatigue. Pertinent negatives for diabetes include no blurred vision, no chest pain, no polydipsia, no polyphagia and no polyuria. There are no hypoglycemic complications. Symptoms are stable. Diabetic complications include nephropathy. Risk factors for coronary artery disease include diabetes mellitus, dyslipidemia, family history, hypertension, sedentary lifestyle, tobacco exposure and post-menopausal. Current diabetic treatment includes oral agent (monotherapy). She is compliant with treatment most of the time. Her weight is fluctuating minimally. She is following a generally unhealthy diet. When asked  about meal planning, she reported none. She has not had a previous visit with a dietitian. She rarely participates in exercise. Her overall blood glucose range is 110-130 mg/dl. (She presents today with no meter or logs to review.  She does note she is taking it at home, reports readings ranging between 80-120 fasting.  Her POCT A1c today is 5.8%, essentially unchanged from previous visit.  She does have some episodes of hypoglycemia, associated with meal timing.) An ACE inhibitor/angiotensin II receptor blocker is being taken. She does not see a podiatrist.Eye exam is current.  ?Hyperlipidemia ?This is a chronic problem. The current episode started more than 1 year ago. The problem is uncontrolled. Recent lipid tests were reviewed and are high. Exacerbating diseases include chronic renal disease, diabetes and hypothyroidism. Factors aggravating her hyperlipidemia include beta blockers, fatty foods, thiazides and smoking. Pertinent negatives include no chest pain, myalgias or shortness of breath. Current antihyperlipidemic treatment includes statins (cannot remember if she has been taking it lately). The current treatment provides mild improvement of lipids. Compliance problems include adherence to exercise and adherence to  diet.  Risk factors for coronary artery disease include family history, dyslipidemia, diabetes mellitus, hypertension, a sedentary lifestyle and post-menopausal.  ?Hypertension ?This is a chronic problem. The c

## 2021-11-22 ENCOUNTER — Other Ambulatory Visit: Payer: Self-pay | Admitting: Nurse Practitioner

## 2021-11-22 DIAGNOSIS — M25571 Pain in right ankle and joints of right foot: Secondary | ICD-10-CM

## 2021-11-22 NOTE — Telephone Encounter (Signed)
I sent voltaren gel when she was here last month for her ankle.  I would not likely prescribe pain medication unless there is a fracture. She would need an xray to determine this first. She can have it done at [facility] at her convenience. ?

## 2021-11-22 NOTE — Telephone Encounter (Signed)
Pt made an additional call wanting pain medication sent in for her foot pain, please advise.  ?

## 2021-11-23 NOTE — Telephone Encounter (Signed)
Called pt unable to reach left VM to call back ?

## 2021-11-29 NOTE — Telephone Encounter (Signed)
Called pt made aware of NP message and instructions. Verbalized understanding and agreement .  ?

## 2021-12-11 LAB — HM DIABETES EYE EXAM

## 2021-12-15 ENCOUNTER — Ambulatory Visit: Payer: 59 | Admitting: Pharmacist

## 2021-12-26 ENCOUNTER — Other Ambulatory Visit: Payer: Self-pay | Admitting: Nurse Practitioner

## 2021-12-26 DIAGNOSIS — I10 Essential (primary) hypertension: Secondary | ICD-10-CM

## 2021-12-26 NOTE — Telephone Encounter (Signed)
Pt is calling back requesting an update on medication refill. Pt stated she has one tablet left.  ?

## 2022-01-02 ENCOUNTER — Telehealth: Payer: Self-pay | Admitting: Nurse Practitioner

## 2022-01-02 ENCOUNTER — Other Ambulatory Visit: Payer: Self-pay | Admitting: Pharmacist

## 2022-01-02 ENCOUNTER — Ambulatory Visit: Payer: 59 | Admitting: Pharmacist

## 2022-01-02 DIAGNOSIS — I10 Essential (primary) hypertension: Secondary | ICD-10-CM

## 2022-01-02 MED ORDER — LOSARTAN POTASSIUM-HCTZ 100-25 MG PO TABS: 1.0000 | ORAL_TABLET | Freq: Every day | ORAL | 0 refills | Status: DC

## 2022-01-02 MED ORDER — HYDRALAZINE HCL 25 MG PO TABS: 25.0000 mg | ORAL_TABLET | Freq: Two times a day (BID) | ORAL | 0 refills | Status: DC

## 2022-01-02 MED ORDER — METOPROLOL TARTRATE 50 MG PO TABS: 50.0000 mg | ORAL_TABLET | Freq: Two times a day (BID) | ORAL | 0 refills | Status: DC

## 2022-01-02 MED ORDER — AMLODIPINE BESYLATE 10 MG PO TABS: 10.0000 mg | ORAL_TABLET | Freq: Every day | ORAL | 0 refills | Status: DC

## 2022-01-02 NOTE — Telephone Encounter (Signed)
Patient came late for her appointment and had to be rescheduled and asked if she can get her BP medication refilled until next appointment with Zelda. She's been off her medications for two weeks.

## 2022-01-02 NOTE — Telephone Encounter (Signed)
Refills sent for antihypertensives.

## 2022-02-05 ENCOUNTER — Ambulatory Visit
Admission: RE | Admit: 2022-02-05 | Discharge: 2022-02-05 | Disposition: A | Discharge status: Home / Self Care | Payer: 59 | Source: Ambulatory Visit | Attending: Nurse Practitioner | Admitting: Nurse Practitioner

## 2022-02-05 ENCOUNTER — Encounter: Payer: Self-pay | Admitting: Nurse Practitioner

## 2022-02-05 ENCOUNTER — Ambulatory Visit: Payer: 59 | Attending: Nurse Practitioner | Admitting: Nurse Practitioner

## 2022-02-05 VITALS — BP 171/76 | HR 94 | Temp 98.0°F | Ht 68.0 in | Wt 168.8 lb

## 2022-02-05 DIAGNOSIS — Z23 Encounter for immunization: Secondary | ICD-10-CM | POA: Diagnosis not present

## 2022-02-05 DIAGNOSIS — E1122 Type 2 diabetes mellitus with diabetic chronic kidney disease: Secondary | ICD-10-CM | POA: Diagnosis not present

## 2022-02-05 DIAGNOSIS — E782 Mixed hyperlipidemia: Secondary | ICD-10-CM | POA: Diagnosis not present

## 2022-02-05 DIAGNOSIS — M25571 Pain in right ankle and joints of right foot: Secondary | ICD-10-CM

## 2022-02-05 DIAGNOSIS — N1831 Chronic kidney disease, stage 3a: Secondary | ICD-10-CM

## 2022-02-05 DIAGNOSIS — I10 Essential (primary) hypertension: Secondary | ICD-10-CM

## 2022-02-05 DIAGNOSIS — M19071 Primary osteoarthritis, right ankle and foot: Secondary | ICD-10-CM | POA: Diagnosis not present

## 2022-02-05 MED ORDER — LOSARTAN POTASSIUM-HCTZ 100-25 MG PO TABS: 1.0000 | ORAL_TABLET | Freq: Every day | ORAL | 0 refills | Status: DC

## 2022-02-05 MED ORDER — HYDRALAZINE HCL 50 MG PO TABS: 50.0000 mg | ORAL_TABLET | Freq: Two times a day (BID) | ORAL | 1 refills | Status: DC

## 2022-02-05 MED ORDER — METOPROLOL TARTRATE 50 MG PO TABS: 50.0000 mg | ORAL_TABLET | Freq: Two times a day (BID) | ORAL | 1 refills | Status: DC

## 2022-02-05 MED ORDER — ATORVASTATIN CALCIUM 40 MG PO TABS: 40.0000 mg | ORAL_TABLET | Freq: Every day | ORAL | 3 refills | Status: DC

## 2022-02-05 NOTE — Progress Notes (Signed)
Assessment & Plan:  [name] was seen today for hypertension.  Diagnoses and all orders for this visit:  Essential hypertension -     hydrALAZINE (APRESOLINE) 50 MG tablet; Take 1 tablet (50 mg total) by mouth 2 (two) times daily. -     losartan-hydrochlorothiazide (HYZAAR) 100-25 MG tablet; Take 1 tablet by mouth daily. -     metoprolol tartrate (LOPRESSOR) 50 MG tablet; Take 1 tablet (50 mg total) by mouth 2 (two) times daily. -     CMP14+EGFR  Mixed hyperlipidemia -     atorvastatin (LIPITOR) 40 MG tablet; Take 1 tablet (40 mg total) by mouth daily. -     Lipid panel  Type 2 diabetes mellitus with stage 3a chronic kidney disease, without long-term current use of insulin (HCC) -     Hemoglobin A1c  Need for shingles vaccine -     Varicella-zoster vaccine IM    Patient has been counseled on age-appropriate routine health concerns for screening and prevention. These are reviewed and up-to-date. Referrals have been placed accordingly. Immunizations are up-to-date or declined.    Subjective:   Chief Complaint  Patient presents with   Hypertension   HPI Sydney Morrow 61 y.o. female presents to office today for follow up to HTN and DM.   Overdue for breast exam. I have given her the number to the imaging center to schedule.    Blood pressure is poorly controlled. States She took her blood pressure medication late today. Does not have a blood pressure machine . Has not been taking hydralzine as prescribed. Will increase hydralazine to 50 mg BID from 25 mg BID. She will continue on hyzaar 100-25 mg daily, amlodipine 10 mg daily and lopressor 50 mg BID. She does continue to smoke cigarettes daily.  BP Readings from Last 3 Encounters:  02/05/22 (!) 171/76  11/09/21 (!) 142/62  11/03/21 (!) 168/74     DM 2 Well controlled with glipizide XL 5 mg daily Lab Results  Component Value Date   HGBA1C 6.1 (H) 02/05/2022   Lipids not at goal. I believe there is dietary and medication  nonadherence. She is prescribed atorvastatin 40 mg daily.  Lab Results  Component Value Date   LDLCALC 177 (H) 02/05/2022    Review of Systems  Constitutional:  Negative for fever, malaise/fatigue and weight loss.  HENT: Negative.  Negative for nosebleeds.   Eyes: Negative.  Negative for blurred vision, double vision and photophobia.  Respiratory: Negative.  Negative for cough and shortness of breath.   Cardiovascular: Negative.  Negative for chest pain, palpitations and leg swelling.  Gastrointestinal: Negative.  Negative for heartburn, nausea and vomiting.  Musculoskeletal: Negative.  Negative for myalgias.  Neurological: Negative.  Negative for dizziness, focal weakness, seizures and headaches.  Psychiatric/Behavioral: Negative.  Negative for suicidal ideas.     Past Medical History:  Diagnosis Date   Acid reflux    Diabetes (HCC)    History of anemia    History of hiatal hernia    Hypercholesterolemia    Hypertension    Jaw inflammation, right 01/30/2017    Past Surgical History:  Procedure Laterality Date   COLONOSCOPY N/A 03/05/2018   Procedure: COLONOSCOPY;  Surgeon: Danie Binder, MD;  [facility];  Service: Endoscopy;  Laterality: N/A;  12:00pm   COLONOSCOPY N/A 03/06/2018   Procedure: COLONOSCOPY;  Surgeon: Danie Binder, MD;  [facility];  Service: Endoscopy;  Laterality: N/A;   DENTAL SURGERY  ESOPHAGOGASTRODUODENOSCOPY  2011   white, raised, somewhat fixed plaques esophageal mucosa concerning for possible Candida esophagitis s/p brushing. Multiple gastric antral ulcerations, multiple small bulbar erosions and ulcerations.    ESOPHAGOGASTRODUODENOSCOPY N/A 03/05/2018   Procedure: ESOPHAGOGASTRODUODENOSCOPY (EGD);  Surgeon: Danie Binder, MD;  [facility];  Service: Endoscopy;  Laterality: N/A;   ESOPHAGOGASTRODUODENOSCOPY N/A 03/06/2018   Procedure: ESOPHAGOGASTRODUODENOSCOPY (EGD);  Surgeon: Danie Binder, MD;  [facility];  Service: Endoscopy;  Laterality: N/A;   TOOTH EXTRACTION N/A 02/01/2017   Procedure: FULL DENTAL EXTRACTIONS;  Surgeon: Diona Browner, DDS;  Location: [facility];  Service: Oral Surgery;  Laterality: N/A;   TUBAL LIGATION      Family History  Problem Relation Age of Onset   Breast cancer Mother    Colon cancer Neg Hx    Colon polyps Neg Hx     Social History Reviewed with no changes to be made today.   Outpatient Medications Prior to Visit  Medication Sig Dispense Refill   amLODipine (NORVASC) 10 MG tablet Take 1 tablet (10 mg total) by mouth daily. 90 tablet 0   Blood Glucose Monitoring Suppl (TRUE METRIX METER) w/Device KIT Use as directed to check blood sugar at home. 1 kit 0   Cholecalciferol (VITAMIN D) 125 MCG (5000 UT) CAPS Take 5,000 Int'l Units/kg by mouth daily.     Coenzyme Q10 (CO Q 10 PO) Take by mouth daily.     fluconazole (DIFLUCAN) 150 MG tablet Take 1 tablet (100 mg) by mouth on day 1 and second tablet (100 mg) on day 2 2 tablet 0   glipiZIDE (GLUCOTROL XL) 5 MG 24 hr tablet Take 1 tablet (5 mg total) by mouth daily with breakfast. 90 tablet 3   glucose blood (TRUE METRIX BLOOD GLUCOSE TEST) test strip Use as instructed 100 each 12   levothyroxine (EUTHYROX) 50 MCG tablet TAKE 1 TABLET BY MOUTH ONCE DAILY BEFORE BREAKFAST 90 tablet 3   loratadine (CLARITIN) 10 MG tablet TAKE 1 TABLET BY MOUTH ONCE DAILY AS NEEDED FOR ALLERGIES 30 tablet 2   Omega-3 Fatty Acids (FISH OIL PO) Take by mouth 2 (two) times a day.     TRUEPLUS LANCETS 28G MISC Use as directed. 100 each 11   hydrALAZINE (APRESOLINE) 25 MG tablet Take 1 tablet (25 mg total) by mouth 2 (two) times daily. 180 tablet 0   losartan-hydrochlorothiazide (HYZAAR) 100-25 MG tablet Take 1 tablet by mouth daily. 90 tablet 0   metoprolol tartrate (LOPRESSOR) 50 MG tablet Take 1 tablet (50 mg total) by mouth 2 (two) times daily. 180 tablet 0   fluticasone (FLONASE) 50 MCG/ACT nasal spray Place 2 sprays into both  nostrils daily. 16 g 1   pantoprazole (PROTONIX) 40 MG tablet Take 1 tablet (40 mg total) by mouth daily. 90 tablet 3   atorvastatin (LIPITOR) 40 MG tablet Take 1 tablet (40 mg total) by mouth daily. 90 tablet 0   No facility-administered medications prior to visit.    Allergies  Allergen Reactions   Aspirin Other (See Comments)    Mouth, upper lip, nose area becomes numb   Morphine And Related Other (See Comments)    "Did not feel good"   Other     Cats   Peach Flavor     Mouth and body itches.    Penicillins Other (See Comments)    Unknown Has patient had a PCN reaction causing immediate rash, facial/tongue/throat swelling, SOB or lightheadedness with hypotension:  Unknown Has patient had a PCN reaction causing severe rash involving mucus membranes or skin necrosis: Unknown Has patient had a PCN reaction that required hospitalization: No Has patient had a PCN reaction occurring within the last 10 years: No If all of the above answers are "NO", then may proceed with Cephalosporin use.        Objective:    BP (!) 171/76   Pulse 94   Temp 98 F (36.7 C) (Oral)   Ht _0  (1.727 m)   Wt 168 lb 12.8 oz (76.6 kg)   LMP 08/13/2014 (Approximate)   SpO2 98%   BMI 25.67 kg/m  Wt Readings from Last 3 Encounters:  02/05/22 168 lb 12.8 oz (76.6 kg)  11/09/21 169 lb 6.4 oz (76.8 kg)  11/03/21 170 lb 9.6 oz (77.4 kg)    Physical Exam Vitals and nursing note reviewed.  Constitutional:      Appearance: She is well-developed.  HENT:     Head: Normocephalic and atraumatic.  Cardiovascular:     Rate and Rhythm: Normal rate and regular rhythm.     Heart sounds: Normal heart sounds. No murmur heard.    No friction rub. No gallop.  Pulmonary:     Effort: Pulmonary effort is normal. No tachypnea or respiratory distress.     Breath sounds: Normal breath sounds. No decreased breath sounds, wheezing, rhonchi or rales.  Chest:     Chest wall: No tenderness.  Abdominal:     General:  Bowel sounds are normal.     Palpations: Abdomen is soft.  Musculoskeletal:        General: Normal range of motion.     Cervical back: Normal range of motion.  Skin:    General: Skin is warm and dry.  Neurological:     Mental Status: She is alert and oriented to person, place, and time.     Coordination: Coordination normal.  Psychiatric:        Behavior: Behavior normal. Behavior is cooperative.        Thought Content: Thought content normal.        Judgment: Judgment normal.          Patient has been counseled extensively about nutrition and exercise as well as the importance of adherence with medications and regular follow-up. The patient was given clear instructions to go to ER or return to medical center if symptoms don't improve, worsen or new problems develop. The patient verbalized understanding.   Follow-up: Return for follow up with luke for BP check in 3 weeks. See me in 3 months.   Gildardo Pounds, FNP-BC [facility] [place], [place]   02/13/2022, 8:32 AM

## 2022-02-06 LAB — CMP14+EGFR
ALT: 16 IU/L (ref 0–32)
AST: 12 IU/L (ref 0–40)
Albumin/Globulin Ratio: 1.4 (ref 1.2–2.2)
Albumin: 4.4 g/dL (ref 3.8–4.8)
Alkaline Phosphatase: 117 IU/L (ref 44–121)
BUN/Creatinine Ratio: 18 (ref 12–28)
BUN: 35 mg/dL — ABNORMAL HIGH (ref 8–27)
Bilirubin Total: 0.2 mg/dL (ref 0.0–1.2)
CO2: 20 mmol/L (ref 20–29)
Calcium: 10.1 mg/dL (ref 8.7–10.3)
Chloride: 105 mmol/L (ref 96–106)
Creatinine, Ser: 1.97 mg/dL — ABNORMAL HIGH (ref 0.57–1.00)
Globulin, Total: 3.1 g/dL (ref 1.5–4.5)
Glucose: 136 mg/dL — ABNORMAL HIGH (ref 70–99)
Potassium: 4.8 mmol/L (ref 3.5–5.2)
Sodium: 140 mmol/L (ref 134–144)
Total Protein: 7.5 g/dL (ref 6.0–8.5)
eGFR: 28 mL/min/{1.73_m2} — ABNORMAL LOW (ref >59)

## 2022-02-06 LAB — LIPID PANEL
Chol/HDL Ratio: 4.4 ratio (ref 0.0–4.4)
Cholesterol, Total: 264 mg/dL — ABNORMAL HIGH (ref 100–199)
HDL: 60 mg/dL (ref >39)
LDL Chol Calc (NIH): 177 mg/dL — ABNORMAL HIGH (ref 0–99)
Triglycerides: 149 mg/dL (ref 0–149)
VLDL Cholesterol Cal: 27 mg/dL (ref 5–40)

## 2022-02-06 LAB — HEMOGLOBIN A1C
Est. average glucose Bld gHb Est-mCnc: 128 mg/dL
Hgb A1c MFr Bld: 6.1 % — ABNORMAL HIGH (ref 4.8–5.6)

## 2022-02-08 ENCOUNTER — Telehealth: Payer: Self-pay | Admitting: *Deleted

## 2022-02-08 ENCOUNTER — Ambulatory Visit: Payer: Self-pay | Admitting: Pharmacist

## 2022-02-08 ENCOUNTER — Other Ambulatory Visit: Payer: Self-pay | Admitting: Nurse Practitioner

## 2022-02-08 DIAGNOSIS — M79671 Pain in right foot: Secondary | ICD-10-CM

## 2022-02-08 NOTE — Telephone Encounter (Signed)
Patient request medication for pain. States the ointment that was recommended does not help.

## 2022-02-08 NOTE — Telephone Encounter (Signed)
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  A1c still in prediabetes range   Liver function and electrolytes normal.  Kidney function is stable.  Make sure you continue to follow-up with the kidney doctor as scheduled.

## 2022-02-08 NOTE — Telephone Encounter (Signed)
I have referred her to podiatry since she can't take ibuprofen or meloxicam and I do not have any abnormal imaging to justify prescription pain meds.

## 2022-02-09 NOTE — Telephone Encounter (Signed)
Called to inform patient of message per PCP.   LVM to return call.   Phone number not up to date on DPR.

## 2022-02-13 ENCOUNTER — Encounter: Payer: Self-pay | Admitting: Nurse Practitioner

## 2022-02-14 NOTE — Telephone Encounter (Signed)
Pt aware of referral to [facility]. Given number to call if they do not reach out to her.

## 2022-03-09 ENCOUNTER — Ambulatory Visit: Payer: 59 | Admitting: Pharmacist

## 2022-03-13 ENCOUNTER — Ambulatory Visit: Payer: 59 | Admitting: Pharmacist

## 2022-03-13 ENCOUNTER — Ambulatory Visit: Payer: 59 | Admitting: Nurse Practitioner

## 2022-03-13 DIAGNOSIS — E039 Hypothyroidism, unspecified: Secondary | ICD-10-CM

## 2022-03-13 DIAGNOSIS — N184 Chronic kidney disease, stage 4 (severe): Secondary | ICD-10-CM

## 2022-03-13 DIAGNOSIS — E782 Mixed hyperlipidemia: Secondary | ICD-10-CM

## 2022-03-13 DIAGNOSIS — I10 Essential (primary) hypertension: Secondary | ICD-10-CM

## 2022-03-14 LAB — T4, FREE: Free T4: 1.47 ng/dL (ref 0.82–1.77)

## 2022-03-14 LAB — TSH: TSH: 1.3 u[IU]/mL (ref 0.450–4.500)

## 2022-04-02 ENCOUNTER — Other Ambulatory Visit: Payer: Self-pay | Admitting: Family Medicine

## 2022-04-02 DIAGNOSIS — J3089 Other allergic rhinitis: Secondary | ICD-10-CM

## 2022-04-03 NOTE — Telephone Encounter (Signed)
Requested Prescriptions  Pending Prescriptions Disp Refills  . fluticasone (FLONASE) 50 MCG/ACT nasal spray [Pharmacy Med Name: Fluticasone Propionate 50 MCG/ACT Nasal Suspension] 16 g 0    Sig: Use 2 spray(s) in each nostril once daily     Ear, Nose, and Throat: Nasal Preparations - Corticosteroids Passed - 04/02/2022  2:04 PM      Passed - Valid encounter within last 12 months    Recent Outpatient Visits          1 month ago Essential hypertension   [place], Vernia Buff, NP   5 months ago Encounter for Papanicolaou smear for cervical cancer screening   [place], Vernia Buff, NP   1 year ago Essential hypertension   [place], RPH-CPP   1 year ago Essential hypertension   [place], Vernia Buff, NP   1 year ago Essential hypertension   [place], Vernia Buff, NP      Future Appointments            In 1 month Gildardo Pounds, NP [place]

## 2022-05-09 ENCOUNTER — Other Ambulatory Visit: Payer: Self-pay | Admitting: Nurse Practitioner

## 2022-05-09 ENCOUNTER — Ambulatory Visit: Payer: 59 | Admitting: Nurse Practitioner

## 2022-05-09 DIAGNOSIS — J3089 Other allergic rhinitis: Secondary | ICD-10-CM

## 2022-05-09 NOTE — Telephone Encounter (Signed)
Requested Prescriptions  Pending Prescriptions Disp Refills  . fluticasone (FLONASE) 50 MCG/ACT nasal spray [Pharmacy Med Name: Fluticasone Propionate 50 MCG/ACT Nasal Suspension] 16 g 0    Sig: Use 2 spray(s) in each nostril once daily     Ear, Nose, and Throat: Nasal Preparations - Corticosteroids Passed - 05/09/2022  1:43 PM      Passed - Valid encounter within last 12 months    Recent Outpatient Visits          3 months ago Essential hypertension   [place], Vernia Buff, NP   6 months ago Encounter for Papanicolaou smear for cervical cancer screening   [place], Vernia Buff, NP   1 year ago Essential hypertension   [place], Jarome Matin, RPH-CPP   1 year ago Essential hypertension   [place], Vernia Buff, NP   1 year ago Essential hypertension   [place], Vernia Buff, NP

## 2022-09-18 ENCOUNTER — Ambulatory Visit: Payer: Self-pay | Admitting: Nurse Practitioner

## 2022-09-18 ENCOUNTER — Other Ambulatory Visit: Payer: Self-pay | Admitting: Nurse Practitioner

## 2022-09-18 DIAGNOSIS — E782 Mixed hyperlipidemia: Secondary | ICD-10-CM

## 2022-09-18 DIAGNOSIS — I10 Essential (primary) hypertension: Secondary | ICD-10-CM

## 2022-09-18 NOTE — Telephone Encounter (Signed)
Medication Refill - Medication: atorvastatin (LIPITOR) 40 MG tablet  hydrALAZINE (APRESOLINE) 50 MG tablet [3701]  Has the patient contacted their pharmacy? Yes.   (Agent: If no, request that the patient contact the pharmacy for the refill. If patient does not wish to contact the pharmacy document the reason why and proceed with request.) (Agent: If yes, when and what did the pharmacy advise?)  Preferred Pharmacy (with phone number or street name):   [place], [place] - [place] [address]  [address] [place] [address]  Phone: [phone] Fax: [phone]   Has the patient been seen for an appointment in the last year OR does the patient have an upcoming appointment? Yes.    Agent: Please be advised that RX refills may take up to 3 business days. We ask that you follow-up with your pharmacy.

## 2022-09-19 MED ORDER — HYDRALAZINE HCL 50 MG PO TABS: 50.0000 mg | ORAL_TABLET | Freq: Two times a day (BID) | ORAL | 0 refills | Status: DC

## 2022-09-19 NOTE — Telephone Encounter (Signed)
Atorvastatin Rx -02/05/22 #90 3RF 1 yr supply Requested Prescriptions  Pending Prescriptions Disp Refills   hydrALAZINE (APRESOLINE) 50 MG tablet 180 tablet 0    Sig: Take 1 tablet (50 mg total) by mouth 2 (two) times daily.     Cardiovascular:  Vasodilators Failed - 09/18/2022 10:34 AM      Failed - ANA Screen, Ifa, Serum in normal range and within 360 days    No results found for: "ANA", "ANATITER", "LABANTI"       Failed - Last BP in normal range    BP Readings from Last 1 Encounters:  02/05/22 (!) 171/76         Passed - HCT in normal range and within 360 days    Hematocrit  Date Value Ref Range Status  11/03/2021 43.2 34.0 - 46.6 % Final         Passed - HGB in normal range and within 360 days    Hemoglobin  Date Value Ref Range Status  11/03/2021 14.8 11.1 - 15.9 g/dL Final         Passed - RBC in normal range and within 360 days    RBC  Date Value Ref Range Status  11/03/2021 4.66 3.77 - 5.28 x10E6/uL Final  03/06/2018 4.57 3.87 - 5.11 MIL/uL Final         Passed - WBC in normal range and within 360 days    WBC  Date Value Ref Range Status  11/03/2021 5.7 3.4 - 10.8 x10E3/uL Final  03/06/2018 6.4 4.0 - 10.5 K/uL Final         Passed - PLT in normal range and within 360 days    Platelets  Date Value Ref Range Status  11/03/2021 209 150 - 450 x10E3/uL Final         Passed - Valid encounter within last 12 months    Recent Outpatient Visits           7 months ago Essential hypertension   [place], Vernia Buff, NP   10 months ago Encounter for Papanicolaou smear for cervical cancer screening   [place], Vernia Buff, NP   1 year ago Essential hypertension   [place], Jarome Matin, RPH-CPP   1 year ago Essential hypertension   [place], Vernia Buff, NP   2 years ago Essential hypertension    [place] Gildardo Pounds, NP       Future Appointments             In 1 month Gildardo Pounds, NP [place]            Refused Prescriptions Disp Refills   atorvastatin (LIPITOR) 40 MG tablet 90 tablet 3    Sig: Take 1 tablet (40 mg total) by mouth daily.     Cardiovascular:  Antilipid - Statins Failed - 09/18/2022 10:34 AM      Failed - Lipid Panel in normal range within the last 12 months    Cholesterol, Total  Date Value Ref Range Status  02/05/2022 264 (H) 100 - 199 mg/dL Final   LDL Chol Calc (NIH)  Date Value Ref Range Status  02/05/2022 177 (H) 0 - 99 mg/dL Final   HDL  Date Value Ref Range Status  02/05/2022 60 >39 mg/dL Final  Triglycerides  Date Value Ref Range Status  02/05/2022 149 0 - 149 mg/dL Final         Passed - Patient is not pregnant      Passed - Valid encounter within last 12 months    Recent Outpatient Visits           7 months ago Essential hypertension   [place], Vernia Buff, NP   10 months ago Encounter for Papanicolaou smear for cervical cancer screening   [place], Vernia Buff, NP   1 year ago Essential hypertension   [place], RPH-CPP   1 year ago Essential hypertension   [place], Vernia Buff, NP   2 years ago Essential hypertension   [place] Gildardo Pounds, NP       Future Appointments             In 1 month Gildardo Pounds, NP [place]

## 2022-09-19 NOTE — Telephone Encounter (Signed)
Requested Prescriptions  Pending Prescriptions Disp Refills   hydrALAZINE (APRESOLINE) 50 MG tablet 180 tablet 0    Sig: Take 1 tablet (50 mg total) by mouth 2 (two) times daily.     Cardiovascular:  Vasodilators Failed - 09/18/2022 10:34 AM      Failed - ANA Screen, Ifa, Serum in normal range and within 360 days    No results found for: "ANA", "ANATITER", "LABANTI"       Failed - Last BP in normal range    BP Readings from Last 1 Encounters:  02/05/22 (!) 171/76         Passed - HCT in normal range and within 360 days    Hematocrit  Date Value Ref Range Status  11/03/2021 43.2 34.0 - 46.6 % Final         Passed - HGB in normal range and within 360 days    Hemoglobin  Date Value Ref Range Status  11/03/2021 14.8 11.1 - 15.9 g/dL Final         Passed - RBC in normal range and within 360 days    RBC  Date Value Ref Range Status  11/03/2021 4.66 3.77 - 5.28 x10E6/uL Final  03/06/2018 4.57 3.87 - 5.11 MIL/uL Final         Passed - WBC in normal range and within 360 days    WBC  Date Value Ref Range Status  11/03/2021 5.7 3.4 - 10.8 x10E3/uL Final  03/06/2018 6.4 4.0 - 10.5 K/uL Final         Passed - PLT in normal range and within 360 days    Platelets  Date Value Ref Range Status  11/03/2021 209 150 - 450 x10E3/uL Final         Passed - Valid encounter within last 12 months    Recent Outpatient Visits           7 months ago Essential hypertension   [place], Vernia Buff, NP   10 months ago Encounter for Papanicolaou smear for cervical cancer screening   [place], Vernia Buff, NP   1 year ago Essential hypertension   [place], Jarome Matin, RPH-CPP   1 year ago Essential hypertension   [place], Vernia Buff, NP   2 years ago Essential hypertension   [place] Gildardo Pounds, NP       Future Appointments             In 1 month Gildardo Pounds, NP [place]            Refused Prescriptions Disp Refills   atorvastatin (LIPITOR) 40 MG tablet 90 tablet 3    Sig: Take 1 tablet (40 mg total) by mouth daily.     Cardiovascular:  Antilipid - Statins Failed - 09/18/2022 10:34 AM      Failed - Lipid Panel in normal range within the last 12 months    Cholesterol, Total  Date Value Ref Range Status  02/05/2022 264 (H) 100 - 199 mg/dL Final   LDL Chol Calc (NIH)  Date Value Ref Range Status  02/05/2022 177 (H) 0 - 99 mg/dL Final   HDL  Date Value Ref Range Status  02/05/2022 60 >39 mg/dL Final   Triglycerides  Date Value Ref Range  Status  02/05/2022 149 0 - 149 mg/dL Final         Passed - Patient is not pregnant      Passed - Valid encounter within last 12 months    Recent Outpatient Visits           7 months ago Essential hypertension   [place] [employer], Vernia Buff, NP   10 months ago Encounter for Papanicolaou smear for cervical cancer screening   [place], Vernia Buff, NP   1 year ago Essential hypertension   [place], RPH-CPP   1 year ago Essential hypertension   [place], Vernia Buff, NP   2 years ago Essential hypertension   [place] Gildardo Pounds, NP       Future Appointments             In 1 month Gildardo Pounds, NP [place]

## 2022-10-12 DIAGNOSIS — Z419 Encounter for procedure for purposes other than remedying health state, unspecified: Secondary | ICD-10-CM | POA: Diagnosis not present

## 2022-10-15 DIAGNOSIS — E663 Overweight: Secondary | ICD-10-CM | POA: Diagnosis not present

## 2022-10-15 DIAGNOSIS — J019 Acute sinusitis, unspecified: Secondary | ICD-10-CM | POA: Diagnosis not present

## 2022-10-15 DIAGNOSIS — R030 Elevated blood-pressure reading, without diagnosis of hypertension: Secondary | ICD-10-CM | POA: Diagnosis not present

## 2022-10-15 DIAGNOSIS — Z6825 Body mass index (BMI) 25.0-25.9, adult: Secondary | ICD-10-CM | POA: Diagnosis not present

## 2022-10-19 DIAGNOSIS — J00 Acute nasopharyngitis [common cold]: Secondary | ICD-10-CM | POA: Diagnosis not present

## 2022-10-19 DIAGNOSIS — R030 Elevated blood-pressure reading, without diagnosis of hypertension: Secondary | ICD-10-CM | POA: Diagnosis not present

## 2022-10-30 ENCOUNTER — Encounter: Payer: Self-pay | Admitting: Nurse Practitioner

## 2022-10-30 ENCOUNTER — Ambulatory Visit: Payer: Commercial Managed Care - HMO | Attending: Nurse Practitioner | Admitting: Nurse Practitioner

## 2022-10-30 VITALS — BP 134/68 | HR 75 | Ht 68.0 in | Wt 162.4 lb

## 2022-10-30 DIAGNOSIS — E7800 Pure hypercholesterolemia, unspecified: Secondary | ICD-10-CM | POA: Diagnosis not present

## 2022-10-30 DIAGNOSIS — E1169 Type 2 diabetes mellitus with other specified complication: Secondary | ICD-10-CM | POA: Diagnosis not present

## 2022-10-30 DIAGNOSIS — E1122 Type 2 diabetes mellitus with diabetic chronic kidney disease: Secondary | ICD-10-CM | POA: Diagnosis not present

## 2022-10-30 DIAGNOSIS — I10 Essential (primary) hypertension: Secondary | ICD-10-CM

## 2022-10-30 DIAGNOSIS — N1831 Chronic kidney disease, stage 3a: Secondary | ICD-10-CM

## 2022-10-30 DIAGNOSIS — K219 Gastro-esophageal reflux disease without esophagitis: Secondary | ICD-10-CM

## 2022-10-30 DIAGNOSIS — J3089 Other allergic rhinitis: Secondary | ICD-10-CM

## 2022-10-30 DIAGNOSIS — J329 Chronic sinusitis, unspecified: Secondary | ICD-10-CM

## 2022-10-30 DIAGNOSIS — B9689 Other specified bacterial agents as the cause of diseases classified elsewhere: Secondary | ICD-10-CM

## 2022-10-30 DIAGNOSIS — Z1231 Encounter for screening mammogram for malignant neoplasm of breast: Secondary | ICD-10-CM

## 2022-10-30 DIAGNOSIS — E039 Hypothyroidism, unspecified: Secondary | ICD-10-CM | POA: Diagnosis not present

## 2022-10-30 MED ORDER — METOPROLOL TARTRATE 50 MG PO TABS: 50.0000 mg | ORAL_TABLET | Freq: Two times a day (BID) | ORAL | 1 refills | Status: DC

## 2022-10-30 MED ORDER — AMLODIPINE BESYLATE 10 MG PO TABS: 10.0000 mg | ORAL_TABLET | Freq: Every day | ORAL | 1 refills | Status: DC

## 2022-10-30 MED ORDER — PANTOPRAZOLE SODIUM 40 MG PO TBEC: 40.0000 mg | DELAYED_RELEASE_TABLET | Freq: Every day | ORAL | 3 refills | Status: DC

## 2022-10-30 MED ORDER — GLIPIZIDE ER 5 MG PO TB24: 5.0000 mg | ORAL_TABLET | Freq: Every day | ORAL | 3 refills | Status: DC

## 2022-10-30 MED ORDER — HYDRALAZINE HCL 50 MG PO TABS: 50.0000 mg | ORAL_TABLET | Freq: Two times a day (BID) | ORAL | 0 refills | Status: DC

## 2022-10-30 MED ORDER — AMLODIPINE BESYLATE 10 MG PO TABS: 10.0000 mg | ORAL_TABLET | Freq: Every day | ORAL | 0 refills | Status: DC

## 2022-10-30 MED ORDER — LOSARTAN POTASSIUM-HCTZ 100-25 MG PO TABS: 1.0000 | ORAL_TABLET | Freq: Every day | ORAL | 0 refills | Status: DC

## 2022-10-30 MED ORDER — LORATADINE 10 MG PO TABS: 10.0000 mg | ORAL_TABLET | Freq: Every day | ORAL | 3 refills | Status: DC

## 2022-10-30 MED ORDER — DOXYCYCLINE HYCLATE 100 MG PO TABS: 100.0000 mg | ORAL_TABLET | Freq: Two times a day (BID) | ORAL | 0 refills | Status: AC

## 2022-10-30 MED ORDER — ATORVASTATIN CALCIUM 40 MG PO TABS: 40.0000 mg | ORAL_TABLET | Freq: Every day | ORAL | 3 refills | Status: DC

## 2022-10-30 MED ORDER — FLUTICASONE PROPIONATE 50 MCG/ACT NA SUSP: 1.0000 | Freq: Every day | NASAL | 2 refills | Status: DC

## 2022-10-30 MED ORDER — TRUEPLUS LANCETS 28G MISC: 11 refills | Status: AC

## 2022-10-30 MED ORDER — TRUE METRIX BLOOD GLUCOSE TEST VI STRP: ORAL_STRIP | 12 refills | Status: DC

## 2022-10-30 NOTE — Patient Instructions (Addendum)
[facility] Address: [place], [place], [place] [address] Phone: [phone]   [facility] Address: [place], [place], [place] [address] Phone: [phone]

## 2022-10-30 NOTE — Progress Notes (Signed)
Assessment & Plan:  Sydney Morrow was seen today for diabetes and hypertension.  Diagnoses and all orders for this visit:  Type 2 diabetes mellitus with stage 3a chronic kidney disease, without long-term current use of insulin (HCC) -     Hemoglobin A1c -     CMP14+EGFR -     Microalbumin / creatinine urine ratio -     glipiZIDE (GLUCOTROL XL) 5 MG 24 hr tablet; Take 1 tablet (5 mg total) by mouth daily with breakfast. -     glucose blood (TRUE METRIX BLOOD GLUCOSE TEST) test strip; Use as instructed -     TRUEplus Lancets 28G MISC; Use as directed.  Acquired hypothyroidism -     Thyroid Panel With TSH  Type 2 diabetes mellitus with hypercholesterolemia (HCC) -     Lipid panel -     atorvastatin (LIPITOR) 40 MG tablet; Take 1 tablet (40 mg total) by mouth daily. INSTRUCTIONS: Work on a low fat, heart healthy diet and participate in regular aerobic exercise program by working out at least 150 minutes per week; 5 days a week-30 minutes per day. Avoid red meat/beef/steak,  fried foods. junk foods, sodas, sugary drinks, unhealthy snacking, alcohol and smoking.  Drink at least 80 oz of water per day and monitor your carbohydrate intake daily.    Primary hypertension -     hydrALAZINE (APRESOLINE) 50 MG tablet; Take 1 tablet (50 mg total) by mouth 2 (two) times daily. -     losartan-hydrochlorothiazide (HYZAAR) 100-25 MG tablet; Take 1 tablet by mouth daily. -     metoprolol tartrate (LOPRESSOR) 50 MG tablet; Take 1 tablet (50 mg total) by mouth 2 (two) times daily. -     amLODipine (NORVASC) 10 MG tablet; Take 1 tablet (10 mg total) by mouth daily.  GERD without esophagitis -     pantoprazole (PROTONIX) 40 MG tablet; Take 1 tablet (40 mg total) by mouth daily. INSTRUCTIONS: Avoid GERD Triggers: acidic, spicy or fried foods, caffeine, coffee, sodas,  alcohol and chocolate.    Breast cancer screening by mammogram -     MM DIGITAL SCREENING BILATERAL; Future  Bacterial sinusitis -      doxycycline (VIBRA-TABS) 100 MG tablet; Take 1 tablet (100 mg total) by mouth 2 (two) times daily for 10 days. FOR SINUS INFECTION -     fluticasone (FLONASE) 50 MCG/ACT nasal spray; Place 1 spray into both nostrils daily. Encouraged to stop smoking  Allergic rhinitis due to other allergic trigger, unspecified seasonality -     Ambulatory referral to Allergy She has persistent PND, rhinorrhea and congestion Claritin sent to pharmacy    Patient has been counseled on age-appropriate routine health concerns for screening and prevention. These are reviewed and up-to-date. Referrals have been placed accordingly. Immunizations are up-to-date or declined.    Subjective:   Chief Complaint  Patient presents with   Diabetes   Hypertension   HPI Sydney Morrow 62 y.o. female presents to office today for follow up to HTN and DM as well as symptoms of bacterial sinusitis.  She has a past medical history of Acid reflux, DM2, History of anemia, History of hiatal hernia, Hypercholesterolemia, Hypertension  Patient has been counseled on age-appropriate routine health concerns for screening and prevention. These are reviewed and up-to-date. Referrals have been placed accordingly. Immunizations are up-to-date or declined.     MAMMOGRAM: OVERDUE. Referrals have been placed several times.  COLONOSCOPY: UTD PAP SMEAR: UTD  HTN Well controlled with amlodipine,  hydralazine, hyzaar and lopressor.  BP Readings from Last 3 Encounters:  10/30/22 134/68  02/05/22 (!) 171/76  11/09/21 (!) 142/62    DM 2 Well controlled with glipizde 5 mg daily. Denies any symptoms of hyperglycemia Lab Results  Component Value Date   HGBA1C 6.1 (H) 02/05/2022     Sinus Pain: Patient complains of congestion, non productive cough, post nasal drip, purulent nasal discharge, and sinus pressure.  Onset of symptoms was a few weeks ago, unchanged since that time. She is drinking plenty of fluids.  Past history is significant  for  smoking and allergies .   Hypothyroidism Denies any symptoms of hypo or hyperthyroidism. Needs follow up with endocrinology Lab Results  Component Value Date   TSH 1.300 03/13/2022    Review of Systems  Constitutional:  Negative for fever, malaise/fatigue and weight loss.  HENT:  Positive for congestion and sinus pain. Negative for nosebleeds.   Eyes: Negative.  Negative for blurred vision, double vision and photophobia.  Respiratory:  Positive for cough. Negative for shortness of breath.   Cardiovascular: Negative.  Negative for chest pain, palpitations and leg swelling.  Gastrointestinal: Negative.  Negative for heartburn, nausea and vomiting.  Musculoskeletal: Negative.  Negative for myalgias.  Neurological: Negative.  Negative for dizziness, focal weakness, seizures and headaches.  Psychiatric/Behavioral: Negative.  Negative for suicidal ideas.     Past Medical History:  Diagnosis Date   Acid reflux    Diabetes (HCC)    History of anemia    History of hiatal hernia    Hypercholesterolemia    Hypertension    Jaw inflammation, right 01/30/2017    Past Surgical History:  Procedure Laterality Date   COLONOSCOPY N/A 03/05/2018   Procedure: COLONOSCOPY;  Surgeon: Danie Binder, MD;  [facility];  Service: Endoscopy;  Laterality: N/A;  12:00pm   COLONOSCOPY N/A 03/06/2018   Procedure: COLONOSCOPY;  Surgeon: Danie Binder, MD;  [facility];  Service: Endoscopy;  Laterality: N/A;   DENTAL SURGERY     ESOPHAGOGASTRODUODENOSCOPY  2011   white, raised, somewhat fixed plaques esophageal mucosa concerning for possible Candida esophagitis s/p brushing. Multiple gastric antral ulcerations, multiple small bulbar erosions and ulcerations.    ESOPHAGOGASTRODUODENOSCOPY N/A 03/05/2018   Procedure: ESOPHAGOGASTRODUODENOSCOPY (EGD);  Surgeon: Danie Binder, MD;  [facility];  Service: Endoscopy;  Laterality: N/A;   ESOPHAGOGASTRODUODENOSCOPY N/A  03/06/2018   Procedure: ESOPHAGOGASTRODUODENOSCOPY (EGD);  Surgeon: Danie Binder, MD;  [facility];  Service: Endoscopy;  Laterality: N/A;   TOOTH EXTRACTION N/A 02/01/2017   Procedure: FULL DENTAL EXTRACTIONS;  Surgeon: Diona Browner, DDS;  Location: [facility];  Service: Oral Surgery;  Laterality: N/A;   TUBAL LIGATION      Family History  Problem Relation Age of Onset   Breast cancer Mother    Colon cancer Neg Hx    Colon polyps Neg Hx     Social History Reviewed with no changes to be made today.   Outpatient Medications Prior to Visit  Medication Sig Dispense Refill   Blood Glucose Monitoring Suppl (TRUE METRIX METER) w/Device KIT Use as directed to check blood sugar at home. 1 kit 0   Cholecalciferol (VITAMIN D) 125 MCG (5000 UT) CAPS Take 5,000 Int'l Units/kg by mouth daily.     Coenzyme Q10 (CO Q 10 PO) Take by mouth daily.     levothyroxine (EUTHYROX) 50 MCG tablet TAKE 1 TABLET BY MOUTH ONCE DAILY BEFORE BREAKFAST 90 tablet  3   Omega-3 Fatty Acids (FISH OIL PO) Take by mouth 2 (two) times a day.     fluticasone (FLONASE) 50 MCG/ACT nasal spray Use 2 spray(s) in each nostril once daily 16 g 2   loratadine (CLARITIN) 10 MG tablet TAKE 1 TABLET BY MOUTH ONCE DAILY AS NEEDED FOR ALLERGIES 30 tablet 2   TRUEPLUS LANCETS 28G MISC Use as directed. 100 each 11   amLODipine (NORVASC) 10 MG tablet Take 1 tablet (10 mg total) by mouth daily. 90 tablet 0   atorvastatin (LIPITOR) 40 MG tablet Take 1 tablet (40 mg total) by mouth daily. 90 tablet 3   fluconazole (DIFLUCAN) 150 MG tablet Take 1 tablet (100 mg) by mouth on day 1 and second tablet (100 mg) on day 2 2 tablet 0   glipiZIDE (GLUCOTROL XL) 5 MG 24 hr tablet Take 1 tablet (5 mg total) by mouth daily with breakfast. 90 tablet 3   glucose blood (TRUE METRIX BLOOD GLUCOSE TEST) test strip Use as instructed 100 each 12   hydrALAZINE (APRESOLINE) 50 MG tablet Take 1 tablet (50 mg total) by mouth 2 (two) times daily. 180 tablet 0    losartan-hydrochlorothiazide (HYZAAR) 100-25 MG tablet Take 1 tablet by mouth daily. 90 tablet 0   metoprolol tartrate (LOPRESSOR) 50 MG tablet Take 1 tablet (50 mg total) by mouth 2 (two) times daily. (Patient not taking: Reported on 10/30/2022) 180 tablet 1   pantoprazole (PROTONIX) 40 MG tablet Take 1 tablet (40 mg total) by mouth daily. 90 tablet 3   No facility-administered medications prior to visit.    Allergies  Allergen Reactions   Aspirin Other (See Comments)    Mouth, upper lip, nose area becomes numb   Morphine And Related Other (See Comments)    "Did not feel good"   Other     Cats   Peach Flavor     Mouth and body itches.    Penicillins Other (See Comments)    Unknown Has patient had a PCN reaction causing immediate rash, facial/tongue/throat swelling, SOB or lightheadedness with hypotension: Unknown Has patient had a PCN reaction causing severe rash involving mucus membranes or skin necrosis: Unknown Has patient had a PCN reaction that required hospitalization: No Has patient had a PCN reaction occurring within the last 10 years: No If all of the above answers are "NO", then may proceed with Cephalosporin use.        Objective:    BP 134/68   Pulse 75   Ht 5\' 8"  (1.727 m)   Wt 162 lb 6.4 oz (73.7 kg)   LMP 08/13/2014 (Approximate)   SpO2 98%   BMI 24.69 kg/m  Wt Readings from Last 3 Encounters:  10/30/22 162 lb 6.4 oz (73.7 kg)  02/05/22 168 lb 12.8 oz (76.6 kg)  11/09/21 169 lb 6.4 oz (76.8 kg)    Physical Exam Vitals and nursing note reviewed.  Constitutional:      Appearance: She is well-developed.  HENT:     Head: Normocephalic and atraumatic.     Right Ear: A middle ear effusion is present.     Left Ear: A middle ear effusion is present.     Nose:     Right Turbinates: Enlarged and swollen (erythematous).     Left Turbinates: Enlarged and swollen (erythematous).     Right Sinus: Maxillary sinus tenderness present.     Left Sinus: Maxillary  sinus tenderness present.  Cardiovascular:     Rate and Rhythm:  Normal rate and regular rhythm.     Heart sounds: Normal heart sounds. No murmur heard.    No friction rub. No gallop.  Pulmonary:     Effort: Pulmonary effort is normal. No tachypnea or respiratory distress.     Breath sounds: Normal breath sounds. No decreased breath sounds, wheezing, rhonchi or rales.  Chest:     Chest wall: No tenderness.  Abdominal:     General: Bowel sounds are normal.     Palpations: Abdomen is soft.  Musculoskeletal:        General: Normal range of motion.     Cervical back: Normal range of motion.  Skin:    General: Skin is warm and dry.  Neurological:     Mental Status: She is alert and oriented to person, place, and time.     Coordination: Coordination normal.  Psychiatric:        Behavior: Behavior normal. Behavior is cooperative.        Thought Content: Thought content normal.        Judgment: Judgment normal.          Patient has been counseled extensively about nutrition and exercise as well as the importance of adherence with medications and regular follow-up. The patient was given clear instructions to go to ER or return to medical center if symptoms don't improve, worsen or new problems develop. The patient verbalized understanding.   Follow-up: No follow-ups on file.   Gildardo Pounds, FNP-BC [facility] [place], [place]   10/30/2022, 10:45 PM

## 2022-10-31 LAB — THYROID PANEL WITH TSH
Free Thyroxine Index: 2.3 (ref 1.2–4.9)
T3 Uptake Ratio: 31 % (ref 24–39)
T4, Total: 7.5 ug/dL (ref 4.5–12.0)
TSH: 1.54 u[IU]/mL (ref 0.450–4.500)

## 2022-10-31 LAB — CMP14+EGFR
ALT: 17 IU/L (ref 0–32)
AST: 13 IU/L (ref 0–40)
Albumin/Globulin Ratio: 2 (ref 1.2–2.2)
Albumin: 4.4 g/dL (ref 3.9–4.9)
Alkaline Phosphatase: 98 IU/L (ref 44–121)
BUN/Creatinine Ratio: 20 (ref 12–28)
BUN: 45 mg/dL — ABNORMAL HIGH (ref 8–27)
Bilirubin Total: 0.3 mg/dL (ref 0.0–1.2)
CO2: 16 mmol/L — ABNORMAL LOW (ref 20–29)
Calcium: 9.3 mg/dL (ref 8.7–10.3)
Chloride: 104 mmol/L (ref 96–106)
Creatinine, Ser: 2.3 mg/dL — ABNORMAL HIGH (ref 0.57–1.00)
Globulin, Total: 2.2 g/dL (ref 1.5–4.5)
Glucose: 85 mg/dL (ref 70–99)
Potassium: 4.4 mmol/L (ref 3.5–5.2)
Sodium: 139 mmol/L (ref 134–144)
Total Protein: 6.6 g/dL (ref 6.0–8.5)
eGFR: 23 mL/min/{1.73_m2} — ABNORMAL LOW (ref >59)

## 2022-10-31 LAB — LIPID PANEL
Chol/HDL Ratio: 3.7 ratio (ref 0.0–4.4)
Cholesterol, Total: 223 mg/dL — ABNORMAL HIGH (ref 100–199)
HDL: 60 mg/dL (ref >39)
LDL Chol Calc (NIH): 131 mg/dL — ABNORMAL HIGH (ref 0–99)
Triglycerides: 181 mg/dL — ABNORMAL HIGH (ref 0–149)
VLDL Cholesterol Cal: 32 mg/dL (ref 5–40)

## 2022-10-31 LAB — MICROALBUMIN / CREATININE URINE RATIO
Creatinine, Urine: 51.4 mg/dL
Microalb/Creat Ratio: 1711 mg/g creat — ABNORMAL HIGH (ref 0–29)
Microalbumin, Urine: 879.5 ug/mL

## 2022-10-31 LAB — HEMOGLOBIN A1C
Est. average glucose Bld gHb Est-mCnc: 131 mg/dL
Hgb A1c MFr Bld: 6.2 % — ABNORMAL HIGH (ref 4.8–5.6)

## 2022-11-12 DIAGNOSIS — Z419 Encounter for procedure for purposes other than remedying health state, unspecified: Secondary | ICD-10-CM | POA: Diagnosis not present

## 2022-11-22 ENCOUNTER — Telehealth: Payer: Self-pay | Admitting: Nurse Practitioner

## 2022-11-22 NOTE — Telephone Encounter (Signed)
Pt requesting an appt. Can you update labs

## 2022-11-22 NOTE — Telephone Encounter (Signed)
She just had labs by her PCP and I can use those.

## 2022-12-03 ENCOUNTER — Encounter: Payer: Self-pay | Admitting: Internal Medicine

## 2022-12-03 ENCOUNTER — Other Ambulatory Visit: Payer: Self-pay

## 2022-12-03 ENCOUNTER — Ambulatory Visit (INDEPENDENT_AMBULATORY_CARE_PROVIDER_SITE_OTHER): Payer: Medicaid Other | Admitting: Internal Medicine

## 2022-12-03 VITALS — BP 152/68 | HR 102 | Temp 98.3°F | Resp 18 | Ht 67.0 in | Wt 160.2 lb

## 2022-12-03 DIAGNOSIS — J3089 Other allergic rhinitis: Secondary | ICD-10-CM

## 2022-12-03 DIAGNOSIS — J343 Hypertrophy of nasal turbinates: Secondary | ICD-10-CM

## 2022-12-03 DIAGNOSIS — J329 Chronic sinusitis, unspecified: Secondary | ICD-10-CM

## 2022-12-03 DIAGNOSIS — F17218 Nicotine dependence, cigarettes, with other nicotine-induced disorders: Secondary | ICD-10-CM

## 2022-12-03 DIAGNOSIS — B9689 Other specified bacterial agents as the cause of diseases classified elsewhere: Secondary | ICD-10-CM | POA: Diagnosis not present

## 2022-12-03 DIAGNOSIS — I10 Essential (primary) hypertension: Secondary | ICD-10-CM

## 2022-12-03 MED ORDER — AZELASTINE HCL 0.1 % NA SOLN: 1.0000 | Freq: Two times a day (BID) | NASAL | 11 refills | Status: AC | PRN

## 2022-12-03 MED ORDER — FLUTICASONE PROPIONATE 50 MCG/ACT NA SUSP: 2.0000 | Freq: Every day | NASAL | 11 refills | Status: DC

## 2022-12-03 MED ORDER — LORATADINE 10 MG PO TABS: 10.0000 mg | ORAL_TABLET | Freq: Every day | ORAL | 11 refills | Status: DC | PRN

## 2022-12-03 NOTE — Progress Notes (Signed)
NEW PATIENT  Date of Service/Encounter:  12/03/22  Consult requested by: Claiborne Rigg, NP   Subjective:   Sydney Morrow (DOB: [birth year]) is a 62 y.o. female who presents to the clinic on 12/03/2022 with a chief complaint of Allergic Rhinitis  (Is taking Claritin and flonase but is not helping. ) .    History obtained from: chart review and patient.   Rhinitis:  Started around age 74s.  Symptoms include:  scratch throat, nasal congestion, rhinorrhea, post nasal drainage, watery eyes, and itchy eyes  Occurs year-round with seasonal flares Spring Potential triggers: not sure Treatments tried:  Flonase Claritin PRN; last use was Friday   Previous allergy testing: no History of reflux/heartburn: yes History of sinus surgery: no Nonallergic triggers: none   Of note, she is on beta blocker and has CKD.   Tobacco use: Not sure when she started but has been smoking for over 10 years.  Not completely ready to quit but she said she is thinking about it.  Smokes anywhere from 0.5-1.5ppd and mostly due to stress from taking care of her family.   HTN: Reports having high BP today because she took her BP medications just a little while ago.   Past Medical History: Past Medical History:  Diagnosis Date   Acid reflux    Diabetes    History of anemia    History of hiatal hernia    Hypercholesterolemia    Hypertension    Jaw inflammation, right 01/30/2017    Past Surgical History: Past Surgical History:  Procedure Laterality Date   COLONOSCOPY N/A 03/05/2018   Procedure: COLONOSCOPY;  Surgeon: West Bali, MD;  [facility];  Service: Endoscopy;  Laterality: N/A;  12:00pm   COLONOSCOPY N/A 03/06/2018   Procedure: COLONOSCOPY;  Surgeon: West Bali, MD;  [facility];  Service: Endoscopy;  Laterality: N/A;   DENTAL SURGERY     ESOPHAGOGASTRODUODENOSCOPY  2011   white, raised, somewhat fixed plaques esophageal mucosa concerning for possible  Candida esophagitis s/p brushing. Multiple gastric antral ulcerations, multiple small bulbar erosions and ulcerations.    ESOPHAGOGASTRODUODENOSCOPY N/A 03/05/2018   Procedure: ESOPHAGOGASTRODUODENOSCOPY (EGD);  Surgeon: West Bali, MD;  [facility];  Service: Endoscopy;  Laterality: N/A;   ESOPHAGOGASTRODUODENOSCOPY N/A 03/06/2018   Procedure: ESOPHAGOGASTRODUODENOSCOPY (EGD);  Surgeon: West Bali, MD;  [facility];  Service: Endoscopy;  Laterality: N/A;   TOOTH EXTRACTION N/A 02/01/2017   Procedure: FULL DENTAL EXTRACTIONS;  Surgeon: Ocie Doyne, DDS;  Location: [facility];  Service: Oral Surgery;  Laterality: N/A;   TUBAL LIGATION      Family History: Family History  Problem Relation Age of Onset   Breast cancer Mother    Bronchitis Daughter    Asthma Grandson    Asthma Grandson    Asthma Grandson    Asthma Granddaughter    Colon cancer Neg Hx    Colon polyps Neg Hx     Social History:  Lives in a unknown year apartment Flooring in bedroom: carpet Pets: dog Tobacco use/exposure: current smoker anywhere form 0.5-1.5ppd Job: Engineer, petroleum   Medication List:  Allergies as of 12/03/2022       Reactions   Aspirin Other (See Comments)   Mouth, upper lip, nose area becomes numb   Morphine And Related Other (See Comments)   "Did not feel good"   Other    Cats   Peach Flavor    Mouth and body itches.  Penicillins Other (See Comments)   Unknown Has patient had a PCN reaction causing immediate rash, facial/tongue/throat swelling, SOB or lightheadedness with hypotension: Unknown Has patient had a PCN reaction causing severe rash involving mucus membranes or skin necrosis: Unknown Has patient had a PCN reaction that required hospitalization: No Has patient had a PCN reaction occurring within the last 10 years: No If all of the above answers are "NO", then may proceed with Cephalosporin use.        Medication List        Accurate as of  December 03, 2022  3:14 PM. If you have any questions, ask your nurse or doctor.          amLODipine 10 MG tablet Commonly known as: NORVASC Take 1 tablet (10 mg total) by mouth daily.   atorvastatin 40 MG tablet Commonly known as: LIPITOR Take 1 tablet (40 mg total) by mouth daily.   azelastine 0.1 % nasal spray Commonly known as: ASTELIN Place 1 spray into both nostrils 2 (two) times daily as needed for rhinitis. Use in each nostril as directed Started by: Birder Robson, MD   CO Q 10 PO Take by mouth daily.   FISH OIL PO Take by mouth 2 (two) times a day.   fluticasone 50 MCG/ACT nasal spray Commonly known as: FLONASE Place 2 sprays into both nostrils daily. What changed: how much to take Changed by: Birder Robson, MD   glipiZIDE 5 MG 24 hr tablet Commonly known as: GLUCOTROL XL Take 1 tablet (5 mg total) by mouth daily with breakfast.   hydrALAZINE 50 MG tablet Commonly known as: APRESOLINE Take 1 tablet (50 mg total) by mouth 2 (two) times daily.   levothyroxine 50 MCG tablet Commonly known as: Euthyrox TAKE 1 TABLET BY MOUTH ONCE DAILY BEFORE BREAKFAST   loratadine 10 MG tablet Commonly known as: CLARITIN Take 1 tablet (10 mg total) by mouth daily as needed for rhinitis. What changed:  when to take this reasons to take this additional instructions Changed by: Birder Robson, MD   losartan-hydrochlorothiazide 100-25 MG tablet Commonly known as: HYZAAR Take 1 tablet by mouth daily.   metoprolol tartrate 50 MG tablet Commonly known as: LOPRESSOR Take 1 tablet (50 mg total) by mouth 2 (two) times daily.   pantoprazole 40 MG tablet Commonly known as: PROTONIX Take 1 tablet (40 mg total) by mouth daily.   True Metrix Blood Glucose Test test strip Generic drug: glucose blood Use as instructed   True Metrix Meter w/Device Kit Use as directed to check blood sugar at home.   TRUEplus Lancets 28G Misc Use as directed.   Vitamin D 125 MCG (5000 UT)  Caps Take 5,000 Int'l Units/kg by mouth daily.         REVIEW OF SYSTEMS: Pertinent positives and negatives discussed in HPI.   Objective:   Physical Exam: BP (!) 152/68   Pulse (!) 102   Temp 98.3 F (36.8 C)   Resp 18   Ht 5\' 7"  (1.702 m)   Wt 160 lb 4 oz (72.7 kg)   LMP 08/13/2014 (Approximate)   SpO2 96%   BMI 25.10 kg/m  Body mass index is 25.1 kg/m. GEN: alert, well developed HEENT: clear conjunctiva, TM grey and translucent, nose with + inferior turbinate hypertrophy, pink nasal mucosa, slight clear rhinorrhea, no cobblestoning HEART: regular rate and rhythm, no murmur LUNGS: clear to auscultation bilaterally, no coughing, unlabored respiration ABDOMEN: soft, non distended  SKIN: no  rashes or lesions  Reviewed:  10/20/2022: seen by Meredeth Ide NP for congestion, cough, PND, sinus pressure for few weeks. Also has intermittent symptoms.  Thought to be acute sinusitis and also allergic rhinitis. Started on doxycycline and Flonase/Claritin.  Referred to Allergy. Also discussed tobacco cessation.   06/16/2020: seen by Nephrology for AKI on CKD.  Also with some proteinuria and has HTN/DM. Most recent sCr 2.3 with eGFR of 23.    Skin Testing:  Skin prick testing was placed, which includes aeroallergens/foods, histamine control, and saline control.  Verbal consent was obtained prior to placing test.  Patient tolerated procedure well.  Allergy testing results were read and interpreted by myself, documented by clinical staff. Adequate positive and negative control.  Results discussed with patient/family.  Airborne Adult Perc - 12/03/22 1454     Time Antigen Placed 1420    Allergen Manufacturer Waynette Buttery    Location Back    Number of Test 59    1. Control-Buffer 50% Glycerol Negative    2. Control-Histamine 1 mg/ml 3+    3. Albumin saline Negative    4. Bahia Negative    5. French Southern Territories Negative    6. Johnson Negative    7. Kentucky Blue Negative    8. Meadow Fescue Negative     9. Perennial Rye Negative    10. Sweet Vernal Negative    11. Timothy Negative    12. Cocklebur Negative    13. Burweed Marshelder Negative    14. Ragweed, short Negative    15. Ragweed, Giant Negative    16. Plantain,  English Negative    17. Lamb's Quarters Negative    18. Sheep Sorrell Negative    19. Rough Pigweed Negative    20. Marsh Elder, Rough Negative    21. Mugwort, Common Negative    22. Ash mix Negative    23. Birch mix Negative    24. Beech American Negative    25. Box, Elder Negative    26. Cedar, red Negative    27. Cottonwood, Guinea-Bissau Negative    28. Elm mix Negative    29. Hickory Negative    30. Maple mix Negative    31. Oak, Guinea-Bissau mix Negative    32. Pecan Pollen Negative    33. Pine mix Negative    34. Sycamore Eastern Negative    35. Walnut, Black Pollen Negative    36. Alternaria alternata Negative    37. Cladosporium Herbarum Negative    38. Aspergillus mix Negative    39. Penicillium mix Negative    40. Bipolaris sorokiniana (Helminthosporium) Negative    41. Drechslera spicifera (Curvularia) Negative    42. Mucor plumbeus Negative    43. Fusarium moniliforme Negative    44. Aureobasidium pullulans (pullulara) Negative    45. Rhizopus oryzae Negative    46. Botrytis cinera Negative    47. Epicoccum nigrum Negative    48. Phoma betae Negative    49. Candida Albicans Negative    50. Trichophyton mentagrophytes Negative    51. Mite, D Farinae  5,000 AU/ml Negative    52. Mite, D Pteronyssinus  5,000 AU/ml Negative    53. Cat Hair 10,000 BAU/ml Negative    54.  Dog Epithelia Negative    55. Mixed Feathers Negative    56. Horse Epithelia Negative    57. Cockroach, German Negative    58. Mouse Negative    59. Tobacco Leaf Negative  Intradermal - 12/03/22 1453     Time Antigen Placed 1440    Allergen Manufacturer Waynette Buttery    Location Arm    Number of Test 15    Control Negative    French Southern Territories Negative    Johnson Negative    7  Grass Negative    Ragweed mix Negative    Weed mix Negative    Tree mix Negative    Mold 1 Negative    Mold 2 Negative    Mold 3 Negative    Mold 4 Negative    Cat Negative    Dog Negative    Cockroach Negative    Mite mix Negative               Assessment:   1. Nasal turbinate hypertrophy   2. Other allergic rhinitis   3. Cigarette nicotine dependence with other nicotine-induced disorder   4. Uncontrolled hypertension   5. Bacterial sinusitis   6. Allergic rhinitis due to other allergic trigger, unspecified seasonality     Plan/Recommendations:  Chronic Rhinitis: - Due to turbinate hypertrophy, seasonal worsening and unresponsive to OTC meds, performed skin testing to identify aeroallergen triggers.   - Positive skin test 11/2022: none - Avoidance measures discussed. - Use nasal saline rinses before nose sprays such as with Neilmed Sinus Rinse.  Use distilled water.   - Use Flonase 2 sprays each nostril daily. Aim upward and outward. - Use Azelastine 1-2 sprays each nostril twice daily as needed. Aim upward and outward. - Use Claritin 10 mg daily.  - Consider ENT evaluation if symptoms persist.  Did also discuss that tobacco smoking can irritate the sinuses and is contributing to her symptoms.   Essential Hypertension - Blood pressure was high in our clinic. Please make sure you are taking your BP medications.  Also recommend checking it at home in the morning; if elevated, please follow up with PCP.   Tobacco Use Disorder Patient provided counseling on tobacco cessation today. Discussed the various impacts smoking has on health such as increased infections, lung disease, malignancy, heart disease, stroke etc. Patient is alert and competent.   Willingness to quit: will think about it Methods/skills for cessation: wishes to do it on her own Medication management for smoking: discussed stress management Quit date: not set yet. Will discuss further at next follow up  visit. Spent over 3 minutes for tobacco cessation counseling.      Return if symptoms worsen or fail to improve.  Alesia Morin, MD [facility]

## 2022-12-03 NOTE — Patient Instructions (Addendum)
Chronic Rhinitis:  - Positive skin test 11/2022: none - Use nasal saline rinses before nose sprays such as with Neilmed Sinus Rinse.  Use distilled water.   - Use Flonase 2 sprays each nostril daily. Aim upward and outward. - Use Azelastine 1-2 sprays each nostril twice daily as needed. Aim upward and outward. - Use Claritin 10 mg daily.   Tobacco Use - Please work on smoking cessation.  This is the best thing you can do for your life.    Essential Hypertension - Blood pressure was high in our clinic. Please make sure you are taking your BP medications.  Also recommend checking it at home in the morning; if elevated, please follow up with PCP.

## 2022-12-12 DIAGNOSIS — Z419 Encounter for procedure for purposes other than remedying health state, unspecified: Secondary | ICD-10-CM | POA: Diagnosis not present

## 2022-12-17 ENCOUNTER — Other Ambulatory Visit: Payer: Self-pay | Admitting: Nurse Practitioner

## 2022-12-19 LAB — HM DIABETES EYE EXAM

## 2022-12-21 ENCOUNTER — Ambulatory Visit
Admission: RE | Admit: 2022-12-21 | Discharge: 2022-12-21 | Disposition: A | Discharge status: Home / Self Care | Payer: Medicaid Other | Source: Ambulatory Visit | Attending: Nurse Practitioner

## 2022-12-21 DIAGNOSIS — Z1231 Encounter for screening mammogram for malignant neoplasm of breast: Secondary | ICD-10-CM

## 2023-01-03 ENCOUNTER — Encounter: Payer: Self-pay | Admitting: *Deleted

## 2023-01-12 DIAGNOSIS — Z419 Encounter for procedure for purposes other than remedying health state, unspecified: Secondary | ICD-10-CM | POA: Diagnosis not present

## 2023-02-03 ENCOUNTER — Other Ambulatory Visit: Payer: Self-pay | Admitting: Nurse Practitioner

## 2023-02-03 DIAGNOSIS — I10 Essential (primary) hypertension: Secondary | ICD-10-CM

## 2023-02-05 ENCOUNTER — Other Ambulatory Visit: Payer: Self-pay | Admitting: Nurse Practitioner

## 2023-02-05 DIAGNOSIS — E1122 Type 2 diabetes mellitus with diabetic chronic kidney disease: Secondary | ICD-10-CM

## 2023-02-05 NOTE — Telephone Encounter (Signed)
Take half tablet of losartan-hydrochlorothiazide dose and repeat labs in 2 weeks for creatinine

## 2023-02-06 NOTE — Telephone Encounter (Signed)
Patient identified by name and date of birth.  Patient aware of response and voiced understanding. Lab appointment scheduled.

## 2023-02-11 DIAGNOSIS — Z419 Encounter for procedure for purposes other than remedying health state, unspecified: Secondary | ICD-10-CM | POA: Diagnosis not present

## 2023-02-20 ENCOUNTER — Ambulatory Visit: Payer: Commercial Managed Care - HMO | Attending: Nurse Practitioner

## 2023-02-20 DIAGNOSIS — N184 Chronic kidney disease, stage 4 (severe): Secondary | ICD-10-CM | POA: Diagnosis not present

## 2023-02-20 DIAGNOSIS — E1122 Type 2 diabetes mellitus with diabetic chronic kidney disease: Secondary | ICD-10-CM | POA: Diagnosis not present

## 2023-02-21 DIAGNOSIS — R809 Proteinuria, unspecified: Secondary | ICD-10-CM | POA: Diagnosis not present

## 2023-02-21 DIAGNOSIS — E1122 Type 2 diabetes mellitus with diabetic chronic kidney disease: Secondary | ICD-10-CM | POA: Diagnosis not present

## 2023-02-21 DIAGNOSIS — N189 Chronic kidney disease, unspecified: Secondary | ICD-10-CM | POA: Diagnosis not present

## 2023-02-21 DIAGNOSIS — E1129 Type 2 diabetes mellitus with other diabetic kidney complication: Secondary | ICD-10-CM | POA: Diagnosis not present

## 2023-02-21 DIAGNOSIS — N184 Chronic kidney disease, stage 4 (severe): Secondary | ICD-10-CM | POA: Diagnosis not present

## 2023-02-21 DIAGNOSIS — I10 Essential (primary) hypertension: Secondary | ICD-10-CM | POA: Diagnosis not present

## 2023-02-21 DIAGNOSIS — E8722 Chronic metabolic acidosis: Secondary | ICD-10-CM | POA: Diagnosis not present

## 2023-02-21 LAB — BASIC METABOLIC PANEL
BUN/Creatinine Ratio: 16 (ref 12–28)
BUN: 34 mg/dL — ABNORMAL HIGH (ref 8–27)
CO2: 18 mmol/L — ABNORMAL LOW (ref 20–29)
Calcium: 9.3 mg/dL (ref 8.7–10.3)
Chloride: 106 mmol/L (ref 96–106)
Creatinine, Ser: 2.11 mg/dL — ABNORMAL HIGH (ref 0.57–1.00)
Glucose: 121 mg/dL — ABNORMAL HIGH (ref 70–99)
Potassium: 4.5 mmol/L (ref 3.5–5.2)
Sodium: 140 mmol/L (ref 134–144)
eGFR: 26 mL/min/{1.73_m2} — ABNORMAL LOW (ref >59)

## 2023-03-13 ENCOUNTER — Other Ambulatory Visit: Payer: Self-pay | Admitting: "Endocrinology

## 2023-03-14 DIAGNOSIS — Z419 Encounter for procedure for purposes other than remedying health state, unspecified: Secondary | ICD-10-CM | POA: Diagnosis not present

## 2023-03-18 ENCOUNTER — Encounter: Payer: Self-pay | Admitting: Nurse Practitioner

## 2023-03-18 ENCOUNTER — Ambulatory Visit (INDEPENDENT_AMBULATORY_CARE_PROVIDER_SITE_OTHER): Payer: Medicaid Other | Admitting: Nurse Practitioner

## 2023-03-18 VITALS — BP 150/72 | HR 82 | Ht 67.0 in | Wt 162.2 lb

## 2023-03-18 DIAGNOSIS — E782 Mixed hyperlipidemia: Secondary | ICD-10-CM | POA: Diagnosis not present

## 2023-03-18 DIAGNOSIS — E039 Hypothyroidism, unspecified: Secondary | ICD-10-CM | POA: Diagnosis not present

## 2023-03-18 DIAGNOSIS — E1122 Type 2 diabetes mellitus with diabetic chronic kidney disease: Secondary | ICD-10-CM | POA: Diagnosis not present

## 2023-03-18 DIAGNOSIS — Z7984 Long term (current) use of oral hypoglycemic drugs: Secondary | ICD-10-CM | POA: Diagnosis not present

## 2023-03-18 DIAGNOSIS — N184 Chronic kidney disease, stage 4 (severe): Secondary | ICD-10-CM

## 2023-03-18 DIAGNOSIS — I10 Essential (primary) hypertension: Secondary | ICD-10-CM | POA: Diagnosis not present

## 2023-03-18 LAB — POCT GLYCOSYLATED HEMOGLOBIN (HGB A1C): Hemoglobin A1C: 5.9 % — AB (ref 4.0–5.6)

## 2023-03-18 MED ORDER — ACCU-CHEK GUIDE ME W/DEVICE KIT: PACK | 0 refills | Status: AC

## 2023-03-18 MED ORDER — LEVOTHYROXINE SODIUM 50 MCG PO TABS: ORAL_TABLET | ORAL | 1 refills | Status: DC

## 2023-03-18 NOTE — Progress Notes (Signed)
03/18/2023, 11:06 AM  Endocrinology follow-up note         Subjective:    Patient ID: Sydney Morrow, female    DOB: Feb 03, 1961.  Sydney Morrow is engaged in follow-up for the management of  her currently controlled type 2 diabetes, hypothyroidism, hyperlipidemia.    PCP: Claiborne Rigg, NP   Past Medical History:  Diagnosis Date   Acid reflux    Diabetes (HCC)    History of anemia    History of hiatal hernia    Hypercholesterolemia    Hypertension    Jaw inflammation, right 01/30/2017   Past Surgical History:  Procedure Laterality Date   COLONOSCOPY N/A 03/05/2018   Procedure: COLONOSCOPY;  Surgeon: West Bali, MD;  [facility]: [facility];  Service: Endoscopy;  Laterality: N/A;  12:00pm   COLONOSCOPY N/A 03/06/2018   Procedure: COLONOSCOPY;  Surgeon: West Bali, MD;  [facility]: [facility];  Service: Endoscopy;  Laterality: N/A;   DENTAL SURGERY     ESOPHAGOGASTRODUODENOSCOPY  2011   white, raised, somewhat fixed plaques esophageal mucosa concerning for possible Candida esophagitis s/p brushing. Multiple gastric antral ulcerations, multiple small bulbar erosions and ulcerations.    ESOPHAGOGASTRODUODENOSCOPY N/A 03/05/2018   Procedure: ESOPHAGOGASTRODUODENOSCOPY (EGD);  Surgeon: West Bali, MD;  [facility]: [facility];  Service: Endoscopy;  Laterality: N/A;   ESOPHAGOGASTRODUODENOSCOPY N/A 03/06/2018   Procedure: ESOPHAGOGASTRODUODENOSCOPY (EGD);  Surgeon: West Bali, MD;  [facility]: [facility];  Service: Endoscopy;  Laterality: N/A;   TOOTH EXTRACTION N/A 02/01/2017   Procedure: FULL DENTAL EXTRACTIONS;  Surgeon: Ocie Doyne, DDS;  Location: [facility] OR;  Service: Oral Surgery;  Laterality: N/A;   TUBAL LIGATION     Social History   Socioeconomic History   Marital status: Single    Spouse name: Not on file   Number of children: Not on file   Years of education: Not on  file   Highest education level: Not on file  Occupational History   Not on file  Tobacco Use   Smoking status: Every Day    Current packs/day: 0.50    Average packs/day: 0.5 packs/day for 15.0 years (7.5 ttl pk-yrs)    Types: Cigarettes   Smokeless tobacco: Never  Vaping Use   Vaping status: Never Used  Substance and Sexual Activity   Alcohol use: No   Drug use: No   Sexual activity: Not Currently  Other Topics Concern   Not on file  Social History Narrative   Not on file   Social Determinants of Health   Financial Resource Strain: Not on file  Food Insecurity: Not on file  Transportation Needs: Not on file  Physical Activity: Not on file  Stress: Not on file  Social Connections: Not on file   Outpatient Encounter Medications as of 03/18/2023  Medication Sig   amLODipine (NORVASC) 10 MG tablet Take 1 tablet (10 mg total) by mouth daily.   atorvastatin (LIPITOR) 40 MG tablet Take 1 tablet (40 mg total) by mouth daily.   azelastine (ASTELIN) 0.1 % nasal spray Place 1 spray into both nostrils 2 (two) times daily as needed  for rhinitis. Use in each nostril as directed   Blood Glucose Monitoring Suppl (ACCU-CHEK GUIDE ME) w/Device KIT Use to check glucose twice daily   Cholecalciferol (VITAMIN D) 125 MCG (5000 UT) CAPS Take 5,000 Int'l Units/kg by mouth daily.   Coenzyme Q10 (CO Q 10 PO) Take by mouth daily.   dapagliflozin propanediol (FARXIGA) 10 MG TABS tablet Take 10 mg by mouth daily.   fluticasone (FLONASE) 50 MCG/ACT nasal spray Place 2 sprays into both nostrils daily.   glucose blood (TRUE METRIX BLOOD GLUCOSE TEST) test strip Use as instructed   hydrALAZINE (APRESOLINE) 50 MG tablet Take 1 tablet (50 mg total) by mouth 2 (two) times daily.   loratadine (CLARITIN) 10 MG tablet Take 1 tablet (10 mg total) by mouth daily as needed for rhinitis.   losartan-hydrochlorothiazide (HYZAAR) 100-25 MG tablet Take 1 tablet by mouth once daily   metoprolol tartrate (LOPRESSOR) 50 MG  tablet Take 1 tablet (50 mg total) by mouth 2 (two) times daily.   Omega-3 Fatty Acids (FISH OIL PO) Take by mouth 2 (two) times a day.   pantoprazole (PROTONIX) 40 MG tablet Take 1 tablet (40 mg total) by mouth daily.   sodium bicarbonate 650 MG tablet Take 650 mg by mouth 2 (two) times daily.   TRUEplus Lancets 28G MISC Use as directed.   [DISCONTINUED] glipiZIDE (GLUCOTROL XL) 5 MG 24 hr tablet Take 1 tablet (5 mg total) by mouth daily with breakfast.   [DISCONTINUED] levothyroxine (SYNTHROID) 50 MCG tablet TAKE 1 TABLET BY MOUTH ONCE DAILY BEFORE BREAKFAST   levothyroxine (SYNTHROID) 50 MCG tablet TAKE 1 TABLET BY MOUTH ONCE DAILY BEFORE BREAKFAST   [DISCONTINUED] Blood Glucose Monitoring Suppl (TRUE METRIX METER) w/Device KIT Use as directed to check blood sugar at home. (Patient not taking: Reported on 12/03/2022)   No facility-administered encounter medications on file as of 03/18/2023.    ALLERGIES: Allergies  Allergen Reactions   Aspirin Other (See Comments)    Mouth, upper lip, nose area becomes numb   Morphine And Codeine Other (See Comments)    "Did not feel good"   Other     Cats   Peach Flavor     Mouth and body itches.    Penicillins Other (See Comments)    Unknown Has patient had a PCN reaction causing immediate rash, facial/tongue/throat swelling, SOB or lightheadedness with hypotension: Unknown Has patient had a PCN reaction causing severe rash involving mucus membranes or skin necrosis: Unknown Has patient had a PCN reaction that required hospitalization: No Has patient had a PCN reaction occurring within the last 10 years: No If all of the above answers are "NO", then may proceed with Cephalosporin use.     VACCINATION STATUS: Immunization History  Administered Date(s) Administered   Influenza Whole 05/15/2019   Influenza,inj,Quad PF,6+ Mos 09/10/2018   Pneumococcal Polysaccharide-23 09/10/2018   Tdap 09/10/2018   Zoster Recombinant(Shingrix) 02/05/2022     Diabetes She presents for her follow-up diabetic visit. She has type 2 diabetes mellitus. Onset time: She reports that she was diagnosed at approximate age of 20 years. Her disease course has been improving. Hypoglycemia symptoms include sweats and tremors. Pertinent negatives for hypoglycemia include no confusion, headaches, pallor or seizures. Associated symptoms include polydipsia and polyuria. Pertinent negatives for diabetes include no blurred vision, no chest pain, no fatigue and no polyphagia. There are no hypoglycemic complications. Symptoms are stable. Diabetic complications include nephropathy. Risk factors for coronary artery disease include diabetes mellitus, dyslipidemia, family history,  hypertension, sedentary lifestyle, tobacco exposure and post-menopausal. Current diabetic treatment includes oral agent (dual therapy) (Glipizide and [place]). She is compliant with treatment most of the time. Her weight is fluctuating minimally. She is following a generally healthy diet. When asked about meal planning, she reported none. She has not had a previous visit with a dietitian. She rarely participates in exercise. Her overall blood glucose range is 90-110 mg/dl. (She presents today with no meter or logs to review, has not been monitoring glucose with her own meter as it is in a storage unit from recent move.  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  Her POCT A1c today is 5.9%, essentially unchanged from previous visit.  She does have some episodes of hypoglycemia, associated with meal timing.  Her nephrologist did start her on Farxiga since her last visit with me.) An ACE inhibitor/angiotensin II receptor blocker is being taken. She does not see a podiatrist.Eye exam is current.  Hyperlipidemia This is a chronic problem. The current episode started more than 1 year ago. The problem is uncontrolled. Recent lipid tests were reviewed and are high.  Exacerbating diseases include chronic renal disease, diabetes and hypothyroidism. Factors aggravating her hyperlipidemia include beta blockers, fatty foods, thiazides and smoking. Pertinent negatives include no chest pain, myalgias or shortness of breath. Current antihyperlipidemic treatment includes statins (cannot remember if she has been taking it lately). The current treatment provides mild improvement of lipids. Compliance problems include adherence to exercise and adherence to diet.  Risk factors for coronary artery disease include family history, dyslipidemia, diabetes mellitus, hypertension, a sedentary lifestyle and post-menopausal.  Hypertension This is a chronic problem. The current episode started more than 1 year ago. The problem is unchanged. The problem is uncontrolled. Associated symptoms include sweats. Pertinent negatives include no blurred vision, chest pain, headaches, palpitations or shortness of breath. Agents associated with hypertension include thyroid hormones. Risk factors for coronary artery disease include diabetes mellitus, dyslipidemia and sedentary lifestyle. Past treatments include angiotensin blockers, central alpha agonists and diuretics. The current treatment provides no improvement. Compliance problems include exercise and diet.  Hypertensive end-organ damage includes kidney disease. Identifiable causes of hypertension include chronic renal disease.     Review of systems  Constitutional: + Minimally fluctuating body weight,  current Body mass index is 25.4 kg/m. , + fatigue, no subjective hyperthermia, no subjective hypothermia Eyes: no blurry vision, no xerophthalmia ENT: no sore throat, no nodules palpated in throat, no dysphagia/odynophagia, no hoarseness Cardiovascular: no chest pain, no shortness of breath, no palpitations, no leg swelling Respiratory: no cough, no shortness of breath Gastrointestinal: no nausea/vomiting/diarrhea Musculoskeletal: no  muscle/joint aches Skin: no rashes, no hyperemia Neurological: no tremors, no numbness, no tingling, no dizziness Psychiatric: no depression, no anxiety   Objective:    BP (!) 150/72 (BP Location: Right Arm, Patient Position: Sitting, Cuff Size: Large) Comment: Retake Manuel cuff  Pulse 82   Ht 5\' 7"  (1.702 m)   Wt 162 lb 3.2 oz (73.6 kg)   LMP 08/13/2014 (Approximate)   BMI 25.40 kg/m   Wt Readings from Last 3 Encounters:  03/18/23 162 lb 3.2 oz (73.6 kg)  12/03/22 160 lb 4 oz (72.7 kg)  10/30/22 162 lb 6.4 oz (73.7 kg)     BP Readings from Last 3 Encounters:  03/18/23 (!) 150/72  12/03/22 (!) 152/68  10/30/22 134/68     Physical Exam- Limited  Constitutional:  Body mass index  is 25.4 kg/m. , not in acute distress, normal state of mind Eyes:  EOMI, no exophthalmos Musculoskeletal: no gross deformities, strength intact in all four extremities, no gross restriction of joint movements Skin:  no rashes, no hyperemia Neurological: no tremor with outstretched hands   Diabetic Foot Exam - Simple   Simple Foot Form Diabetic Foot exam was performed with the following findings: Yes 03/18/2023 10:52 AM  Visual Inspection No deformities, no ulcerations, no other skin breakdown bilaterally: Yes Sensation Testing Intact to touch and monofilament testing bilaterally: Yes Pulse Check Posterior Tibialis and Dorsalis pulse intact bilaterally: Yes Comments     CMP ( most recent) CMP     Component Value Date/Time   NA 140 02/20/2023 1025   K 4.5 02/20/2023 1025   CL 106 02/20/2023 1025   CO2 18 (L) 02/20/2023 1025   GLUCOSE 121 (H) 02/20/2023 1025   GLUCOSE 162 (H) 08/01/2021 1211   BUN 34 (H) 02/20/2023 1025   CREATININE 2.11 (H) 02/20/2023 1025   CREATININE 2.00 (H) 06/01/2020 1620   CALCIUM 9.3 02/20/2023 1025   PROT 6.6 10/30/2022 1212   ALBUMIN 4.4 10/30/2022 1212   AST 13 10/30/2022 1212   ALT 17 10/30/2022 1212   ALKPHOS 98 10/30/2022 1212   BILITOT 0.3  10/30/2022 1212   GFRNONAA 31 (L) 08/01/2021 1211   GFRNONAA 26 (L) 06/01/2020 1620   GFRAA 35 (L) 09/27/2020 1215   GFRAA 31 (L) 06/01/2020 1620     Diabetic Labs (most recent): Lab Results  Component Value Date   HGBA1C 5.9 (A) 03/18/2023   HGBA1C 6.2 (H) 10/30/2022   HGBA1C 6.1 (H) 02/05/2022   MICROALBUR 138.5 06/01/2020   MICROALBUR 1,036.3 (H) 07/04/2018     Lipid Panel ( most recent) Lipid Panel     Component Value Date/Time   CHOL 223 (H) 10/30/2022 1212   TRIG 181 (H) 10/30/2022 1212   HDL 60 10/30/2022 1212   CHOLHDL 3.7 10/30/2022 1212   CHOLHDL 5.9 08/01/2021 1211   VLDL 50 (H) 08/01/2021 1211   LDLCALC 131 (H) 10/30/2022 1212       Assessment & Plan:   1) Type 2 diabetes mellitus with stage 3 chronic kidney disease, without long-term current use of insulin (HCC)  - Sydney Morrow has currently uncontrolled symptomatic type 2 DM since 62 years of age.  She presents today with no meter or logs to review, has not been monitoring glucose with her own meter as it is in a storage unit from recent move.  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  Her POCT A1c today is 5.9%, essentially unchanged from previous visit.  She does have some episodes of hypoglycemia, associated with meal timing.  Her nephrologist did start her on Farxiga since her last visit with me.  -her diabetes is complicated by stage 4 renal insufficiency, sedentary life, heavy smoking and she remains at extremely high risk for more acute and chronic complications which include CAD, CVA, CKD, retinopathy, and neuropathy. These are all discussed in detail with her.  - Nutritional counseling repeated at each appointment due to patients tendency to fall back in to old habits.  - The patient admits there is a room for improvement in their diet and drink choices. -  Suggestion is made for the patient to avoid simple carbohydrates from their diet including  Cakes, Sweet Desserts / Pastries, Ice Cream, Soda (diet and regular), Sweet Tea,  Candies, Chips, Cookies, Sweet Pastries, Store Bought Juices, Alcohol in Excess of 1-2 drinks a day, Artificial Sweeteners, Coffee Creamer, and "Sugar-free" Products. This will help patient to have stable blood glucose profile and potentially avoid unintended weight gain.   - I encouraged the patient to switch to unprocessed or minimally processed complex starch and increased protein intake (animal or plant source), fruits, and vegetables.   - Patient is advised to stick to a routine mealtimes to eat 3 meals a day and avoid unnecessary snacks (to snack only to correct hypoglycemia).  -Given the lack of meter or logs to review today, no changes will be made to her medication regimen.  She is advised to continue Glipizide 5 mg XL daily with breakfast.   -She is advised to start monitoring blood glucose at least once daily, before breakfast (and if she experiences symptoms of hypoglycemia), and call the clinic if she has readings less than 70 or greater than 300 for 3 tests in a row.   2) Lipids/HPL: Her most recent lipid panel from 10/30/22 shows uncontrolled LDL of 131 (improving) and elevated triglycerides of 181- improving.  She is advised to restart Lipitor 40 mg po daily at bedtime and Fish oil supplement- refill sent in today.  Side effects and precautions discussed with her.  3) HTN Her blood pressure is not controlled to target but it is improving- she just left work to come to her appointment.  She is advised to continue Amlodipine 10 mg po daily, Hydralazine 50 mg po twice daily, Losartan-HCT 100-25 mg po daily, and Metoprolol 50 mg po twice daily.  She has a history of not taking her medications properly.  She also follows with nephrology (will defer to PCP or nephrology for management).  4) Hypothyroidism: There are no recent TFTs to review.  Will recheck TFTs prior to next visit.  She is advised to continue  Levothyroxine 50 mcg po daily before breakfast.   - We discussed about the correct intake of her thyroid hormone, on empty stomach at fasting, with water, separated by at least 30 minutes from breakfast and other medications,  and separated by more than 4 hours from calcium, iron, multivitamins, acid reflux medications (PPIs). -Patient is made aware of the fact that thyroid hormone replacement is needed for life, dose to be adjusted by periodic monitoring of thyroid function tests.  5) Chronic heavy smoking: The patient was counseled on the dangers of tobacco use, and was advised to quit.  Reviewed strategies to maximize success, including removing cigarettes and smoking materials from environment.  6) Weight management:  Her Body mass index is 25.4 kg/m.  She is not a candidate for major weight loss.  Exercise and carbs regimen discussed in detail with her.   - I advised patient to maintain close follow up with Claiborne Rigg, NP for primary care needs.     I spent  36  minutes in the care of the patient today including review of labs from CMP, Lipids, Thyroid Function, Hematology (current and previous including abstractions from other facilities); face-to-face time discussing  her blood glucose readings/logs, discussing hypoglycemia and hyperglycemia episodes and symptoms, medications doses, her options of short and long term treatment based on the latest standards of care / guidelines;  discussion about incorporating lifestyle medicine;  and documenting the encounter. Risk reduction counseling performed per USPSTF guidelines to reduce obesity and cardiovascular risk factors.     Please refer to Patient Instructions for Blood Glucose Monitoring and  Insulin/Medications Dosing Guide"  in media tab for additional information. Please  also refer to " Patient Self Inventory" in the Media  tab for reviewed elements of pertinent patient history.  Deliah Boston participated in the discussions,  expressed understanding, and voiced agreement with the above plans.  All questions were answered to her satisfaction. she is encouraged to contact clinic should she have any questions or concerns prior to her return visit.   Follow up plan: - Return in about 3 months (around 06/18/2023) for Diabetes F/U with A1c in office, Thyroid follow up, Previsit labs.  Ronny Bacon, [facility] [address] [place], [place] [address] Phone: [phone] Fax: [phone]  03/18/2023, 11:06 AM

## 2023-03-19 ENCOUNTER — Ambulatory Visit: Payer: Self-pay

## 2023-03-19 ENCOUNTER — Other Ambulatory Visit: Payer: Self-pay | Admitting: *Deleted

## 2023-03-19 DIAGNOSIS — I10 Essential (primary) hypertension: Secondary | ICD-10-CM

## 2023-03-19 DIAGNOSIS — N184 Chronic kidney disease, stage 4 (severe): Secondary | ICD-10-CM

## 2023-03-19 DIAGNOSIS — E1165 Type 2 diabetes mellitus with hyperglycemia: Secondary | ICD-10-CM

## 2023-03-19 DIAGNOSIS — E1122 Type 2 diabetes mellitus with diabetic chronic kidney disease: Secondary | ICD-10-CM

## 2023-03-19 MED ORDER — ACCU-CHEK GUIDE VI STRP: ORAL_STRIP | 3 refills | Status: AC

## 2023-03-19 MED ORDER — ACCU-CHEK SOFTCLIX LANCETS MISC: 3 refills | Status: AC

## 2023-03-19 NOTE — Telephone Encounter (Signed)
Noted  

## 2023-03-19 NOTE — Telephone Encounter (Signed)
  Chief Complaint: Back pain - sciatic pain Symptoms: back pain - sciatic pain right side Frequency: Has had this in the past. Has been coming on for a few months. Much worse since thurs. Pertinent Negatives: Patient denies Neurologic issues Disposition: [] ED /[] Urgent Care (no appt availability in office) / [] Appointment(In office/virtual)/ []  [place] [facility]/ [] Home Care/ [x] Refused Recommended Disposition /[] [facility]/ []  Follow-up with PCP Additional Notes: PT states that she has had sciatic pain in the past and this is a reoccurrence. Pt states that pain has increased a lot since Thursday. No appts in office. Pt should be seen today. Pt states that she will go to the mobile unit for care on Thursday in [place]. She lives there and really does not want to travel to [facility]. Please advise.  Reason for Disposition  [1] SEVERE back pain (e.g., excruciating, unable to do any normal activities) AND [2] not improved 2 hours after pain medicine  Answer Assessment - Initial Assessment Questions 1. ONSET: "When did the pain begin?"      Last couple of weeks - since last Thursday - every day 2. LOCATION: "Where does it hurt?" (upper, mid or lower back)     Right side - shoots down the leg 3. SEVERITY: "How bad is the pain?"  (e.g., Scale 1-10; mild, moderate, or severe)   - MILD (1-3): Doesn't interfere with normal activities.    - MODERATE (4-7): Interferes with normal activities or awakens from sleep.    - SEVERE (8-10): Excruciating pain, unable to do any normal activities.      7/10 4. PATTERN: "Is the pain constant?" (e.g., yes, no; constant, intermittent)      constant 5. RADIATION: "Does the pain shoot into your legs or somewhere else?"     Down leg 6. CAUSE:  "What do you think is causing the back pain?"      Sciatic  7. BACK OVERUSE:  "Any recent lifting of heavy objects, strenuous work or exercise?"     No - rammed her back into washing machine 8. MEDICINES:  "What have you taken so far for the pain?" (e.g., nothing, acetaminophen, NSAIDS)     Tylenol - helps a little 9. NEUROLOGIC SYMPTOMS: "Do you have any weakness, numbness, or problems with bowel/bladder control?"     no 10. OTHER SYMPTOMS: "Do you have any other symptoms?" (e.g., fever, abdomen pain, burning with urination, blood in urine)       no  Protocols used: Back Pain-A-AH

## 2023-04-14 DIAGNOSIS — Z419 Encounter for procedure for purposes other than remedying health state, unspecified: Secondary | ICD-10-CM | POA: Diagnosis not present

## 2023-04-22 ENCOUNTER — Telehealth: Payer: Self-pay

## 2023-04-22 NOTE — Telephone Encounter (Signed)
Copied from CRM [id number]. 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

## 2023-04-23 ENCOUNTER — Encounter: Payer: Self-pay | Admitting: Nurse Practitioner

## 2023-04-23 NOTE — Telephone Encounter (Signed)
Voicemail left with response.

## 2023-04-23 NOTE — Telephone Encounter (Signed)
Can this letter be updated to current date?

## 2023-04-23 NOTE — Telephone Encounter (Signed)
Letter is in mychart.

## 2023-05-14 DIAGNOSIS — Z419 Encounter for procedure for purposes other than remedying health state, unspecified: Secondary | ICD-10-CM | POA: Diagnosis not present

## 2023-05-16 DIAGNOSIS — R809 Proteinuria, unspecified: Secondary | ICD-10-CM | POA: Diagnosis not present

## 2023-05-16 DIAGNOSIS — I10 Essential (primary) hypertension: Secondary | ICD-10-CM | POA: Diagnosis not present

## 2023-05-16 DIAGNOSIS — E1129 Type 2 diabetes mellitus with other diabetic kidney complication: Secondary | ICD-10-CM | POA: Diagnosis not present

## 2023-05-16 DIAGNOSIS — N189 Chronic kidney disease, unspecified: Secondary | ICD-10-CM | POA: Diagnosis not present

## 2023-05-16 DIAGNOSIS — E1122 Type 2 diabetes mellitus with diabetic chronic kidney disease: Secondary | ICD-10-CM | POA: Diagnosis not present

## 2023-05-16 DIAGNOSIS — E8722 Chronic metabolic acidosis: Secondary | ICD-10-CM | POA: Diagnosis not present

## 2023-05-16 DIAGNOSIS — N184 Chronic kidney disease, stage 4 (severe): Secondary | ICD-10-CM | POA: Diagnosis not present

## 2023-05-23 ENCOUNTER — Ambulatory Visit: Payer: Medicaid Other | Admitting: Physician Assistant

## 2023-05-23 ENCOUNTER — Encounter: Payer: Self-pay | Admitting: Physician Assistant

## 2023-05-23 VITALS — BP 176/85 | Ht 66.5 in | Wt 161.0 lb

## 2023-05-23 DIAGNOSIS — E1129 Type 2 diabetes mellitus with other diabetic kidney complication: Secondary | ICD-10-CM | POA: Diagnosis not present

## 2023-05-23 DIAGNOSIS — I10 Essential (primary) hypertension: Secondary | ICD-10-CM | POA: Diagnosis not present

## 2023-05-23 DIAGNOSIS — R809 Proteinuria, unspecified: Secondary | ICD-10-CM | POA: Diagnosis not present

## 2023-05-23 DIAGNOSIS — N184 Chronic kidney disease, stage 4 (severe): Secondary | ICD-10-CM | POA: Diagnosis not present

## 2023-05-23 DIAGNOSIS — E8722 Chronic metabolic acidosis: Secondary | ICD-10-CM | POA: Diagnosis not present

## 2023-05-23 DIAGNOSIS — M5441 Lumbago with sciatica, right side: Secondary | ICD-10-CM

## 2023-05-23 DIAGNOSIS — N189 Chronic kidney disease, unspecified: Secondary | ICD-10-CM | POA: Diagnosis not present

## 2023-05-23 DIAGNOSIS — E1122 Type 2 diabetes mellitus with diabetic chronic kidney disease: Secondary | ICD-10-CM | POA: Diagnosis not present

## 2023-05-23 MED ORDER — METHYLPREDNISOLONE ACETATE 80 MG/ML IJ SUSP
80.0000 mg | Freq: Once | INTRAMUSCULAR | Status: AC
Start: 2023-05-23 — End: 2023-05-23
  Administered 2023-05-23: 80 mg via INTRAMUSCULAR

## 2023-05-23 MED ORDER — METHYLPREDNISOLONE SODIUM SUCC 125 MG IJ SOLR
125.0000 mg | Freq: Once | INTRAMUSCULAR | Status: DC
Start: 2023-05-23 — End: 2023-05-23

## 2023-05-23 MED ORDER — CYCLOBENZAPRINE HCL 5 MG PO TABS
5.0000 mg | ORAL_TABLET | Freq: Three times a day (TID) | ORAL | 0 refills | Status: DC | PRN
Start: 2023-05-23 — End: 2024-02-19

## 2023-05-23 NOTE — Progress Notes (Signed)
Established Patient Office Visit  Subjective   Patient ID: Sydney Morrow, female    DOB: 05/15/1961  Age: 62 y.o. MRN: [id number]  Chief Complaint  Patient presents with   Leg Pain    Right Leg pain, flared up about 2 months ago     States that she has been having right sided low back pain that radiates down the back of her leg to her foot and around the front of her leg as well.  States this has been ongoing for the past 2 months.  Does endorse previous injury to her right ankle approximately 1 year ago, otherwise no recent injury or trauma.  Denies numbness or tingling.  States that she has tried Therapist, sports and Tylenol without relief.    States she does check her blood pressure at home,    Past Medical History:  Diagnosis Date   Acid reflux    Diabetes (HCC)    History of anemia    History of hiatal hernia    Hypercholesterolemia    Hypertension    Jaw inflammation, right 01/30/2017   Social History   Socioeconomic History   Marital status: Single    Spouse name: Not on file   Number of children: Not on file   Years of education: Not on file   Highest education level: Not on file  Occupational History   Not on file  Tobacco Use   Smoking status: Every Day    Current packs/day: 0.50    Average packs/day: 0.5 packs/day for 15.0 years (7.5 ttl pk-yrs)    Types: Cigarettes   Smokeless tobacco: Never  Vaping Use   Vaping status: Never Used  Substance and Sexual Activity   Alcohol use: No   Drug use: No   Sexual activity: Not Currently  Other Topics Concern   Not on file  Social History Narrative   Not on file   Social Determinants of Health   Financial Resource Strain: Not on file  Food Insecurity: Not on file  Transportation Needs: Not on file  Physical Activity: Not on file  Stress: Not on file  Social Connections: Not on file  Intimate Partner Violence: Not on file   Family History  Problem Relation Age of Onset   Breast cancer Mother     Bronchitis Daughter    Asthma Grandson    Asthma Grandson    Asthma Grandson    Asthma Granddaughter    Colon cancer Neg Hx    Colon polyps Neg Hx    Allergies  Allergen Reactions   Aspirin Other (See Comments)    Mouth, upper lip, nose area becomes numb   Morphine And Codeine Other (See Comments)    "Did not feel good"   Other     Cats   Peach Flavor     Mouth and body itches.    Penicillins Other (See Comments)    Unknown Has patient had a PCN reaction causing immediate rash, facial/tongue/throat swelling, SOB or lightheadedness with hypotension: Unknown Has patient had a PCN reaction causing severe rash involving mucus membranes or skin necrosis: Unknown Has patient had a PCN reaction that required hospitalization: No Has patient had a PCN reaction occurring within the last 10 years: No If all of the above answers are "NO", then may proceed with Cephalosporin use.     Review of Systems  Constitutional: Negative.   HENT: Negative.    Eyes: Negative.   Respiratory:  Negative for shortness of breath.  Cardiovascular:  Negative for chest pain.  Gastrointestinal:  Negative for abdominal pain, nausea and vomiting.  Genitourinary:  Negative for dysuria and hematuria.  Musculoskeletal:  Positive for back pain and myalgias.  Skin: Negative.   Neurological: Negative.   Endo/Heme/Allergies: Negative.   Psychiatric/Behavioral: Negative.        Objective:     BP (!) 176/85 (BP Location: Right Arm, Patient Position: Sitting, Cuff Size: Normal)   Ht 5' 6.5" (1.689 m)   Wt 161 lb (73 kg)   LMP 08/13/2014 (Approximate)   SpO2 97%   BMI 25.60 kg/m  BP Readings from Last 3 Encounters:  05/23/23 (!) 176/85  03/18/23 (!) 150/72  12/03/22 (!) 152/68   Wt Readings from Last 3 Encounters:  05/23/23 161 lb (73 kg)  03/18/23 162 lb 3.2 oz (73.6 kg)  12/03/22 160 lb 4 oz (72.7 kg)    Physical Exam Vitals and nursing note reviewed.  Constitutional:      Appearance: Normal  appearance.  HENT:     Head: Normocephalic and atraumatic.     Right Ear: External ear normal.     Left Ear: External ear normal.     Nose: Nose normal.     Mouth/Throat:     Mouth: Mucous membranes are moist.     Pharynx: Oropharynx is clear.  Eyes:     Extraocular Movements: Extraocular movements intact.     Conjunctiva/sclera: Conjunctivae normal.     Pupils: Pupils are equal, round, and reactive to light.  Cardiovascular:     Rate and Rhythm: Normal rate and regular rhythm.     Pulses: Normal pulses.          Dorsalis pedis pulses are 2+ on the right side and 2+ on the left side.       Posterior tibial pulses are 2+ on the right side and 2+ on the left side.     Heart sounds: Normal heart sounds.  Pulmonary:     Breath sounds: Normal breath sounds.  Musculoskeletal:     Cervical back: Normal, normal range of motion and neck supple.     Thoracic back: Normal.     Lumbar back: Tenderness present. No swelling. Decreased range of motion.     Right lower leg: No edema.     Left lower leg: No edema.     Comments: Pain elicited with range of motion testing  Skin:    General: Skin is warm and dry.  Neurological:     General: No focal deficit present.     Mental Status: She is alert and oriented to person, place, and time.  Psychiatric:        Mood and Affect: Mood normal.        Behavior: Behavior normal.        Thought Content: Thought content normal.        Judgment: Judgment normal.        Assessment & Plan:   Problem List Items Addressed This Visit   None Visit Diagnoses     Acute right-sided low back pain with right-sided sciatica    -  Primary   Relevant Medications   cyclobenzaprine (FLEXERIL) 5 MG tablet   methylPREDNISolone acetate (DEPO-MEDROL) injection 80 mg   Elevated blood pressure reading in office with diagnosis of hypertension         1. Acute right-sided low back pain with right-sided sciatica , Flexeril.  Patient education given on supportive  care, red flags given for prompt  reevaluation. - cyclobenzaprine (FLEXERIL) 5 MG tablet; Take 1 tablet (5 mg total) by mouth 3 (three) times daily as needed for muscle spasms.  Dispense: 30 tablet; Refill: 0 - methylPREDNISolone acetate (DEPO-MEDROL) injection 80 mg  2. Elevated blood pressure reading in office with diagnosis of hypertension Patient encouraged to continue checking blood pressure at home, keeping a written log and having available for all office visits.  Patient encouraged to follow-up with primary care provider or return to mobile unit if blood pressure readings remain elevated.   I have reviewed the patient's medical history (PMH, PSH, Social History, Family History, Medications, and allergies) , and have been updated if relevant. I spent 30 minutes reviewing chart and  face to face time with patient.     Return if symptoms worsen or fail to improve.    Kasandra Knudsen Mayers, PA-C

## 2023-05-23 NOTE — Patient Instructions (Signed)
You are going to use Flexeril every 8 hours as needed.  I encourage you to make sure you are staying well-hydrated, and get plenty of rest.  I encourage you to start doing some gentle stretching once your pain has resolved.  Your blood pressure is elevated today, I encourage you to continue checking your blood pressure at home, keeping a written log and have available for all office visits.  If your blood pressure remains elevated please feel free to return to the mobile unit or follow-up with Zelda.  Roney Jaffe, PA-C Physician Assistant [facility] [email or web address]   Sciatica  Sciatica is pain, numbness, weakness, or tingling along the path of the sciatic nerve. The sciatic nerve starts in the lower back and runs down the back of each leg. The nerve controls the muscles in the lower leg and in the back of the knee. It also provides feeling (sensation) to the back of the thigh, the lower leg, and the sole of the foot. Sciatica is a symptom of another medical condition that pinches or puts pressure on the sciatic nerve. Sciatica most often only affects one side of the body. Sciatica usually goes away on its own or with treatment. In some cases, sciatica may come back (recur). What are the causes? This condition is caused by pressure on the sciatic nerve or pinching of the nerve. This may be the result of: A disk in between the bones of the spine bulging out too far (herniated disk). Age-related changes in the spinal disks. A pain disorder that affects a muscle in the buttock. Extra bone growth near the sciatic nerve. A break (fracture) of the pelvis. Pregnancy. Tumor. This is rare. What increases the risk? The following factors may make you more likely to develop this condition: Playing sports that place pressure or stress on the spine. Having poor strength and flexibility. A history of back injury or surgery. Sitting for long  periods of time. Doing activities that involve repetitive bending or lifting. Obesity. What are the signs or symptoms? Symptoms can vary from mild to very severe. They may include: Any of the following problems in the lower back, leg, hip, or buttock: Mild tingling, numbness, or dull aches. Burning sensations. Sharp pains. Numbness in the back of the calf or the sole of the foot. Leg weakness. Severe back pain that makes movement difficult. Symptoms may get worse when you cough, sneeze, or laugh, or when you sit or stand for long periods of time. How is this diagnosed? This condition may be diagnosed based on: Your symptoms and medical history. A physical exam. Blood tests. Imaging tests, such as: X-rays. An MRI. A CT scan. How is this treated? In many cases, this condition improves on its own without treatment. However, treatment may include: Reducing or modifying physical activity. Exercising, including strengthening and stretching. Icing and applying heat to the affected area. Medicines that help to: Relieve pain and swelling. Relax your muscles. Injections of medicines that help to relieve pain and inflammation (steroids) around the sciatic nerve. Surgery. Follow these instructions at home: Medicines Take over-the-counter and prescription medicines only as told by your health care provider. Ask your health care provider if the medicine prescribed to you requires you to avoid driving or using heavy machinery. Managing pain     If directed, put ice on the affected area. To do this: Put ice in a plastic bag. Place a towel between your skin and the bag. Leave the ice on for  20 minutes, 2-3 times a day. If your skin turns bright red, remove the ice right away to prevent skin damage. The risk of skin damage is higher if you cannot feel pain, heat, or cold. If directed, apply heat to the affected area as often as told by your health care provider. Use the heat source that  your health care provider recommends, such as a moist heat pack or a heating pad. Place a towel between your skin and the heat source. Leave the heat on for 20-30 minutes. If your skin turns bright red, remove the heat right away to prevent burns. The risk of burns is higher if you cannot feel pain, heat, or cold. Activity  Return to your normal activities as told by your health care provider. Ask your health care provider what activities are safe for you. Avoid activities that make your symptoms worse. Take brief periods of rest throughout the day. When you rest for longer periods, mix in some mild activity or stretching between periods of rest. This will help to prevent stiffness and pain. Avoid sitting for long periods of time without moving. Get up and move around at least one time each hour. Exercise and stretch regularly as told by your health care provider. Do not lift anything that is heavier than 10 lb (4.5 kg) until your health care provider says that it is safe. When you do not have symptoms, you should still avoid heavy lifting, especially repetitive heavy lifting. When you lift objects, always use proper lifting technique, which includes: Bending your knees. Keeping the load close to your body. Avoiding twisting. General instructions Maintain a healthy weight. Excess weight puts extra stress on your back. Wear supportive, comfortable shoes. Avoid wearing high heels. Avoid sleeping on a mattress that is too soft or too hard. A mattress that is firm enough to support your back when you sleep may help to reduce your pain. Contact a health care provider if: Your pain is not controlled by medicine. Your pain does not improve or gets worse. Your pain lasts longer than 4 weeks. You have unexplained weight loss. Get help right away if: You are not able to control when you urinate or have bowel movements (incontinence). You have: Weakness in your lower back, pelvis, buttocks, or legs  that gets worse. Redness or swelling of your back. A burning sensation when you urinate. Summary Sciatica is pain, numbness, weakness, or tingling along the path of the sciatic nerve, which may include the lower back, legs, hips, and buttocks. This condition is caused by pressure on the sciatic nerve or pinching of the nerve. Treatment often includes rest, exercise, medicines, and applying ice or heat. This information is not intended to replace advice given to you by your health care provider. Make sure you discuss any questions you have with your health care provider. Document Revised: 11/06/2021 Document Reviewed: 11/06/2021 Elsevier Patient Education  2024 [employer].

## 2023-05-29 ENCOUNTER — Ambulatory Visit: Payer: Commercial Managed Care - HMO | Admitting: Nurse Practitioner

## 2023-06-11 ENCOUNTER — Telehealth: Payer: Self-pay | Admitting: *Deleted

## 2023-06-11 ENCOUNTER — Telehealth: Payer: Self-pay

## 2023-06-11 NOTE — Telephone Encounter (Signed)
Tammy, LPN called from [facility] to verify if patient had recent lab work done at her PCP office for a T3 and T4. Advised patient's last T4 was resulted on 03/18/23. Patient has not had one since that date in our records. Tammy stated she would advise patient that she will need to have her lab drawn before her next appointment that is scheduled at the Endocrinology office. They ordered the labs but patient thought she ha the labs done recently.

## 2023-06-11 NOTE — Telephone Encounter (Signed)
Patient called and left a message that she had lab work done about 3 weeks ago and that we could get the labs we needed from the. They were done through [facility]. I called the office that she said that she had them done, , her last T4 and TSH was done on 03/18/23.. She did have lab work about three weeks ago by a Nephrologist. The test that was requested for her text visit here were not done.  Patient was called back and a message left , letting her know this. Ask the patient if she could go to the [facility], if not to please let us know so that we may send the order to [facility].

## 2023-06-13 ENCOUNTER — Telehealth: Payer: Self-pay | Admitting: Nurse Practitioner

## 2023-06-13 DIAGNOSIS — E039 Hypothyroidism, unspecified: Secondary | ICD-10-CM

## 2023-06-13 NOTE — Telephone Encounter (Signed)
Order changed to [facility]

## 2023-06-13 NOTE — Telephone Encounter (Signed)
Can you update labs for [facility] instead of [facility].

## 2023-06-14 DIAGNOSIS — Z419 Encounter for procedure for purposes other than remedying health state, unspecified: Secondary | ICD-10-CM | POA: Diagnosis not present

## 2023-06-14 DIAGNOSIS — E039 Hypothyroidism, unspecified: Secondary | ICD-10-CM | POA: Diagnosis not present

## 2023-06-15 LAB — T4, FREE: Free T4: 1.1 ng/dL (ref 0.8–1.8)

## 2023-06-15 LAB — TSH: TSH: 1 m[IU]/L (ref 0.40–4.50)

## 2023-06-18 ENCOUNTER — Encounter: Payer: Self-pay | Admitting: Nurse Practitioner

## 2023-06-18 ENCOUNTER — Ambulatory Visit (INDEPENDENT_AMBULATORY_CARE_PROVIDER_SITE_OTHER): Payer: Medicaid Other | Admitting: Nurse Practitioner

## 2023-06-18 ENCOUNTER — Ambulatory Visit: Payer: Commercial Managed Care - HMO | Admitting: Nurse Practitioner

## 2023-06-18 VITALS — BP 152/70 | HR 80 | Ht 66.5 in | Wt 159.0 lb

## 2023-06-18 DIAGNOSIS — E039 Hypothyroidism, unspecified: Secondary | ICD-10-CM

## 2023-06-18 DIAGNOSIS — I10 Essential (primary) hypertension: Secondary | ICD-10-CM | POA: Diagnosis not present

## 2023-06-18 DIAGNOSIS — Z7984 Long term (current) use of oral hypoglycemic drugs: Secondary | ICD-10-CM | POA: Diagnosis not present

## 2023-06-18 DIAGNOSIS — N1832 Chronic kidney disease, stage 3b: Secondary | ICD-10-CM | POA: Diagnosis not present

## 2023-06-18 DIAGNOSIS — E782 Mixed hyperlipidemia: Secondary | ICD-10-CM | POA: Diagnosis not present

## 2023-06-18 DIAGNOSIS — E1122 Type 2 diabetes mellitus with diabetic chronic kidney disease: Secondary | ICD-10-CM | POA: Diagnosis not present

## 2023-06-18 LAB — POCT GLYCOSYLATED HEMOGLOBIN (HGB A1C): Hemoglobin A1C: 6.4 % — AB (ref 4.0–5.6)

## 2023-06-18 MED ORDER — LEVOTHYROXINE SODIUM 50 MCG PO TABS: ORAL_TABLET | ORAL | 1 refills | Status: DC

## 2023-06-18 NOTE — Progress Notes (Signed)
06/18/2023, 11:06 AM  Endocrinology follow-up note         Subjective:    Patient ID: Sydney Morrow, female    DOB: 13-Feb-1961.  Sydney Morrow is engaged in follow-up for the management of  her currently controlled type 2 diabetes, hypothyroidism, hyperlipidemia.    PCP: Claiborne Rigg, NP   Past Medical History:  Diagnosis Date   Acid reflux    Diabetes (HCC)    History of anemia    History of hiatal hernia    Hypercholesterolemia    Hypertension    Jaw inflammation, right 01/30/2017   Past Surgical History:  Procedure Laterality Date   COLONOSCOPY N/A 03/05/2018   Procedure: COLONOSCOPY;  Surgeon: West Bali, MD;  [facility];  Service: Endoscopy;  Laterality: N/A;  12:00pm   COLONOSCOPY N/A 03/06/2018   Procedure: COLONOSCOPY;  Surgeon: West Bali, MD;  [facility];  Service: Endoscopy;  Laterality: N/A;   DENTAL SURGERY     ESOPHAGOGASTRODUODENOSCOPY  2011   white, raised, somewhat fixed plaques esophageal mucosa concerning for possible Candida esophagitis s/p brushing. Multiple gastric antral ulcerations, multiple small bulbar erosions and ulcerations.    ESOPHAGOGASTRODUODENOSCOPY N/A 03/05/2018   Procedure: ESOPHAGOGASTRODUODENOSCOPY (EGD);  Surgeon: West Bali, MD;  [facility];  Service: Endoscopy;  Laterality: N/A;   ESOPHAGOGASTRODUODENOSCOPY N/A 03/06/2018   Procedure: ESOPHAGOGASTRODUODENOSCOPY (EGD);  Surgeon: West Bali, MD;  [facility];  Service: Endoscopy;  Laterality: N/A;   TOOTH EXTRACTION N/A 02/01/2017   Procedure: FULL DENTAL EXTRACTIONS;  Surgeon: Ocie Doyne, DDS;  Location: [facility] OR;  Service: Oral Surgery;  Laterality: N/A;   TUBAL LIGATION     Social History   Socioeconomic History   Marital status: Single    Spouse name: Not on file   Number of children: Not on file   Years of education: Not on  file   Highest education level: Not on file  Occupational History   Not on file  Tobacco Use   Smoking status: Every Day    Current packs/day: 0.50    Average packs/day: 0.5 packs/day for 15.0 years (7.5 ttl pk-yrs)    Types: Cigarettes   Smokeless tobacco: Never  Vaping Use   Vaping status: Never Used  Substance and Sexual Activity   Alcohol use: No   Drug use: No   Sexual activity: Not Currently  Other Topics Concern   Not on file  Social History Narrative   Not on file   Social Determinants of Health   Financial Resource Strain: Not on file  Food Insecurity: Not on file  Transportation Needs: Not on file  Physical Activity: Not on file  Stress: Not on file  Social Connections: Not on file   Outpatient Encounter Medications as of 06/18/2023  Medication Sig   amLODipine (NORVASC) 10 MG tablet Take 1 tablet (10 mg total) by mouth daily.   atorvastatin (LIPITOR) 40 MG tablet Take 1 tablet (40 mg total) by mouth daily.   azelastine (ASTELIN) 0.1 % nasal spray Place 1 spray into both nostrils 2 (two) times daily as needed  for rhinitis. Use in each nostril as directed   Cholecalciferol (VITAMIN D) 125 MCG (5000 UT) CAPS Take 5,000 Int'l Units/kg by mouth daily.   Coenzyme Q10 (CO Q 10 PO) Take by mouth daily.   cyclobenzaprine (FLEXERIL) 5 MG tablet Take 1 tablet (5 mg total) by mouth 3 (three) times daily as needed for muscle spasms.   fluticasone (FLONASE) 50 MCG/ACT nasal spray Place 2 sprays into both nostrils daily.   hydrALAZINE (APRESOLINE) 50 MG tablet Take 1 tablet (50 mg total) by mouth 2 (two) times daily.   JARDIANCE 10 MG TABS tablet Take 10 mg by mouth every morning.   loratadine (CLARITIN) 10 MG tablet Take 1 tablet (10 mg total) by mouth daily as needed for rhinitis.   losartan-hydrochlorothiazide (HYZAAR) 100-25 MG tablet Take 1 tablet by mouth once daily   Omega-3 Fatty Acids (FISH OIL PO) Take by mouth 2 (two) times a day.   pantoprazole (PROTONIX) 40 MG  tablet Take 1 tablet (40 mg total) by mouth daily.   sodium bicarbonate 650 MG tablet Take 650 mg by mouth 2 (two) times daily.   vitamin B-12 (CYANOCOBALAMIN) 500 MCG tablet Take 500 mcg by mouth daily.   [DISCONTINUED] levothyroxine (SYNTHROID) 50 MCG tablet TAKE 1 TABLET BY MOUTH ONCE DAILY BEFORE BREAKFAST   Accu-Chek Softclix Lancets lancets Patient is to check her blood sugar daily before breakfast   Blood Glucose Monitoring Suppl (ACCU-CHEK GUIDE ME) w/Device KIT Use to check glucose twice daily   glucose blood (ACCU-CHEK GUIDE) test strip Patient is to check blood sugar daily before breakfast   levothyroxine (SYNTHROID) 50 MCG tablet TAKE 1 TABLET BY MOUTH ONCE DAILY BEFORE BREAKFAST   metoprolol tartrate (LOPRESSOR) 50 MG tablet Take 1 tablet (50 mg total) by mouth 2 (two) times daily. (Patient not taking: Reported on 06/18/2023)   TRUEplus Lancets 28G MISC Use as directed.   [DISCONTINUED] dapagliflozin propanediol (FARXIGA) 10 MG TABS tablet Take 10 mg by mouth daily.   No facility-administered encounter medications on file as of 06/18/2023.    ALLERGIES: Allergies  Allergen Reactions   Aspirin Other (See Comments)    Mouth, upper lip, nose area becomes numb   Morphine And Codeine Other (See Comments)    "Did not feel good"   Other     Cats   Peach Flavor     Mouth and body itches.    Penicillins Other (See Comments)    Unknown Has patient had a PCN reaction causing immediate rash, facial/tongue/throat swelling, SOB or lightheadedness with hypotension: Unknown Has patient had a PCN reaction causing severe rash involving mucus membranes or skin necrosis: Unknown Has patient had a PCN reaction that required hospitalization: No Has patient had a PCN reaction occurring within the last 10 years: No If all of the above answers are "NO", then may proceed with Cephalosporin use.     VACCINATION STATUS: Immunization History  Administered Date(s) Administered   Influenza Whole  05/15/2019   Influenza,inj,Quad PF,6+ Mos 09/10/2018   Pneumococcal Polysaccharide-23 09/10/2018   Tdap 09/10/2018   Zoster Recombinant(Shingrix) 02/05/2022    Diabetes She presents for her follow-up diabetic visit. She has type 2 diabetes mellitus. Onset time: She reports that she was diagnosed at approximate age of 27 years. Her disease course has been improving. There are no hypoglycemic associated symptoms. Pertinent negatives for hypoglycemia include no confusion, headaches, pallor or seizures. Associated symptoms include polydipsia and polyuria. Pertinent negatives for diabetes include no blurred vision, no chest pain,  no fatigue and no polyphagia. There are no hypoglycemic complications. Symptoms are stable. Diabetic complications include nephropathy. Risk factors for coronary artery disease include diabetes mellitus, dyslipidemia, family history, hypertension, sedentary lifestyle, tobacco exposure and post-menopausal. Current diabetic treatment includes oral agent (monotherapy) (Jardiance by nephrology). She is compliant with treatment most of the time. Her weight is decreasing steadily. She is following a generally unhealthy diet. When asked about meal planning, she reported none. She has not had a previous visit with a dietitian. She rarely participates in exercise. Her home blood glucose trend is fluctuating minimally. Her overall blood glucose range is 130-140 mg/dl. (She presents today with her meter, no logs, showing inconsistent glucose monitoring with at goal readings.  Her POCT A1c today is 6.4%, increasing from last visit of 5.9%.  She notes her nephrologist put her on Jardiance and stopped her Glipizide.  She denies any hypoglycemia.  She admits she could do better with her diet.) An ACE inhibitor/angiotensin II receptor blocker is being taken. She does not see a podiatrist.Eye exam is current.  Hyperlipidemia This is a chronic problem. The current episode started more than 1 year ago.  The problem is uncontrolled. Recent lipid tests were reviewed and are high. Exacerbating diseases include chronic renal disease, diabetes and hypothyroidism. Factors aggravating her hyperlipidemia include beta blockers, fatty foods, thiazides and smoking. Pertinent negatives include no chest pain, myalgias or shortness of breath. Current antihyperlipidemic treatment includes statins (cannot remember if she has been taking it lately). The current treatment provides mild improvement of lipids. Compliance problems include adherence to exercise and adherence to diet.  Risk factors for coronary artery disease include family history, dyslipidemia, diabetes mellitus, hypertension, a sedentary lifestyle and post-menopausal.  Hypertension This is a chronic problem. The current episode started more than 1 year ago. The problem is unchanged. The problem is uncontrolled. Pertinent negatives include no blurred vision, chest pain, headaches, palpitations or shortness of breath. Agents associated with hypertension include thyroid hormones. Risk factors for coronary artery disease include diabetes mellitus, dyslipidemia and sedentary lifestyle. Past treatments include angiotensin blockers, central alpha agonists and diuretics. The current treatment provides no improvement. Compliance problems include exercise and diet.  Hypertensive end-organ damage includes kidney disease. Identifiable causes of hypertension include chronic renal disease.     Review of systems  Constitutional: + decreasing body weight,  current Body mass index is 25.28 kg/m. , + fatigue, no subjective hyperthermia, no subjective hypothermia Eyes: no blurry vision, no xerophthalmia ENT: no sore throat, no nodules palpated in throat, no dysphagia/odynophagia, no hoarseness Cardiovascular: no chest pain, no shortness of breath, no palpitations, no leg swelling Respiratory: no cough, no shortness of breath Gastrointestinal: no  nausea/vomiting/diarrhea Musculoskeletal: no muscle/joint aches Skin: no rashes, no hyperemia Neurological: no tremors, no numbness, no tingling, no dizziness Psychiatric: no depression, no anxiety   Objective:    BP (!) 152/70   Pulse 80   Ht 5' 6.5" (1.689 m)   Wt 159 lb (72.1 kg)   LMP 08/13/2014 (Approximate)   BMI 25.28 kg/m   Wt Readings from Last 3 Encounters:  06/18/23 159 lb (72.1 kg)  05/23/23 161 lb (73 kg)  03/18/23 162 lb 3.2 oz (73.6 kg)     BP Readings from Last 3 Encounters:  06/18/23 (!) 152/70  05/23/23 (!) 176/85  03/18/23 (!) 150/72     Physical Exam- Limited  Constitutional:  Body mass index is 25.28 kg/m. , not in acute distress, normal state of mind Eyes:  EOMI, no  exophthalmos Musculoskeletal: no gross deformities, strength intact in all four extremities, no gross restriction of joint movements Skin:  no rashes, no hyperemia Neurological: no tremor with outstretched hands   Diabetic Foot Exam - Simple   No data filed     CMP ( most recent) CMP     Component Value Date/Time   NA 140 02/20/2023 1025   K 4.5 02/20/2023 1025   CL 106 02/20/2023 1025   CO2 18 (L) 02/20/2023 1025   GLUCOSE 121 (H) 02/20/2023 1025   GLUCOSE 162 (H) 08/01/2021 1211   BUN 34 (H) 02/20/2023 1025   CREATININE 2.11 (H) 02/20/2023 1025   CREATININE 2.00 (H) 06/01/2020 1620   CALCIUM 9.3 02/20/2023 1025   PROT 6.6 10/30/2022 1212   ALBUMIN 4.4 10/30/2022 1212   AST 13 10/30/2022 1212   ALT 17 10/30/2022 1212   ALKPHOS 98 10/30/2022 1212   BILITOT 0.3 10/30/2022 1212   GFRNONAA 31 (L) 08/01/2021 1211   GFRNONAA 26 (L) 06/01/2020 1620   GFRAA 35 (L) 09/27/2020 1215   GFRAA 31 (L) 06/01/2020 1620     Diabetic Labs (most recent): Lab Results  Component Value Date   HGBA1C 6.4 (A) 06/18/2023   HGBA1C 5.9 (A) 03/18/2023   HGBA1C 6.2 (H) 10/30/2022   MICROALBUR 138.5 06/01/2020   MICROALBUR 1,036.3 (H) 07/04/2018     Lipid Panel ( most recent) Lipid  Panel     Component Value Date/Time   CHOL 223 (H) 10/30/2022 1212   TRIG 181 (H) 10/30/2022 1212   HDL 60 10/30/2022 1212   CHOLHDL 3.7 10/30/2022 1212   CHOLHDL 5.9 08/01/2021 1211   VLDL 50 (H) 08/01/2021 1211   LDLCALC 131 (H) 10/30/2022 1212       Assessment & Plan:   1) 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  She presents today with no meter or logs to review, has not been monitoring glucose with her own meter as it is in a storage unit from recent move.  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  Her POCT A1c today is 5.9%, essentially unchanged from previous visit.  She does have some episodes of hypoglycemia, associated with meal timing.  Her nephrologist did start her on Farxiga since her last visit with me.  -her diabetes is complicated by stage 4 renal insufficiency, sedentary life, heavy smoking and she remains at extremely high risk for more acute and chronic complications which include CAD, CVA, CKD, retinopathy, and neuropathy. These are all discussed in detail with her.  - Nutritional counseling repeated at each appointment due to patients tendency to fall back in to old habits.  - The patient admits there is a room for improvement in their diet and drink choices. -  Suggestion is made for the patient to avoid simple carbohydrates from their diet including Cakes, Sweet Desserts / Pastries, Ice Cream, Soda (diet and regular), Sweet Tea, Candies, Chips, Cookies, Sweet Pastries, Store Bought Juices, Alcohol in Excess of 1-2 drinks a day, Artificial Sweeteners, Coffee Creamer, and "Sugar-free" Products. This will help patient to have stable blood glucose profile and potentially avoid unintended weight gain.   - I encouraged the patient to switch to unprocessed or  minimally processed complex starch and increased protein intake (animal or plant source),  fruits, and vegetables.   - Patient is advised to stick to a routine mealtimes to eat 3 meals a day and avoid unnecessary snacks (to snack only to correct hypoglycemia).  -Given the lack of meter or logs to review today, no changes will be made to her medication regimen.  She is advised to continue Jardiance 10 mg po daily with breakfast per nephrology recommendations.   -She is advised to start monitoring fasting blood glucose at 1-2 times per week,  and call the clinic if she has readings less than 70 or greater than 300 for 3 tests in a row.   2) Lipids/HPL: Her most recent lipid panel from 10/30/22 shows uncontrolled LDL of 131 (improving) and elevated triglycerides of 181- improving.  She is advised to restart Lipitor 40 mg po daily at bedtime and Fish oil supplement- refill sent in today.  Side effects and precautions discussed with her.  3) HTN Her blood pressure is not controlled to target.  Will defer any med changes to nephrology or PCP.  4) Hypothyroidism: Her previsit TFTs are consistent with appropriate hormone replacement.  She is advised to continue Levothyroxine 50 mcg po daily before breakfast.   - We discussed about the correct intake of her thyroid hormone, on empty stomach at fasting, with water, separated by at least 30 minutes from breakfast and other medications,  and separated by more than 4 hours from calcium, iron, multivitamins, acid reflux medications (PPIs). -Patient is made aware of the fact that thyroid hormone replacement is needed for life, dose to be adjusted by periodic monitoring of thyroid function tests.  5) Chronic heavy smoking: The patient was counseled on the dangers of tobacco use, and was advised to quit.  Reviewed strategies to maximize success, including removing cigarettes and smoking materials from environment.  6) Weight management:  Her Body mass index is  25.28 kg/m.  She is not a candidate for major weight loss.  Exercise and carbs regimen discussed in detail with her.   - I advised patient to maintain close follow up with Claiborne Rigg, NP for primary care needs.     I spent  25  minutes in the care of the patient today including review of labs from CMP, Lipids, Thyroid Function, Hematology (current and previous including abstractions from other facilities); face-to-face time discussing  her blood glucose readings/logs, discussing hypoglycemia and hyperglycemia episodes and symptoms, medications doses, her options of short and long term treatment based on the latest standards of care / guidelines;  discussion about incorporating lifestyle medicine;  and documenting the encounter. Risk reduction counseling performed per USPSTF guidelines to reduce obesity and cardiovascular risk factors.     Please refer to Patient Instructions for Blood Glucose Monitoring and Insulin/Medications Dosing Guide"  in media tab for additional information. Please  also refer to " Patient Self Inventory" in the Media  tab for reviewed elements of pertinent patient history.  Sydney Morrow participated in the discussions, expressed understanding, and voiced agreement with the above plans.  All questions were answered to her satisfaction. she is encouraged to contact clinic should she have any questions or concerns prior to her return visit.   Follow up plan: - Return in about 4 months (around 10/16/2023) for Diabetes F/U with A1c in office, No previsit labs, Thyroid follow up.  Ronny Bacon, [facility] [address] [place], [place] [address] Phone: [phone] Fax: [phone]  06/18/2023, 11:06 AM

## 2023-06-18 NOTE — Patient Instructions (Signed)

## 2023-07-14 DIAGNOSIS — Z419 Encounter for procedure for purposes other than remedying health state, unspecified: Secondary | ICD-10-CM | POA: Diagnosis not present

## 2023-08-17 ENCOUNTER — Other Ambulatory Visit: Payer: Self-pay | Admitting: Nurse Practitioner

## 2023-08-17 DIAGNOSIS — B9689 Other specified bacterial agents as the cause of diseases classified elsewhere: Secondary | ICD-10-CM

## 2023-08-19 ENCOUNTER — Telehealth: Payer: Self-pay

## 2023-08-19 ENCOUNTER — Other Ambulatory Visit: Payer: Self-pay

## 2023-08-19 NOTE — Telephone Encounter (Signed)
 Pharmacy Patient Advocate Encounter  Received notification from [facility] that Prior Authorization for PANTOPRAZOLE  has been APPROVED from 08/19/2023 to 08/18/2024   PA #/Case ID/Reference #: [id number]  TJOFJMU HAS BEEN NOTIFIED AND SUCCESSFULLY PROCESSED PRESCRIPTION THROUGH INSURANCE.

## 2023-08-20 ENCOUNTER — Ambulatory Visit: Payer: Commercial Managed Care - HMO | Admitting: Nurse Practitioner

## 2023-09-05 DIAGNOSIS — N189 Chronic kidney disease, unspecified: Secondary | ICD-10-CM | POA: Diagnosis not present

## 2023-09-05 DIAGNOSIS — I10 Essential (primary) hypertension: Secondary | ICD-10-CM | POA: Diagnosis not present

## 2023-09-05 DIAGNOSIS — R809 Proteinuria, unspecified: Secondary | ICD-10-CM | POA: Diagnosis not present

## 2023-09-05 DIAGNOSIS — E1122 Type 2 diabetes mellitus with diabetic chronic kidney disease: Secondary | ICD-10-CM | POA: Diagnosis not present

## 2023-09-05 DIAGNOSIS — E1129 Type 2 diabetes mellitus with other diabetic kidney complication: Secondary | ICD-10-CM | POA: Diagnosis not present

## 2023-09-05 DIAGNOSIS — E8722 Chronic metabolic acidosis: Secondary | ICD-10-CM | POA: Diagnosis not present

## 2023-09-05 DIAGNOSIS — N184 Chronic kidney disease, stage 4 (severe): Secondary | ICD-10-CM | POA: Diagnosis not present

## 2023-09-13 ENCOUNTER — Other Ambulatory Visit: Payer: Self-pay | Admitting: Nurse Practitioner

## 2023-09-13 DIAGNOSIS — I10 Essential (primary) hypertension: Secondary | ICD-10-CM

## 2023-09-13 NOTE — Telephone Encounter (Signed)
Requested medication (s) are due for refill today: Yes  Requested medication (s) are on the active medication list: Yes  Last refill:  10/30/22  Future visit scheduled: Yes  Notes to clinic:  Unable to refill per protocol due to failed labs, no updated results.      Requested Prescriptions  Pending Prescriptions Disp Refills   hydrALAZINE (APRESOLINE) 50 MG tablet [Pharmacy Med Name: hydrALAZINE HCl 50 MG Oral Tablet] 180 tablet 0    Sig: Take 1 tablet by mouth twice daily     Cardiovascular:  Vasodilators Failed - 09/13/2023 12:32 PM      Failed - HCT in normal range and within 360 days    Hematocrit  Date Value Ref Range Status  11/03/2021 43.2 34.0 - 46.6 % Final         Failed - HGB in normal range and within 360 days    Hemoglobin  Date Value Ref Range Status  11/03/2021 14.8 11.1 - 15.9 g/dL Final         Failed - RBC in normal range and within 360 days    RBC  Date Value Ref Range Status  11/03/2021 4.66 3.77 - 5.28 x10E6/uL Final  03/06/2018 4.57 3.87 - 5.11 MIL/uL Final         Failed - WBC in normal range and within 360 days    WBC  Date Value Ref Range Status  11/03/2021 5.7 3.4 - 10.8 x10E3/uL Final  03/06/2018 6.4 4.0 - 10.5 K/uL Final         Failed - PLT in normal range and within 360 days    Platelets  Date Value Ref Range Status  11/03/2021 209 150 - 450 x10E3/uL Final         Failed - ANA Screen, Ifa, Serum in normal range and within 360 days    No results found for: "ANA", "ANATITER", "LABANTI"       Failed - Last BP in normal range    BP Readings from Last 1 Encounters:  06/18/23 (!) 152/70         Passed - Valid encounter within last 12 months    Recent Outpatient Visits           10 months ago Type 2 diabetes mellitus with stage 3a chronic kidney disease, without long-term current use of insulin (HCC)   [facility] [place] Claiborne Rigg, NP   1 year ago Essential  hypertension   [facility] [place]. [facility] Claiborne Rigg, NP   1 year ago Encounter for Papanicolaou smear for cervical cancer screening   [facility] [place]. [facility] Claiborne Rigg, NP   2 years ago Essential hypertension   [facility] [place]. [facility] Drucilla Chalet, RPH-CPP   2 years ago Essential hypertension   [place] [place] [facility] [place]. [facility] Claiborne Rigg, NP       Future Appointments             In 1 week Claiborne Rigg, NP [facility] Of [place] [place]. [facility]

## 2023-09-20 ENCOUNTER — Telehealth: Payer: Self-pay | Admitting: Nurse Practitioner

## 2023-09-20 NOTE — Telephone Encounter (Signed)
 Contacted pt left vm to confirn appt! 8:30am

## 2023-09-25 ENCOUNTER — Ambulatory Visit: Payer: Commercial Managed Care - HMO | Admitting: Nurse Practitioner

## 2023-10-01 ENCOUNTER — Other Ambulatory Visit: Payer: Self-pay | Admitting: Nurse Practitioner

## 2023-10-01 DIAGNOSIS — I10 Essential (primary) hypertension: Secondary | ICD-10-CM

## 2023-10-01 MED ORDER — HYDRALAZINE HCL 50 MG PO TABS: 50.0000 mg | ORAL_TABLET | Freq: Two times a day (BID) | ORAL | 0 refills | Status: DC

## 2023-10-02 ENCOUNTER — Telehealth: Payer: Self-pay

## 2023-10-02 NOTE — Telephone Encounter (Signed)
 1st attempt to return phone call  to patient per request of patient to access [facility] per referral form [facility]    Plan  Left a voicemail message for patient to return call so that she may get assistance with getting connected with the progam

## 2023-10-09 ENCOUNTER — Ambulatory Visit: Payer: 59 | Attending: Physician Assistant | Admitting: Physician Assistant

## 2023-10-09 VITALS — BP 160/60 | HR 90 | Temp 97.7°F | Ht 66.0 in | Wt 163.0 lb

## 2023-10-09 DIAGNOSIS — E1122 Type 2 diabetes mellitus with diabetic chronic kidney disease: Secondary | ICD-10-CM

## 2023-10-09 DIAGNOSIS — E1169 Type 2 diabetes mellitus with other specified complication: Secondary | ICD-10-CM

## 2023-10-09 DIAGNOSIS — N1831 Chronic kidney disease, stage 3a: Secondary | ICD-10-CM | POA: Diagnosis not present

## 2023-10-09 DIAGNOSIS — B9689 Other specified bacterial agents as the cause of diseases classified elsewhere: Secondary | ICD-10-CM

## 2023-10-09 DIAGNOSIS — E7800 Pure hypercholesterolemia, unspecified: Secondary | ICD-10-CM

## 2023-10-09 DIAGNOSIS — I10 Essential (primary) hypertension: Secondary | ICD-10-CM

## 2023-10-09 DIAGNOSIS — J329 Chronic sinusitis, unspecified: Secondary | ICD-10-CM

## 2023-10-09 LAB — GLUCOSE, POCT (MANUAL RESULT ENTRY): POC Glucose: 128 mg/dL — AB (ref 70–99)

## 2023-10-09 MED ORDER — FLUTICASONE PROPIONATE 50 MCG/ACT NA SUSP: 1.0000 | Freq: Every day | NASAL | 0 refills | Status: DC

## 2023-10-09 MED ORDER — LOSARTAN POTASSIUM-HCTZ 100-25 MG PO TABS: 0.5000 | ORAL_TABLET | Freq: Every day | ORAL | 1 refills | Status: AC

## 2023-10-09 MED ORDER — AMLODIPINE BESYLATE 10 MG PO TABS: 10.0000 mg | ORAL_TABLET | Freq: Every day | ORAL | 1 refills | Status: DC

## 2023-10-09 MED ORDER — AZITHROMYCIN 250 MG PO TABS: ORAL_TABLET | ORAL | 0 refills | Status: AC

## 2023-10-09 MED ORDER — ATORVASTATIN CALCIUM 40 MG PO TABS: 40.0000 mg | ORAL_TABLET | Freq: Every day | ORAL | 3 refills | Status: AC

## 2023-10-09 NOTE — Patient Instructions (Signed)
 Check your blood pressure daily and record and take to your next doctor's appt.

## 2023-10-09 NOTE — Progress Notes (Signed)
 Patient ID: Sydney Morrow, female   DOB: 01/10/1961, 63 y.o.   MRN: [id number]   Sydney Morrow, is a 63 y.o. female  [id number]  [id number]  DOB - 1960/10/18  Chief Complaint  Patient presents with   Diabetes    Dm & HTN f/u. Reports being taken off of glipizide. [name] is not covered       Subjective:   Sydney Morrow is a 63 y.o. female here today for med RF.  She is followed by endocrine for DM and nephrology for CKD.  She says she checks her BP at home but tells me numbers like 110, 90, 80, 70.  She does not give a systolic and diastolic number and is unsure of these numbers are top or bottom numbers.  She says she thought it was 1 number.  Denies CP/SOB.    Sinus pain and pressure for weeks now and thick green drainage.  No problems updated.  ALLERGIES: Allergies  Allergen Reactions   Aspirin Other (See Comments)    Mouth, upper lip, nose area becomes numb   Morphine And Codeine Other (See Comments)    "Did not feel good"   Other     Cats   Peach Flavoring Agent (Non-Screening)     Mouth and body itches.    Penicillins Other (See Comments)    Unknown Has patient had a PCN reaction causing immediate rash, facial/tongue/throat swelling, SOB or lightheadedness with hypotension: Unknown Has patient had a PCN reaction causing severe rash involving mucus membranes or skin necrosis: Unknown Has patient had a PCN reaction that required hospitalization: No Has patient had a PCN reaction occurring within the last 10 years: No If all of the above answers are "NO", then may proceed with Cephalosporin use.     PAST MEDICAL HISTORY: Past Medical History:  Diagnosis Date   Acid reflux    Diabetes (HCC)    History of anemia    History of hiatal hernia    Hypercholesterolemia    Hypertension    Jaw inflammation, right 01/30/2017    MEDICATIONS AT HOME: Prior to Admission medications   Medication Sig Start Date End Date Taking? Authorizing Provider  Accu-Chek  Softclix Lancets lancets Patient is to check her blood sugar daily before breakfast 03/19/23  Yes Reardon, Alphonzo Lemmings J, NP  azelastine (ASTELIN) 0.1 % nasal spray Place 1 spray into both nostrils 2 (two) times daily as needed for rhinitis. Use in each nostril as directed 12/03/22  Yes Birder Robson, MD  Blood Glucose Monitoring Suppl (ACCU-CHEK GUIDE ME) w/Device KIT Use to check glucose twice daily 03/18/23  Yes Reardon, Freddi Starr, NP  Cholecalciferol (VITAMIN D) 125 MCG (5000 UT) CAPS Take 5,000 Int'l Units/kg by mouth daily.   Yes [provider]  Coenzyme Q10 (CO Q 10 PO) Take by mouth daily.   Yes [provider]  cyclobenzaprine (FLEXERIL) 5 MG tablet Take 1 tablet (5 mg total) by mouth 3 (three) times daily as needed for muscle spasms. 05/23/23  Yes Mayers, Cari S, PA-C  glucose blood (ACCU-CHEK GUIDE) test strip Patient is to check blood sugar daily before breakfast 03/19/23  Yes Reardon, Freddi Starr, NP  levothyroxine (SYNTHROID) 50 MCG tablet TAKE 1 TABLET BY MOUTH ONCE DAILY BEFORE BREAKFAST 06/18/23  Yes Dani Gobble, NP  loratadine (CLARITIN) 10 MG tablet Take 1 tablet (10 mg total) by mouth daily as needed for rhinitis. 12/03/22  Yes Birder Robson, MD  Omega-3 Fatty Acids (FISH OIL PO)  Take by mouth 2 (two) times a day.   Yes [provider]  pantoprazole (PROTONIX) 40 MG tablet Take 1 tablet (40 mg total) by mouth daily. 10/30/22  Yes Claiborne Rigg, NP  sodium bicarbonate 650 MG tablet Take 650 mg by mouth 2 (two) times daily. 02/21/23  Yes [provider]  TRUEplus Lancets 28G MISC Use as directed. 10/30/22  Yes Claiborne Rigg, NP  vitamin B-12 (CYANOCOBALAMIN) 500 MCG tablet Take 500 mcg by mouth daily.   Yes [provider]  amLODipine (NORVASC) 10 MG tablet Take 1 tablet (10 mg total) by mouth daily. 10/09/23   Anders Simmonds, PA-C  atorvastatin (LIPITOR) 40 MG tablet Take 1 tablet (40 mg total) by mouth daily. 10/09/23   Anders Simmonds,  PA-C  fluticasone (FLONASE) 50 MCG/ACT nasal spray Place 1 spray into both nostrils daily. 10/09/23   Anders Simmonds, PA-C  hydrALAZINE (APRESOLINE) 50 MG tablet Take 1 tablet (50 mg total) by mouth 2 (two) times daily. 10/01/23   Claiborne Rigg, NP  JARDIANCE 10 MG TABS tablet Take 10 mg by mouth every morning. Patient not taking: Reported on 10/09/2023    [provider]  losartan-hydrochlorothiazide (HYZAAR) 100-25 MG tablet Take 0.5 tablets by mouth daily. 10/09/23   Anders Simmonds, PA-C  metoprolol tartrate (LOPRESSOR) 50 MG tablet Take 1 tablet (50 mg total) by mouth 2 (two) times daily. Patient not taking: Reported on 10/09/2023 10/30/22   Claiborne Rigg, NP    ROS: Neg HEENT Neg resp Neg cardiac Neg GI Neg GU Neg MS Neg psych Neg neuro  Objective:   Vitals:   10/09/23 1059 10/09/23 1119  BP: (!) 193/64 (!) 160/60  Pulse: 90   Temp: 97.7 F (36.5 C)   TempSrc: Oral   SpO2: 98%   Weight: 163 lb (73.9 kg)   Height: 5\' 6"  (1.676 m)    Exam General appearance : Awake, alert, not in any distress. Speech Clear. Not toxic looking HEENT: Atraumatic and Normocephalic Neck: Supple, no JVD. No cervical lymphadenopathy.  Chest: Good air entry bilaterally, CTAB.  No rales/rhonchi/wheezing CVS: S1 S2 regular, no murmurs.  Extremities: B/L Lower Ext shows no edema, both legs are warm to touch Neurology: Awake alert, and oriented X 3, CN II-XII intact, Non focal Skin: No Rash  Data Review Lab Results  Component Value Date   HGBA1C 6.4 (A) 06/18/2023   HGBA1C 5.9 (A) 03/18/2023   HGBA1C 6.2 (H) 10/30/2022    Assessment & Plan   1. Type 2 diabetes mellitus with stage 3a chronic kidney disease, without long-term current use of insulin (HCC) (Primary) Followed by endocrine - Glucose (CBG)  2. Primary hypertension She is also on hydralazine too.  Encouraged her to check BP at home and record numbers on machine and take to next appt.   - amLODipine (NORVASC)  10 MG tablet; Take 1 tablet (10 mg total). by mouth daily.  Dispense: 90 tablet; Refill: 1 - losartan-hydrochlorothiazide (HYZAAR) 100-25 MG tablet; Take 0.5 tablets by mouth daily.  Dispense: 45 tablet; Refill: 1  3. Type 2 diabetes mellitus with hypercholesterolemia (HCC) - atorvastatin (LIPITOR) 40 MG tablet; Take 1 tablet (40 mg total) by mouth daily.  Dispense: 90 tablet; Refill: 3  4. Bacterial sinusitis - fluticasone (FLONASE) 50 MCG/ACT nasal spray; Place 1 spray into both nostrils daily.  Dispense: 16 g; Refill: 0 -zpack   Return in about 4 months (around 02/06/2024) for PCP for chronic conditions-[facility].  The patient was given clear instructions to go to ER or return to medical center if symptoms don't improve, worsen or new problems develop. The patient verbalized understanding. The patient was told to call to get lab results if they haven't heard anything in the next week.      Georgian Co, PA-C [facility] [place], [place] [phone]   10/09/2023, 11:23 AM

## 2023-10-12 DIAGNOSIS — Z419 Encounter for procedure for purposes other than remedying health state, unspecified: Secondary | ICD-10-CM | POA: Diagnosis not present

## 2023-10-16 ENCOUNTER — Ambulatory Visit: Payer: Medicaid Other | Admitting: Nurse Practitioner

## 2023-10-16 DIAGNOSIS — E039 Hypothyroidism, unspecified: Secondary | ICD-10-CM

## 2023-10-16 DIAGNOSIS — Z7984 Long term (current) use of oral hypoglycemic drugs: Secondary | ICD-10-CM

## 2023-10-16 DIAGNOSIS — I10 Essential (primary) hypertension: Secondary | ICD-10-CM

## 2023-10-16 DIAGNOSIS — E782 Mixed hyperlipidemia: Secondary | ICD-10-CM

## 2023-10-16 DIAGNOSIS — N184 Chronic kidney disease, stage 4 (severe): Secondary | ICD-10-CM

## 2023-11-21 DIAGNOSIS — I10 Essential (primary) hypertension: Secondary | ICD-10-CM | POA: Diagnosis not present

## 2023-11-21 DIAGNOSIS — E1122 Type 2 diabetes mellitus with diabetic chronic kidney disease: Secondary | ICD-10-CM | POA: Diagnosis not present

## 2023-11-21 DIAGNOSIS — E1129 Type 2 diabetes mellitus with other diabetic kidney complication: Secondary | ICD-10-CM | POA: Diagnosis not present

## 2023-11-21 DIAGNOSIS — R809 Proteinuria, unspecified: Secondary | ICD-10-CM | POA: Diagnosis not present

## 2023-11-21 DIAGNOSIS — E8722 Chronic metabolic acidosis: Secondary | ICD-10-CM | POA: Diagnosis not present

## 2023-11-21 DIAGNOSIS — N189 Chronic kidney disease, unspecified: Secondary | ICD-10-CM | POA: Diagnosis not present

## 2023-11-21 DIAGNOSIS — N184 Chronic kidney disease, stage 4 (severe): Secondary | ICD-10-CM | POA: Diagnosis not present

## 2023-11-23 DIAGNOSIS — Z419 Encounter for procedure for purposes other than remedying health state, unspecified: Secondary | ICD-10-CM | POA: Diagnosis not present

## 2023-11-25 ENCOUNTER — Telehealth: Payer: Self-pay | Admitting: *Deleted

## 2023-11-25 ENCOUNTER — Other Ambulatory Visit: Payer: Self-pay | Admitting: Nurse Practitioner

## 2023-11-25 DIAGNOSIS — Z1231 Encounter for screening mammogram for malignant neoplasm of breast: Secondary | ICD-10-CM

## 2023-11-25 NOTE — Telephone Encounter (Signed)
 Patient left message asking if she needed to have lab work prior to her May 30th appointment. In the last office visit, it was noted that the patient will not need revisit lab, A1C in office. Patient was called and made aware.

## 2023-12-18 ENCOUNTER — Other Ambulatory Visit: Payer: Self-pay | Admitting: Nurse Practitioner

## 2023-12-18 DIAGNOSIS — I10 Essential (primary) hypertension: Secondary | ICD-10-CM

## 2023-12-18 DIAGNOSIS — K219 Gastro-esophageal reflux disease without esophagitis: Secondary | ICD-10-CM

## 2023-12-23 DIAGNOSIS — Z419 Encounter for procedure for purposes other than remedying health state, unspecified: Secondary | ICD-10-CM | POA: Diagnosis not present

## 2024-01-01 ENCOUNTER — Other Ambulatory Visit: Payer: Self-pay | Admitting: Internal Medicine

## 2024-01-01 DIAGNOSIS — J3089 Other allergic rhinitis: Secondary | ICD-10-CM

## 2024-01-01 NOTE — Telephone Encounter (Signed)
 Courtesy refill for EQ all day allergy  relief 10 mg x 1 with no refills sent to [facility]. Patient is due for an OV.

## 2024-01-10 ENCOUNTER — Other Ambulatory Visit: Payer: Self-pay | Admitting: Physician Assistant

## 2024-01-10 ENCOUNTER — Ambulatory Visit (INDEPENDENT_AMBULATORY_CARE_PROVIDER_SITE_OTHER): Admitting: Nurse Practitioner

## 2024-01-10 ENCOUNTER — Encounter: Payer: Self-pay | Admitting: Nurse Practitioner

## 2024-01-10 VITALS — BP 138/88 | HR 76 | Ht 66.0 in | Wt 158.8 lb

## 2024-01-10 DIAGNOSIS — I10 Essential (primary) hypertension: Secondary | ICD-10-CM

## 2024-01-10 DIAGNOSIS — N1832 Chronic kidney disease, stage 3b: Secondary | ICD-10-CM | POA: Diagnosis not present

## 2024-01-10 DIAGNOSIS — E1122 Type 2 diabetes mellitus with diabetic chronic kidney disease: Secondary | ICD-10-CM

## 2024-01-10 DIAGNOSIS — E782 Mixed hyperlipidemia: Secondary | ICD-10-CM

## 2024-01-10 DIAGNOSIS — Z7984 Long term (current) use of oral hypoglycemic drugs: Secondary | ICD-10-CM | POA: Diagnosis not present

## 2024-01-10 DIAGNOSIS — B9689 Other specified bacterial agents as the cause of diseases classified elsewhere: Secondary | ICD-10-CM

## 2024-01-10 DIAGNOSIS — E039 Hypothyroidism, unspecified: Secondary | ICD-10-CM

## 2024-01-10 LAB — POCT GLYCOSYLATED HEMOGLOBIN (HGB A1C): Hemoglobin A1C: 6.1 % — AB (ref 4.0–5.6)

## 2024-01-10 MED ORDER — LEVOTHYROXINE SODIUM 50 MCG PO TABS: ORAL_TABLET | ORAL | 3 refills | Status: DC

## 2024-01-10 MED ORDER — JARDIANCE 10 MG PO TABS: 10.0000 mg | ORAL_TABLET | Freq: Every day | ORAL | 3 refills | Status: DC

## 2024-01-10 MED ORDER — METOPROLOL TARTRATE 50 MG PO TABS
50.0000 mg | ORAL_TABLET | Freq: Two times a day (BID) | ORAL | 1 refills | Status: AC
Start: 2024-01-10 — End: ?

## 2024-01-10 NOTE — Progress Notes (Signed)
 01/10/2024, 9:33 AM  Endocrinology follow-up note         Subjective:    Patient ID: Sydney Morrow, female    DOB: [birth year].  Sydney Morrow is engaged in follow-up for the management of  her currently controlled type 2 diabetes, hypothyroidism, hyperlipidemia.    PCP: Collins Dean, NP   Past Medical History:  Diagnosis Date   Acid reflux    Diabetes (HCC)    History of anemia    History of hiatal hernia    Hypercholesterolemia    Hypertension    Jaw inflammation, right 01/30/2017   Past Surgical History:  Procedure Laterality Date   COLONOSCOPY N/A 03/05/2018   Procedure: COLONOSCOPY;  Surgeon: Alyce Jubilee, MD;  [facility];  Service: Endoscopy;  Laterality: N/A;  12:00pm   COLONOSCOPY N/A 03/06/2018   Procedure: COLONOSCOPY;  Surgeon: Alyce Jubilee, MD;  Location: [facility];  Service: Endoscopy;  Laterality: N/A;   DENTAL SURGERY     ESOPHAGOGASTRODUODENOSCOPY  2011   white, raised, somewhat fixed plaques esophageal mucosa concerning for possible Candida esophagitis s/p brushing. Multiple gastric antral ulcerations, multiple small bulbar erosions and ulcerations.    ESOPHAGOGASTRODUODENOSCOPY N/A 03/05/2018   Procedure: ESOPHAGOGASTRODUODENOSCOPY (EGD);  Surgeon: Alyce Jubilee, MD;  Location: [facility];  Service: Endoscopy;  Laterality: N/A;   ESOPHAGOGASTRODUODENOSCOPY N/A 03/06/2018   Procedure: ESOPHAGOGASTRODUODENOSCOPY (EGD);  Surgeon: Alyce Jubilee, MD;  Location: [facility];  Service: Endoscopy;  Laterality: N/A;   TOOTH EXTRACTION N/A 02/01/2017   Procedure: FULL DENTAL EXTRACTIONS;  Surgeon: Ascencion Lava, DDS;  Location: [facility] OR;  Service: Oral Surgery;  Laterality: N/A;   TUBAL LIGATION     Social History   Socioeconomic History   Marital status: Single    Spouse name: Not on file   Number of children: Not on file   Years of education: Not on  file   Highest education level: Not on file  Occupational History   Not on file  Tobacco Use   Smoking status: Every Day    Current packs/day: 0.50    Average packs/day: 0.5 packs/day for 15.0 years (7.5 ttl pk-yrs)    Types: Cigarettes   Smokeless tobacco: Never  Vaping Use   Vaping status: Never Used  Substance and Sexual Activity   Alcohol  use: No   Drug use: No   Sexual activity: Not Currently  Other Topics Concern   Not on file  Social History Narrative   Not on file   Social Drivers of Health   Financial Resource Strain: Not on file  Food Insecurity: Not on file  Transportation Needs: Not on file  Physical Activity: Not on file  Stress: Not on file  Social Connections: Not on file   Outpatient Encounter Medications as of 01/10/2024  Medication Sig   Accu-Chek Softclix Lancets lancets Patient is to check her blood sugar daily before breakfast   amLODipine  (NORVASC ) 10 MG tablet Take 1 tablet (10 mg total) by mouth daily.   atorvastatin  (LIPITOR) 40 MG tablet Take 1 tablet (40 mg total) by mouth daily.   azelastine  (ASTELIN )  0.1 % nasal spray Place 1 spray into both nostrils 2 (two) times daily as needed for rhinitis. Use in each nostril as directed   Blood Glucose Monitoring Suppl (ACCU-CHEK GUIDE ME) w/Device KIT Use to check glucose twice daily   Cholecalciferol (VITAMIN D ) 125 MCG (5000 UT) CAPS Take 5,000 Int'l Units/kg by mouth daily.   Coenzyme Q10 (CO Q 10 PO) Take by mouth daily.   cyclobenzaprine  (FLEXERIL ) 5 MG tablet Take 1 tablet (5 mg total) by mouth 3 (three) times daily as needed for muscle spasms.   EQ ALL DAY ALLERGY  RELIEF 10 MG tablet TAKE 1 TABLET BY MOUTH ONCE DAILY AS NEEDED FOR RUNNY NOSE   fluticasone  (FLONASE ) 50 MCG/ACT nasal spray Place 1 spray into both nostrils daily.   glucose blood (ACCU-CHEK GUIDE) test strip Patient is to check blood sugar daily before breakfast   hydrALAZINE  (APRESOLINE ) 50 MG tablet Take 1 tablet by mouth twice daily    losartan -hydrochlorothiazide  (HYZAAR) 100-25 MG tablet Take 0.5 tablets by mouth daily.   Omega-3 Fatty Acids (FISH OIL PO) Take by mouth 2 (two) times a day.   pantoprazole  (PROTONIX ) 40 MG tablet Take 1 tablet by mouth once daily   sodium bicarbonate  650 MG tablet Take 650 mg by mouth 2 (two) times daily.   TRUEplus Lancets 28G MISC Use as directed.   vitamin B-12 (CYANOCOBALAMIN) 500 MCG tablet Take 500 mcg by mouth daily.   [DISCONTINUED] JARDIANCE 10 MG TABS tablet Take 10 mg by mouth every morning.   [DISCONTINUED] levothyroxine  (SYNTHROID ) 50 MCG tablet TAKE 1 TABLET BY MOUTH ONCE DAILY BEFORE BREAKFAST   [DISCONTINUED] metoprolol  tartrate (LOPRESSOR ) 50 MG tablet Take 1 tablet (50 mg total) by mouth 2 (two) times daily.   JARDIANCE 10 MG TABS tablet Take 1 tablet (10 mg total) by mouth daily.   levothyroxine  (SYNTHROID ) 50 MCG tablet TAKE 1 TABLET BY MOUTH ONCE DAILY BEFORE BREAKFAST   metoprolol  tartrate (LOPRESSOR ) 50 MG tablet Take 1 tablet (50 mg total) by mouth 2 (two) times daily.   No facility-administered encounter medications on file as of 01/10/2024.    ALLERGIES: Allergies  Allergen Reactions   Aspirin Other (See Comments)    Mouth, upper lip, nose area becomes numb   Morphine  And Codeine Other (See Comments)    "Did not feel good"   Other     Cats   Peach Flavoring Agent (Non-Screening)     Mouth and body itches.    Penicillins Other (See Comments)    Unknown Has patient had a PCN reaction causing immediate rash, facial/tongue/throat swelling, SOB or lightheadedness with hypotension: Unknown Has patient had a PCN reaction causing severe rash involving mucus membranes or skin necrosis: Unknown Has patient had a PCN reaction that required hospitalization: No Has patient had a PCN reaction occurring within the last 10 years: No If all of the above answers are "NO", then may proceed with Cephalosporin use.     VACCINATION STATUS: Immunization History   Administered Date(s) Administered   Influenza Whole 05/15/2019   Influenza,inj,Quad PF,6+ Mos 09/10/2018   Pneumococcal Polysaccharide-23 09/10/2018   Tdap 09/10/2018   Zoster Recombinant(Shingrix ) 02/05/2022    Diabetes She presents for her follow-up diabetic visit. She has type 2 diabetes mellitus. Onset time: She reports that she was diagnosed at approximate age of 24 years. Her disease course has been stable. There are no hypoglycemic associated symptoms. Pertinent negatives for hypoglycemia include no confusion, headaches, pallor or seizures. Associated symptoms include polydipsia and  polyuria. Pertinent negatives for diabetes include no blurred vision, no chest pain, no fatigue and no polyphagia. There are no hypoglycemic complications. Symptoms are stable. Diabetic complications include nephropathy. Risk factors for coronary artery disease include diabetes mellitus, dyslipidemia, family history, hypertension, sedentary lifestyle, tobacco exposure and post-menopausal. Current diabetic treatment includes oral agent (monotherapy) (Jardiance by nephrology). She is compliant with treatment most of the time. Her weight is fluctuating minimally. She is following a generally unhealthy diet. When asked about meal planning, she reported none. She has not had a previous visit with a dietitian. She rarely participates in exercise. (She presents today with no meter, no logs, was not asked to routinely monitor glucose due to safe medication regimen.  Her POCT A1c today is 6.1%, improving from last visit of 6.4%.  She denies any hypoglycemia.  She admits she could do better with her diet.) An ACE inhibitor/angiotensin II receptor blocker is being taken. She does not see a podiatrist.Eye exam is current.  Hyperlipidemia This is a chronic problem. The current episode started more than 1 year ago. The problem is uncontrolled. Recent lipid tests were reviewed and are high. Exacerbating diseases include chronic  renal disease, diabetes and hypothyroidism. Factors aggravating her hyperlipidemia include beta blockers, fatty foods, thiazides and smoking. Pertinent negatives include no chest pain, myalgias or shortness of breath. Current antihyperlipidemic treatment includes statins (cannot remember if she has been taking it lately). The current treatment provides mild improvement of lipids. Compliance problems include adherence to exercise and adherence to diet.  Risk factors for coronary artery disease include family history, dyslipidemia, diabetes mellitus, hypertension, a sedentary lifestyle and post-menopausal.  Hypertension This is a chronic problem. The current episode started more than 1 year ago. The problem is unchanged. The problem is uncontrolled. Pertinent negatives include no blurred vision, chest pain, headaches, palpitations or shortness of breath. Agents associated with hypertension include thyroid  hormones. Risk factors for coronary artery disease include diabetes mellitus, dyslipidemia and sedentary lifestyle. Past treatments include angiotensin blockers, central alpha agonists and diuretics. The current treatment provides no improvement. Compliance problems include exercise and diet.  Hypertensive end-organ damage includes kidney disease. Identifiable causes of hypertension include chronic renal disease.     Review of systems  Constitutional: + stable body weight,  current Body mass index is 25.63 kg/m. , + fatigue, no subjective hyperthermia, no subjective hypothermia Eyes: no blurry vision, no xerophthalmia ENT: no sore throat, no nodules palpated in throat, no dysphagia/odynophagia, no hoarseness Cardiovascular: no chest pain, no shortness of breath, no palpitations, no leg swelling Respiratory: no cough, no shortness of breath Gastrointestinal: no nausea/vomiting/diarrhea Musculoskeletal: no muscle/joint aches Skin: no rashes, no hyperemia Neurological: no tremors, no numbness, no  tingling, no dizziness Psychiatric: no depression, no anxiety   Objective:    BP 138/88 (BP Location: Left Arm, Patient Position: Sitting, Cuff Size: Large)   Pulse 76   Ht 5\' 6"  (1.676 m)   Wt 158 lb 12.8 oz (72 kg)   LMP 08/13/2014 (Approximate)   BMI 25.63 kg/m   Wt Readings from Last 3 Encounters:  01/10/24 158 lb 12.8 oz (72 kg)  10/09/23 163 lb (73.9 kg)  06/18/23 159 lb (72.1 kg)     BP Readings from Last 3 Encounters:  01/10/24 138/88  10/09/23 (!) 160/60  06/18/23 (!) 152/70     Physical Exam- Limited  Constitutional:  Body mass index is 25.63 kg/m. , not in acute distress, normal state of mind Eyes:  EOMI, no exophthalmos Musculoskeletal: no  gross deformities, strength intact in all four extremities, no gross restriction of joint movements Skin:  no rashes, no hyperemia Neurological: no tremor with outstretched hands   Diabetic Foot Exam - Simple   No data filed     CMP ( most recent) CMP     Component Value Date/Time   NA 140 02/20/2023 1025   K 4.5 02/20/2023 1025   CL 106 02/20/2023 1025   CO2 18 (L) 02/20/2023 1025   GLUCOSE 121 (H) 02/20/2023 1025   GLUCOSE 162 (H) 08/01/2021 1211   BUN 34 (H) 02/20/2023 1025   CREATININE 2.11 (H) 02/20/2023 1025   CREATININE 2.00 (H) 06/01/2020 1620   CALCIUM  9.3 02/20/2023 1025   PROT 6.6 10/30/2022 1212   ALBUMIN 4.4 10/30/2022 1212   AST 13 10/30/2022 1212   ALT 17 10/30/2022 1212   ALKPHOS 98 10/30/2022 1212   BILITOT 0.3 10/30/2022 1212   GFRNONAA 31 (L) 08/01/2021 1211   GFRNONAA 26 (L) 06/01/2020 1620   GFRAA 35 (L) 09/27/2020 1215   GFRAA 31 (L) 06/01/2020 1620     Diabetic Labs (most recent): Lab Results  Component Value Date   HGBA1C 6.1 (A) 01/10/2024   HGBA1C 6.4 (A) 06/18/2023   HGBA1C 5.9 (A) 03/18/2023   MICROALBUR 138.5 06/01/2020   MICROALBUR 1,036.3 (H) 07/04/2018     Lipid Panel ( most recent) Lipid Panel     Component Value Date/Time   CHOL 223 (H) 10/30/2022 1212    TRIG 181 (H) 10/30/2022 1212   HDL 60 10/30/2022 1212   CHOLHDL 3.7 10/30/2022 1212   CHOLHDL 5.9 08/01/2021 1211   VLDL 50 (H) 08/01/2021 1211   LDLCALC 131 (H) 10/30/2022 1212       Assessment & Plan:   1) 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  She presents today with no meter, no logs, was not asked to routinely monitor glucose due to safe medication regimen.  Her POCT A1c today is 6.1%, improving from last visit of 6.4%.  She denies any hypoglycemia.  She admits she could do better with her diet.  -her diabetes is complicated by stage 4 renal insufficiency, sedentary life, heavy smoking and she remains at extremely high risk for more acute and chronic complications which include CAD, CVA, CKD, retinopathy, and neuropathy. These are all discussed in detail with her.  - Nutritional counseling repeated at each appointment due to patients tendency to fall back in to old habits.  - The patient admits there is a room for improvement in their diet and drink choices. -  Suggestion is made for the patient to avoid simple carbohydrates from their diet including Cakes, Sweet Desserts / Pastries, Ice Cream, Soda (diet and regular), Sweet Tea, Candies, Chips, Cookies, Sweet Pastries, Store Bought Juices, Alcohol  in Excess of 1-2 drinks a day, Artificial Sweeteners, Coffee Creamer, and "Sugar-free" Products. This will help patient to have stable blood glucose profile and potentially avoid unintended weight gain.   - I encouraged the patient to switch to unprocessed or minimally processed complex starch and increased protein intake (animal or plant source), fruits, and vegetables.   - Patient is advised to stick to a routine mealtimes to eat 3 meals a day and avoid unnecessary snacks (to snack only to correct hypoglycemia).  -Given her  stable glycemic profile overall, no changes will  be made to her regimen today.  She is advised to continue Jardiance 10 mg po daily with breakfast per nephrology recommendations.   -She can take a break from routine glucose monitoring due to safe regimen.  2) Lipids/HPL: Her most recent lipid panel from 10/30/22 shows uncontrolled LDL of 131 (improving) and elevated triglycerides of 181- improving.  She is advised to restart Lipitor 40 mg po daily at bedtime and Fish oil supplement- refill sent in today.  Side effects and precautions discussed with her.  3) HTN Her blood pressure is controlled to target.  Will defer any med changes to nephrology or PCP.  4) Hypothyroidism: There are no recent TFTs to review.  She is advised to continue Levothyroxine  50 mcg po daily before breakfast.  Will recheck TFTs prior to next visit and adjust dose accordingly.   - We discussed about the correct intake of her thyroid  hormone, on empty stomach at fasting, with water , separated by at least 30 minutes from breakfast and other medications,  and separated by more than 4 hours from calcium , iron, multivitamins, acid reflux medications (PPIs). -Patient is made aware of the fact that thyroid  hormone replacement is needed for life, dose to be adjusted by periodic monitoring of thyroid  function tests.  5) Chronic heavy smoking: The patient was counseled on the dangers of tobacco use, and was advised to quit.  Reviewed strategies to maximize success, including removing cigarettes and smoking materials from environment.  6) Weight management:  Her Body mass index is 25.63 kg/m.  She is not a candidate for major weight loss.  Exercise and carbs regimen discussed in detail with her.   - I advised patient to maintain close follow up with Collins Dean, NP for primary care needs.     I spent  27  minutes in the care of the patient today including review of labs from CMP, Lipids, Thyroid  Function, Hematology  (current and previous including abstractions from other facilities); face-to-face time discussing  her blood glucose readings/logs, discussing hypoglycemia and hyperglycemia episodes and symptoms, medications doses, her options of short and long term treatment based on the latest standards of care / guidelines;  discussion about incorporating lifestyle medicine;  and documenting the encounter. Risk reduction counseling performed per USPSTF guidelines to reduce obesity and cardiovascular risk factors.     Please refer to Patient Instructions for Blood Glucose Monitoring and Insulin /Medications Dosing Guide"  in media tab for additional information. Please  also refer to " Patient Self Inventory" in the Media  tab for reviewed elements of pertinent patient history.  Sydney Morrow participated in the discussions, expressed understanding, and voiced agreement with the above plans.  All questions were answered to her satisfaction. she is encouraged to contact clinic should she have any questions or concerns prior to her return visit.   Follow up plan: - Return in about 6 months (around 07/12/2024) for Diabetes F/U with A1c in office, Thyroid  follow up, Previsit labs.  Hulon Magic, [facility] [address] [place], [place] [address] Phone: [phone] Fax: [phone]  01/10/2024, 9:33 AM

## 2024-01-10 NOTE — Patient Instructions (Signed)

## 2024-01-15 ENCOUNTER — Ambulatory Visit

## 2024-01-23 ENCOUNTER — Other Ambulatory Visit: Payer: Self-pay | Admitting: Nurse Practitioner

## 2024-01-23 DIAGNOSIS — Z419 Encounter for procedure for purposes other than remedying health state, unspecified: Secondary | ICD-10-CM | POA: Diagnosis not present

## 2024-01-23 DIAGNOSIS — I10 Essential (primary) hypertension: Secondary | ICD-10-CM

## 2024-01-24 ENCOUNTER — Other Ambulatory Visit: Payer: Self-pay | Admitting: Nurse Practitioner

## 2024-01-24 DIAGNOSIS — K219 Gastro-esophageal reflux disease without esophagitis: Secondary | ICD-10-CM

## 2024-01-26 ENCOUNTER — Other Ambulatory Visit: Payer: Self-pay | Admitting: Internal Medicine

## 2024-01-26 DIAGNOSIS — J3089 Other allergic rhinitis: Secondary | ICD-10-CM

## 2024-01-28 ENCOUNTER — Telehealth: Payer: Self-pay | Admitting: Nurse Practitioner

## 2024-01-28 NOTE — Telephone Encounter (Signed)
 Routing to PCP for review.

## 2024-01-28 NOTE — Telephone Encounter (Unsigned)
 Copied from CRM [id number]. Topic: Clinical - Medication Refill >> Jan 28, 2024  1:40 PM Rachelle R wrote: Medication: loratadine  (CLARITIN ) 10 MG tablet  Has the patient contacted their pharmacy? Yes, call dr  This is the patient's preferred pharmacy:  [facility] [address], [place] - [address] [place] [address] [place] [address] [place] [address] Phone: [phone] Fax: [phone]  Is this the correct pharmacy for this prescription? Yes If no, delete pharmacy and type the correct one.   Has the prescription been filled recently? No  Is the patient out of the medication? Yes  Has the patient been seen for an appointment in the last year OR does the patient have an upcoming appointment? Yes  Can we respond through MyChart? No  Agent: Please be advised that Rx refills may take up to 3 business days. We ask that you follow-up with your pharmacy.

## 2024-01-28 NOTE — Telephone Encounter (Signed)
 No triage- Pt called and requested refill on Claritin . Not apart of medication list. Called to triage. Left message. Please advise.

## 2024-01-29 NOTE — Telephone Encounter (Signed)
 Let patient know that Claritin  is not on her current med list.  If she is having issues with allergies and would like a pill to use for it, Allegra would be safer to use based on her kidney function.  Let me know if she would like for me to send the prescription to her pharmacy for Allegra.

## 2024-01-30 MED ORDER — FEXOFENADINE HCL 60 MG PO TABS: 60.0000 mg | ORAL_TABLET | Freq: Two times a day (BID) | ORAL | 1 refills | Status: AC | PRN

## 2024-02-07 ENCOUNTER — Ambulatory Visit: Payer: 59 | Attending: Nurse Practitioner | Admitting: Nurse Practitioner

## 2024-02-07 ENCOUNTER — Encounter: Payer: Self-pay | Admitting: Nurse Practitioner

## 2024-02-07 VITALS — BP 164/69 | HR 55 | Resp 19 | Ht 66.0 in | Wt 156.6 lb

## 2024-02-07 DIAGNOSIS — E7800 Pure hypercholesterolemia, unspecified: Secondary | ICD-10-CM

## 2024-02-07 DIAGNOSIS — E790 Hyperuricemia without signs of inflammatory arthritis and tophaceous disease: Secondary | ICD-10-CM | POA: Diagnosis not present

## 2024-02-07 DIAGNOSIS — F419 Anxiety disorder, unspecified: Secondary | ICD-10-CM | POA: Diagnosis not present

## 2024-02-07 DIAGNOSIS — F32A Depression, unspecified: Secondary | ICD-10-CM

## 2024-02-07 DIAGNOSIS — I10 Essential (primary) hypertension: Secondary | ICD-10-CM

## 2024-02-07 MED ORDER — AMLODIPINE BESYLATE 10 MG PO TABS: 10.0000 mg | ORAL_TABLET | Freq: Every day | ORAL | 1 refills | Status: AC

## 2024-02-07 NOTE — Progress Notes (Addendum)
 Assessment & Plan:  Sydney Morrow was seen today for hypertension.  Diagnoses and all orders for this visit:  Primary hypertension -     amLODipine  (NORVASC ) 10 MG tablet; Take 1 tablet (10 mg total) by mouth daily. Will increase hydralazine  to 100 mg BID if blood pressure continues elevated.   Elevated uric acid in blood -     Uric Acid  Hypercholesterolemia -     Lipid panel INSTRUCTIONS: Work on a low fat, heart healthy diet and participate in regular aerobic exercise program by working out at least 150 minutes per week; 5 days a week-30 minutes per day. Avoid red meat/beef/steak,  fried foods. junk foods, sodas, sugary drinks, unhealthy snacking, alcohol  and smoking.  Drink at least 80 oz of water  per day and monitor your carbohydrate intake daily.    Anxiety and depression -     Ambulatory referral to Psychiatry Declines SSRI for now    Patient has been counseled on age-appropriate routine health concerns for screening and prevention. These are reviewed and up-to-date. Referrals have been placed accordingly. Immunizations are up-to-date or declined.    Subjective:   Chief Complaint  Patient presents with   Hypertension    Sydney Morrow 63 y.o. female presents to office today for follow up to hypertension.  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    Blood pressure is elevated today.  She endorses adherence to taking her amlodipine , metoprolol  50 mg daily, hydralazine  and Hyzaar.  She continues to smoke cigarettes daily. BP Readings from Last 3 Encounters:  02/07/24 (!) 164/69  01/10/24 138/88  10/09/23 (!) 160/60    Lab Results  Component Value Date   TSH 1.00 06/14/2023     States her grandson and daughter were recently diagnosed with cancer as she is also taking care of ailing mother which has caused increased stress.      02/07/2024    9:45  AM 05/23/2023    5:31 PM 10/30/2022   11:33 AM  Depression screen PHQ 2/9  Decreased Interest 1 0 0  Down, Depressed, Hopeless 2 1 0  PHQ - 2 Score 3 1 0  Altered sleeping 3 1 2   Tired, decreased energy 2 1 1   Change in appetite 2 0 0  Feeling bad or failure about yourself  3 1 1   Trouble concentrating 2 1 0  Moving slowly or fidgety/restless 1 0 0  Suicidal thoughts 1 0 0  PHQ-9 Score 17 5 4   Difficult doing work/chores Somewhat difficult Not difficult at all        02/07/2024    9:45 AM 05/23/2023    5:32 PM 10/30/2022   11:33 AM 02/05/2022   10:37 AM  GAD 7 : Generalized Anxiety Score  Nervous, Anxious, on Edge 2 1 1 2   Control/stop worrying 3 2 1 3   Worry too much - different things 2 2 1 3   Trouble relaxing 0 1 0 2  Restless 1 0 0 0  Easily annoyed or irritable 3 1 1 2   Afraid - awful might happen 3 0 0 2  Total GAD 7 Score 14 7 4 14   Anxiety Difficulty Somewhat difficult Somewhat difficult       Review of Systems  Constitutional:  Negative for fever, malaise/fatigue and weight loss.  HENT: Negative.  Negative for nosebleeds.   Eyes: Negative.  Negative for blurred vision, double vision and  photophobia.  Respiratory: Negative.  Negative for cough and shortness of breath.   Cardiovascular: Negative.  Negative for chest pain, palpitations and leg swelling.  Gastrointestinal: Negative.  Negative for heartburn, nausea and vomiting.  Musculoskeletal: Negative.  Negative for myalgias.  Neurological: Negative.  Negative for dizziness, focal weakness, seizures and headaches.  Psychiatric/Behavioral:  Positive for depression. Negative for suicidal ideas. The patient is nervous/anxious.     Past Medical History:  Diagnosis Date   Acid reflux    Diabetes (HCC)    History of anemia    History of hiatal hernia    Hypercholesterolemia    Hypertension    Jaw inflammation, right 01/30/2017    Past Surgical History:  Procedure Laterality Date   COLONOSCOPY N/A 03/05/2018    Procedure: COLONOSCOPY;  Surgeon: Harvey Margo CROME, MD;  [facility];  Service: Endoscopy;  Laterality: N/A;  12:00pm   COLONOSCOPY N/A 03/06/2018   Procedure: COLONOSCOPY;  Surgeon: Harvey Margo CROME, MD;  [facility];  Service: Endoscopy;  Laterality: N/A;   DENTAL SURGERY     ESOPHAGOGASTRODUODENOSCOPY  2011   white, raised, somewhat fixed plaques esophageal mucosa concerning for possible Candida esophagitis s/p brushing. Multiple gastric antral ulcerations, multiple small bulbar erosions and ulcerations.    ESOPHAGOGASTRODUODENOSCOPY N/A 03/05/2018   Procedure: ESOPHAGOGASTRODUODENOSCOPY (EGD);  Surgeon: Harvey Margo CROME, MD;  [facility];  Service: Endoscopy;  Laterality: N/A;   ESOPHAGOGASTRODUODENOSCOPY N/A 03/06/2018   Procedure: ESOPHAGOGASTRODUODENOSCOPY (EGD);  Surgeon: Harvey Margo CROME, MD;  [facility];  Service: Endoscopy;  Laterality: N/A;   TOOTH EXTRACTION N/A 02/01/2017   Procedure: FULL DENTAL EXTRACTIONS;  Surgeon: Sheryle Hamilton, DDS;  Location: [facility] OR;  Service: Oral Surgery;  Laterality: N/A;   TUBAL LIGATION      Family History  Problem Relation Age of Onset   Breast cancer Mother    Bronchitis Daughter    Asthma Grandson    Asthma Grandson    Asthma Grandson    Asthma Granddaughter    Colon cancer Neg Hx    Colon polyps Neg Hx     Social History Reviewed with no changes to be made today.   Outpatient Medications Prior to Visit  Medication Sig Dispense Refill   Accu-Chek Softclix Lancets lancets Patient is to check her blood sugar daily before breakfast 100 each 3   atorvastatin  (LIPITOR) 40 MG tablet Take 1 tablet (40 mg total) by mouth daily. 90 tablet 3   azelastine  (ASTELIN ) 0.1 % nasal spray Place 1 spray into both nostrils 2 (two) times daily as needed for rhinitis. Use in each nostril as directed 30 mL 11   Blood Glucose Monitoring Suppl (ACCU-CHEK GUIDE ME) w/Device KIT Use to check glucose twice daily 1 kit 0    Cholecalciferol (VITAMIN D ) 125 MCG (5000 UT) CAPS Take 5,000 Int'l Units/kg by mouth daily.     Coenzyme Q10 (CO Q 10 PO) Take by mouth daily.     cyclobenzaprine  (FLEXERIL ) 5 MG tablet Take 1 tablet (5 mg total) by mouth 3 (three) times daily as needed for muscle spasms. 30 tablet 0   fexofenadine  (ALLEGRA  ALLERGY ) 60 MG tablet Take 1 tablet (60 mg total) by mouth 2 (two) times daily as needed for allergies or rhinitis. 60 tablet 1   fluticasone  (FLONASE ) 50 MCG/ACT nasal spray Use 1 spray(s) in each nostril once daily 16 g 0   glucose blood (ACCU-CHEK GUIDE) test strip Patient is to check blood sugar daily  before breakfast 100 each 3   hydrALAZINE  (APRESOLINE ) 50 MG tablet Take 1 tablet by mouth twice daily 90 tablet 0   JARDIANCE  10 MG TABS tablet Take 1 tablet (10 mg total) by mouth daily. 90 tablet 3   levothyroxine  (SYNTHROID ) 50 MCG tablet TAKE 1 TABLET BY MOUTH ONCE DAILY BEFORE BREAKFAST 90 tablet 3   losartan -hydrochlorothiazide  (HYZAAR) 100-25 MG tablet Take 0.5 tablets by mouth daily. 45 tablet 1   metoprolol  tartrate (LOPRESSOR ) 50 MG tablet Take 1 tablet (50 mg total) by mouth 2 (two) times daily. 180 tablet 1   Omega-3 Fatty Acids (FISH OIL PO) Take by mouth 2 (two) times a day.     pantoprazole  (PROTONIX ) 40 MG tablet Take 1 tablet by mouth once daily 30 tablet 0   sodium bicarbonate  650 MG tablet Take 650 mg by mouth 2 (two) times daily.     TRUEplus Lancets 28G MISC Use as directed. 100 each 11   vitamin B-12 (CYANOCOBALAMIN) 500 MCG tablet Take 500 mcg by mouth daily.     amLODipine  (NORVASC ) 10 MG tablet Take 1 tablet (10 mg total) by mouth daily. 90 tablet 1   No facility-administered medications prior to visit.    Allergies  Allergen Reactions   Aspirin Other (See Comments)    Mouth, upper lip, nose area becomes numb   Morphine  And Codeine Other (See Comments)    Did not feel good   Other     Cats   Peach Flavoring Agent (Non-Screening)     Mouth and body  itches.    Penicillins Other (See Comments)    Unknown Has patient had a PCN reaction causing immediate rash, facial/tongue/throat swelling, SOB or lightheadedness with hypotension: Unknown Has patient had a PCN reaction causing severe rash involving mucus membranes or skin necrosis: Unknown Has patient had a PCN reaction that required hospitalization: No Has patient had a PCN reaction occurring within the last 10 years: No If all of the above answers are NO, then may proceed with Cephalosporin use.        Objective:    BP (!) 164/69 (BP Location: Right Arm, Patient Position: Sitting, Cuff Size: Normal)   Pulse (!) 55   Resp (!) 55   Ht 5' 6 (1.676 m)   Wt 156 lb 9.6 oz (71 kg)   LMP 08/13/2014 (Approximate)   SpO2 98%   BMI 25.28 kg/m  Wt Readings from Last 3 Encounters:  02/07/24 156 lb 9.6 oz (71 kg)  01/10/24 158 lb 12.8 oz (72 kg)  10/09/23 163 lb (73.9 kg)    Physical Exam Vitals and nursing note reviewed.  Constitutional:      Appearance: She is well-developed.  HENT:     Head: Normocephalic and atraumatic.   Cardiovascular:     Rate and Rhythm: Regular rhythm. Bradycardia present.     Heart sounds: Normal heart sounds. No murmur heard.    No friction rub. No gallop.  Pulmonary:     Effort: Pulmonary effort is normal. No tachypnea or respiratory distress.     Breath sounds: Normal breath sounds. No decreased breath sounds, wheezing, rhonchi or rales.  Chest:     Chest wall: No tenderness.  Abdominal:     General: Bowel sounds are normal.     Palpations: Abdomen is soft.   Musculoskeletal:        General: Normal range of motion.     Cervical back: Normal range of motion.   Skin:  General: Skin is warm and dry.   Neurological:     Mental Status: She is alert and oriented to person, place, and time.     Coordination: Coordination normal.   Psychiatric:        Behavior: Behavior normal. Behavior is cooperative.        Thought Content: Thought  content normal.        Judgment: Judgment normal.         Patient has been counseled extensively about nutrition and exercise as well as the importance of adherence with medications and regular follow-up. The patient was given clear instructions to go to ER or return to medical center if symptoms don't improve, worsen or new problems develop. The patient verbalized understanding.   Follow-up: Return in about 3 months (around 05/18/2024).   Haze LELON Servant, FNP-BC [facility] [place], [place] [phone]   02/07/2024, 12:27 PM

## 2024-02-08 LAB — URIC ACID: Uric Acid: 7 mg/dL (ref 3.0–7.2)

## 2024-02-08 LAB — LIPID PANEL
Chol/HDL Ratio: 4.2 ratio (ref 0.0–4.4)
Cholesterol, Total: 207 mg/dL — ABNORMAL HIGH (ref 100–199)
HDL: 49 mg/dL (ref >39)
LDL Chol Calc (NIH): 116 mg/dL — ABNORMAL HIGH (ref 0–99)
Triglycerides: 242 mg/dL — ABNORMAL HIGH (ref 0–149)
VLDL Cholesterol Cal: 42 mg/dL — ABNORMAL HIGH (ref 5–40)

## 2024-02-15 ENCOUNTER — Ambulatory Visit: Payer: Self-pay | Admitting: Nurse Practitioner

## 2024-02-18 ENCOUNTER — Ambulatory Visit

## 2024-02-19 ENCOUNTER — Other Ambulatory Visit: Payer: Self-pay | Admitting: Nurse Practitioner

## 2024-02-19 DIAGNOSIS — M5441 Lumbago with sciatica, right side: Secondary | ICD-10-CM

## 2024-02-19 NOTE — Telephone Encounter (Signed)
 Please schedule her a virtual visit to discuss. Thanks

## 2024-02-19 NOTE — Telephone Encounter (Signed)
 Copied from CRM [id number]. Topic: Clinical - Medication Refill >> Feb 19, 2024 11:17 AM Wess RAMAN wrote: Medication: cyclobenzaprine  (FLEXERIL ) 5 MG tablet   Has the patient contacted their pharmacy? No (Agent: If no, request that the patient contact the pharmacy for the refill. If patient does not wish to contact the pharmacy document the reason why and proceed with request.) (Agent: If yes, when and what did the pharmacy advise?)  This is the patient's preferred pharmacy:  [facility] [address], [place] - [address] [place] [address] Phone: [phone] Fax: [phone]  Is this the correct pharmacy for this prescription? Yes If no, delete pharmacy and type the correct one.   Has the prescription been filled recently? Yes  Is the patient out of the medication? Yes  Has the patient been seen for an appointment in the last year OR does the patient have an upcoming appointment? Yes  Can we respond through MyChart? No  Agent: Please be advised that Rx refills may take up to 3 business days. We ask that you follow-up with your pharmacy.

## 2024-02-20 DIAGNOSIS — E8722 Chronic metabolic acidosis: Secondary | ICD-10-CM | POA: Diagnosis not present

## 2024-02-20 DIAGNOSIS — N184 Chronic kidney disease, stage 4 (severe): Secondary | ICD-10-CM | POA: Diagnosis not present

## 2024-02-20 DIAGNOSIS — I10 Essential (primary) hypertension: Secondary | ICD-10-CM | POA: Diagnosis not present

## 2024-02-20 DIAGNOSIS — E1122 Type 2 diabetes mellitus with diabetic chronic kidney disease: Secondary | ICD-10-CM | POA: Diagnosis not present

## 2024-02-20 DIAGNOSIS — N189 Chronic kidney disease, unspecified: Secondary | ICD-10-CM | POA: Diagnosis not present

## 2024-02-20 NOTE — Telephone Encounter (Signed)
 Unable to reach patient by phone to relay results.  Voicemail left to return call to schedule.

## 2024-02-21 MED ORDER — CYCLOBENZAPRINE HCL 5 MG PO TABS: 5.0000 mg | ORAL_TABLET | Freq: Three times a day (TID) | ORAL | 0 refills | Status: DC | PRN

## 2024-02-21 NOTE — Telephone Encounter (Signed)
 Patient virtual appointment scheduled for 02/24/2024 at 1:30. Patient acknowledged appointment.

## 2024-02-21 NOTE — Telephone Encounter (Signed)
 Requested medication (s) are due for refill today: yes  Requested medication (s) are on the active medication list: yes  Last refill:  05/23/23 #30/0  Future visit scheduled: yes  Notes to clinic:  Unable to refill per protocol, cannot delegate.      Requested Prescriptions  Pending Prescriptions Disp Refills   cyclobenzaprine  (FLEXERIL ) 5 MG tablet 30 tablet 0    Sig: Take 1 tablet (5 mg total) by mouth 3 (three) times daily as needed for muscle spasms.     Not Delegated - Analgesics:  Muscle Relaxants Failed - 02/21/2024 11:39 AM      Failed - This refill cannot be delegated      Passed - Valid encounter within last 6 months    Recent Outpatient Visits           2 weeks ago Primary hypertension   [facility] [place]. [facility] [place], [place] W, NP   4 months ago Type 2 diabetes mellitus with stage 3a chronic kidney disease, without long-term current use of insulin  [facility])   [facility] [place]. [facility] [place], [place], [place]   1 year ago Type 2 diabetes mellitus with stage 3a chronic kidney disease, without long-term current use of insulin  [facility])   [facility] [place]. [facility] Theotis Haze ORN, NP   2 years ago Essential hypertension   [facility] [place]. [facility] Theotis Haze ORN, NP   2 years ago Encounter for Papanicolaou smear for cervical cancer screening   [facility] [place]. [facility] Theotis Haze ORN, NP       Future Appointments             In 2 months Theotis Haze ORN, NP [facility] [place]. [facility]

## 2024-02-22 DIAGNOSIS — Z419 Encounter for procedure for purposes other than remedying health state, unspecified: Secondary | ICD-10-CM | POA: Diagnosis not present

## 2024-02-24 ENCOUNTER — Ambulatory Visit: Payer: Self-pay | Attending: Nurse Practitioner | Admitting: Nurse Practitioner

## 2024-02-24 ENCOUNTER — Encounter: Payer: Self-pay | Admitting: Nurse Practitioner

## 2024-02-24 DIAGNOSIS — E790 Hyperuricemia without signs of inflammatory arthritis and tophaceous disease: Secondary | ICD-10-CM

## 2024-02-24 NOTE — Progress Notes (Signed)
 Called patient. She states she has no questions

## 2024-03-24 DIAGNOSIS — Z419 Encounter for procedure for purposes other than remedying health state, unspecified: Secondary | ICD-10-CM | POA: Diagnosis not present

## 2024-03-31 ENCOUNTER — Other Ambulatory Visit: Payer: Self-pay | Admitting: Nurse Practitioner

## 2024-03-31 DIAGNOSIS — K219 Gastro-esophageal reflux disease without esophagitis: Secondary | ICD-10-CM

## 2024-04-15 ENCOUNTER — Encounter

## 2024-04-24 DIAGNOSIS — Z419 Encounter for procedure for purposes other than remedying health state, unspecified: Secondary | ICD-10-CM | POA: Diagnosis not present

## 2024-05-02 ENCOUNTER — Encounter

## 2024-05-18 ENCOUNTER — Ambulatory Visit: Admitting: Nurse Practitioner

## 2024-05-19 ENCOUNTER — Ambulatory Visit
Admission: RE | Admit: 2024-05-19 | Discharge: 2024-05-19 | Disposition: A | Discharge status: Home / Self Care | Source: Ambulatory Visit | Attending: Nurse Practitioner | Admitting: Nurse Practitioner

## 2024-05-19 ENCOUNTER — Encounter

## 2024-05-19 DIAGNOSIS — Z1231 Encounter for screening mammogram for malignant neoplasm of breast: Secondary | ICD-10-CM

## 2024-05-25 ENCOUNTER — Other Ambulatory Visit: Payer: Self-pay | Admitting: Nurse Practitioner

## 2024-05-25 DIAGNOSIS — K219 Gastro-esophageal reflux disease without esophagitis: Secondary | ICD-10-CM

## 2024-05-27 ENCOUNTER — Ambulatory Visit: Payer: Self-pay | Admitting: Nurse Practitioner

## 2024-05-29 LAB — MICROALBUMIN / CREATININE URINE RATIO: Microalb Creat Ratio: 2425

## 2024-05-29 LAB — HEMOGLOBIN A1C: Hemoglobin A1C: 6

## 2024-06-02 ENCOUNTER — Ambulatory Visit: Admitting: Nurse Practitioner

## 2024-07-13 ENCOUNTER — Other Ambulatory Visit: Payer: Self-pay | Admitting: Nurse Practitioner

## 2024-07-13 ENCOUNTER — Encounter: Admitting: Nurse Practitioner

## 2024-07-13 DIAGNOSIS — E039 Hypothyroidism, unspecified: Secondary | ICD-10-CM

## 2024-07-13 DIAGNOSIS — Z7984 Long term (current) use of oral hypoglycemic drugs: Secondary | ICD-10-CM

## 2024-07-13 DIAGNOSIS — E782 Mixed hyperlipidemia: Secondary | ICD-10-CM

## 2024-07-13 DIAGNOSIS — I10 Essential (primary) hypertension: Secondary | ICD-10-CM

## 2024-07-13 DIAGNOSIS — E1122 Type 2 diabetes mellitus with diabetic chronic kidney disease: Secondary | ICD-10-CM

## 2024-07-14 ENCOUNTER — Ambulatory Visit: Admitting: Nurse Practitioner

## 2024-07-14 ENCOUNTER — Other Ambulatory Visit: Payer: Self-pay | Admitting: Nurse Practitioner

## 2024-07-14 DIAGNOSIS — K219 Gastro-esophageal reflux disease without esophagitis: Secondary | ICD-10-CM

## 2024-07-14 NOTE — Progress Notes (Signed)
 Erroneous encounter

## 2024-07-15 ENCOUNTER — Ambulatory Visit: Admitting: Nurse Practitioner

## 2024-07-15 ENCOUNTER — Encounter: Payer: Self-pay | Admitting: Nurse Practitioner

## 2024-07-15 VITALS — BP 130/60 | HR 62 | Ht 66.0 in | Wt 158.8 lb

## 2024-07-15 DIAGNOSIS — E782 Mixed hyperlipidemia: Secondary | ICD-10-CM

## 2024-07-15 DIAGNOSIS — N1832 Chronic kidney disease, stage 3b: Secondary | ICD-10-CM

## 2024-07-15 DIAGNOSIS — E1122 Type 2 diabetes mellitus with diabetic chronic kidney disease: Secondary | ICD-10-CM

## 2024-07-15 DIAGNOSIS — I10 Essential (primary) hypertension: Secondary | ICD-10-CM | POA: Diagnosis not present

## 2024-07-15 DIAGNOSIS — E039 Hypothyroidism, unspecified: Secondary | ICD-10-CM

## 2024-07-15 DIAGNOSIS — Z7984 Long term (current) use of oral hypoglycemic drugs: Secondary | ICD-10-CM

## 2024-07-15 LAB — T4, FREE: Free T4: 1 ng/dL (ref 0.8–1.8)

## 2024-07-15 LAB — TSH: TSH: 1.65 m[IU]/L (ref 0.40–4.50)

## 2024-07-15 MED ORDER — JARDIANCE 10 MG PO TABS: 10.0000 mg | ORAL_TABLET | Freq: Every day | ORAL | 3 refills | Status: AC

## 2024-07-15 MED ORDER — LEVOTHYROXINE SODIUM 50 MCG PO TABS: ORAL_TABLET | ORAL | 3 refills | Status: AC

## 2024-07-15 NOTE — Progress Notes (Signed)
 07/15/2024, 2:35 PM  Endocrinology follow-up note         Subjective:    Patient ID: Sydney Morrow, female    DOB: Oct 04, 1960.  Sydney Morrow is engaged in follow-up for the management of  her currently controlled type 2 diabetes, hypothyroidism, hyperlipidemia.    PCP: Theotis Haze ORN, NP   Past Medical History:  Diagnosis Date   Acid reflux    Diabetes (HCC)    History of anemia    History of hiatal hernia    Hypercholesterolemia    Hypertension    Jaw inflammation, right 01/30/2017   Past Surgical History:  Procedure Laterality Date   COLONOSCOPY N/A 03/05/2018   Procedure: COLONOSCOPY;  Surgeon: Harvey Margo CROME, MD;  Location: [facility];  Service: Endoscopy;  Laterality: N/A;  12:00pm   COLONOSCOPY N/A 03/06/2018   Procedure: COLONOSCOPY;  Surgeon: Harvey Margo CROME, MD;  Location: [facility];  Service: Endoscopy;  Laterality: N/A;   DENTAL SURGERY     ESOPHAGOGASTRODUODENOSCOPY  2011   white, raised, somewhat fixed plaques esophageal mucosa concerning for possible Candida esophagitis s/p brushing. Multiple gastric antral ulcerations, multiple small bulbar erosions and ulcerations.    ESOPHAGOGASTRODUODENOSCOPY N/A 03/05/2018   Procedure: ESOPHAGOGASTRODUODENOSCOPY (EGD);  Surgeon: Harvey Margo CROME, MD;  Location: [facility];  Service: Endoscopy;  Laterality: N/A;   ESOPHAGOGASTRODUODENOSCOPY N/A 03/06/2018   Procedure: ESOPHAGOGASTRODUODENOSCOPY (EGD);  Surgeon: Harvey Margo CROME, MD;  Location: [facility];  Service: Endoscopy;  Laterality: N/A;   TOOTH EXTRACTION N/A 02/01/2017   Procedure: FULL DENTAL EXTRACTIONS;  Surgeon: Sheryle Hamilton, DDS;  Location: [facility];  Service: Oral Surgery;  Laterality: N/A;   TUBAL LIGATION     Social History   Socioeconomic History   Marital status: Single    Spouse name: Not on file   Number of children: Not on file   Years of education: Not on  file   Highest education level: Not on file  Occupational History   Not on file  Tobacco Use   Smoking status: Every Day    Current packs/day: 0.50    Average packs/day: 0.5 packs/day for 34.9 years (17.5 ttl pk-yrs)    Types: Cigarettes    Start date: 44   Smokeless tobacco: Never  Vaping Use   Vaping status: Never Used  Substance and Sexual Activity   Alcohol  use: No   Drug use: No   Sexual activity: Not Currently  Other Topics Concern   Not on file  Social History Narrative   Not on file   Social Drivers of Health   Financial Resource Strain: Medium Risk (02/07/2024)   Overall Financial Resource Strain (CARDIA)    Difficulty of Paying Living Expenses: Somewhat hard  Food Insecurity: Food Insecurity Present (02/07/2024)   Hunger Vital Sign    Worried About Running Out of Food in the Last Year: Sometimes true    Ran Out of Food in the Last Year: Sometimes true  Transportation Needs: No Transportation Needs (02/07/2024)   PRAPARE - Administrator, Civil Service (Medical): No    Lack of Transportation (Non-Medical): No  Physical Activity: Inactive (02/07/2024)   Exercise Vital Sign    Days of Exercise per Week: 0 days    Minutes of Exercise per Session: 0 min  Stress: Stress Concern Present (02/07/2024)   [employer] of Occupational Health - Occupational Stress Questionnaire    Feeling of Stress: To some extent  Social Connections: Moderately Isolated (02/07/2024)   Social Connection and Isolation Panel    Frequency of Communication with Friends and Family: Twice a week    Frequency of Social Gatherings with Friends and Family: Once a week    Attends Religious Services: Patient declined    Database Administrator or Organizations: Yes    Attends Banker Meetings: Patient declined    Marital Status: Never married   Outpatient Encounter Medications as of 07/15/2024  Medication Sig   Accu-Chek Softclix Lancets lancets Patient is to check her  blood sugar daily before breakfast   amLODipine  (NORVASC ) 10 MG tablet Take 1 tablet (10 mg total) by mouth daily.   atorvastatin  (LIPITOR) 40 MG tablet Take 1 tablet (40 mg total) by mouth daily.   azelastine  (ASTELIN ) 0.1 % nasal spray Place 1 spray into both nostrils 2 (two) times daily as needed for rhinitis. Use in each nostril as directed   Blood Glucose Monitoring Suppl (ACCU-CHEK GUIDE ME) w/Device KIT Use to check glucose twice daily   Cholecalciferol (VITAMIN D ) 125 MCG (5000 UT) CAPS Take 5,000 Int'l Units/kg by mouth daily.   Coenzyme Q10 (CO Q 10 PO) Take by mouth daily.   cyclobenzaprine  (FLEXERIL ) 5 MG tablet Take 1 tablet (5 mg total) by mouth 3 (three) times daily as needed for muscle spasms.   fexofenadine  (ALLEGRA  ALLERGY ) 60 MG tablet Take 1 tablet (60 mg total) by mouth 2 (two) times daily as needed for allergies or rhinitis.   fluticasone  (FLONASE ) 50 MCG/ACT nasal spray Use 1 spray(s) in each nostril once daily   glucose blood (ACCU-CHEK GUIDE) test strip Patient is to check blood sugar daily before breakfast   hydrALAZINE  (APRESOLINE ) 50 MG tablet Take 1 tablet by mouth twice daily   LOKELMA 10 g PACK packet Take 10 g by mouth. Patient takes 1 pack 3 times a week   losartan -hydrochlorothiazide  (HYZAAR) 100-25 MG tablet Take 0.5 tablets by mouth daily.   metoprolol  tartrate (LOPRESSOR ) 50 MG tablet Take 1 tablet (50 mg total) by mouth 2 (two) times daily.   Omega-3 Fatty Acids (FISH OIL PO) Take by mouth 2 (two) times a day.   pantoprazole  (PROTONIX ) 40 MG tablet Take 1 tablet by mouth once daily   sodium bicarbonate  650 MG tablet Take 650 mg by mouth 2 (two) times daily.   TRUEplus Lancets 28G MISC Use as directed.   vitamin B-12 (CYANOCOBALAMIN) 500 MCG tablet Take 500 mcg by mouth daily.   [DISCONTINUED] JARDIANCE  10 MG TABS tablet Take 1 tablet (10 mg total) by mouth daily.   [DISCONTINUED] levothyroxine  (SYNTHROID ) 50 MCG tablet TAKE 1 TABLET BY MOUTH ONCE DAILY  BEFORE BREAKFAST   JARDIANCE  10 MG TABS tablet Take 1 tablet (10 mg total) by mouth daily.   levothyroxine  (SYNTHROID ) 50 MCG tablet TAKE 1 TABLET BY MOUTH ONCE DAILY BEFORE BREAKFAST   [DISCONTINUED] pantoprazole  (PROTONIX ) 40 MG tablet Take 1 tablet by mouth once daily   No facility-administered encounter medications on file as of 07/15/2024.    ALLERGIES: Allergies  Allergen Reactions   Aspirin Other (See Comments)    Mouth, upper lip, nose area becomes numb  Morphine  And Codeine Other (See Comments)    Did not feel good   Other     Cats   Peach Flavoring Agent (Non-Screening)     Mouth and body itches.    Penicillins Other (See Comments)    Unknown Has patient had a PCN reaction causing immediate rash, facial/tongue/throat swelling, SOB or lightheadedness with hypotension: Unknown Has patient had a PCN reaction causing severe rash involving mucus membranes or skin necrosis: Unknown Has patient had a PCN reaction that required hospitalization: No Has patient had a PCN reaction occurring within the last 10 years: No If all of the above answers are NO, then may proceed with Cephalosporin use.     VACCINATION STATUS: Immunization History  Administered Date(s) Administered   Influenza Whole 05/15/2019   Influenza,inj,Quad PF,6+ Mos 09/10/2018   Pneumococcal Polysaccharide-23 09/10/2018   Tdap 09/10/2018   Zoster Recombinant(Shingrix ) 02/05/2022    Diabetes She presents for her follow-up diabetic visit. She has type 2 diabetes mellitus. Onset time: She reports that she was diagnosed at approximate age of 57 years. Her disease course has been stable. There are no hypoglycemic associated symptoms. Pertinent negatives for hypoglycemia include no confusion, pallor or seizures. Associated symptoms include polydipsia and polyuria. Pertinent negatives for diabetes include no fatigue and no polyphagia. There are no hypoglycemic complications. Symptoms are stable. Diabetic  complications include nephropathy. Risk factors for coronary artery disease include diabetes mellitus, dyslipidemia, family history, hypertension, sedentary lifestyle, tobacco exposure and post-menopausal. Current diabetic treatment includes oral agent (monotherapy) (Jardiance  by nephrology). She is compliant with treatment most of the time. Her weight is fluctuating minimally. She is following a generally unhealthy diet. When asked about meal planning, she reported none. She has not had a previous visit with a dietitian. She rarely participates in exercise. (She presents today with no meter, no logs, was not asked to routinely monitor glucose due to safe medication regimen.  Her previsit A1c on 10/17 was 6%, essentially unchanged from prior visit of 6.1%.  She denies any hypoglycemia.  She is under great amount of stress due to family health issues.) An ACE inhibitor/angiotensin II receptor blocker is being taken. She does not see a podiatrist.Eye exam is current.     Review of systems  Constitutional: + stable body weight,  current Body mass index is 25.63 kg/m. , + fatigue, no subjective hyperthermia, no subjective hypothermia Eyes: no blurry vision, no xerophthalmia ENT: no sore throat, no nodules palpated in throat, no dysphagia/odynophagia, no hoarseness Cardiovascular: no chest pain, no shortness of breath, no palpitations, no leg swelling Respiratory: no cough, no shortness of breath Gastrointestinal: no nausea/vomiting/diarrhea Musculoskeletal: no muscle/joint aches Skin: no rashes, no hyperemia Neurological: no tremors, no numbness, no tingling, no dizziness Psychiatric: no depression, no anxiety, + increased stress   Objective:    BP 130/60 (BP Location: Left Arm, Patient Position: Sitting, Cuff Size: Large)   Pulse 62   Ht 5' 6 (1.676 m)   Wt 158 lb 12.8 oz (72 kg)   LMP 08/13/2014 (Approximate)   BMI 25.63 kg/m   Wt Readings from Last 3 Encounters:  07/15/24 158 lb 12.8 oz  (72 kg)  02/07/24 156 lb 9.6 oz (71 kg)  01/10/24 158 lb 12.8 oz (72 kg)     BP Readings from Last 3 Encounters:  07/15/24 130/60  02/07/24 (!) 164/69  01/10/24 138/88     Physical Exam- Limited  Constitutional:  Body mass index is 25.63 kg/m. , not in acute distress, tearful  today Eyes:  EOMI, no exophthalmos Musculoskeletal: no gross deformities, strength intact in all four extremities, no gross restriction of joint movements Skin:  no rashes, no hyperemia Neurological: no tremor with outstretched hands   Diabetic Foot Exam - Simple   No data filed     CMP ( most recent) CMP     Component Value Date/Time   NA 140 02/20/2023 1025   K 4.5 02/20/2023 1025   CL 106 02/20/2023 1025   CO2 18 (L) 02/20/2023 1025   GLUCOSE 121 (H) 02/20/2023 1025   GLUCOSE 162 (H) 08/01/2021 1211   BUN 34 (H) 02/20/2023 1025   CREATININE 2.11 (H) 02/20/2023 1025   CREATININE 2.00 (H) 06/01/2020 1620   CALCIUM  9.3 02/20/2023 1025   PROT 6.6 10/30/2022 1212   ALBUMIN 4.4 10/30/2022 1212   AST 13 10/30/2022 1212   ALT 17 10/30/2022 1212   ALKPHOS 98 10/30/2022 1212   BILITOT 0.3 10/30/2022 1212   GFRNONAA 31 (L) 08/01/2021 1211   GFRNONAA 26 (L) 06/01/2020 1620   GFRAA 35 (L) 09/27/2020 1215   GFRAA 31 (L) 06/01/2020 1620     Diabetic Labs (most recent): Lab Results  Component Value Date   HGBA1C 6 05/29/2024   HGBA1C 6.1 (A) 01/10/2024   HGBA1C 6.4 (A) 06/18/2023   MICROALBUR 138.5 06/01/2020   MICROALBUR 1,036.3 (H) 07/04/2018     Lipid Panel ( most recent) Lipid Panel     Component Value Date/Time   CHOL 207 (H) 02/07/2024 1021   TRIG 242 (H) 02/07/2024 1021   HDL 49 02/07/2024 1021   CHOLHDL 4.2 02/07/2024 1021   CHOLHDL 5.9 08/01/2021 1211   VLDL 50 (H) 08/01/2021 1211   LDLCALC 116 (H) 02/07/2024 1021       Latest Reference Range & Units 03/13/22 11:50 10/30/22 12:12 06/14/23 14:28 07/14/24 14:49  TSH 0.40 - 4.50 mIU/L 1.300 1.540 1.00 1.65  T4,Free(Direct)  0.8 - 1.8 ng/dL 8.52  1.1 1.0  Thyroxine (T4) 4.5 - 12.0 ug/dL  7.5    Free Thyroxine Index 1.2 - 4.9   2.3    T3 Uptake Ratio 24 - 39 %  31      Assessment & Plan:   1) 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  She presents today with no meter, no logs, was not asked to routinely monitor glucose due to safe medication regimen.  Her previsit A1c on 10/17 was 6%, essentially unchanged from prior visit of 6.1%.  She denies any hypoglycemia.  She is under great amount of stress due to family health issues.  -her diabetes is complicated by stage 4 renal insufficiency, sedentary life, heavy smoking and she remains at extremely high risk for more acute and chronic complications which include CAD, CVA, CKD, retinopathy, and neuropathy. These are all discussed in detail with her.  - Nutritional counseling repeated/built upon at each appointment.  - The patient admits there is a room for improvement in their diet and drink choices. -  Suggestion is made for the patient to avoid simple carbohydrates from their diet including Cakes, Sweet Desserts / Pastries, Ice Cream, Soda (diet and regular), Sweet Tea, Candies, Chips, Cookies, Sweet Pastries, Store Bought Juices, Alcohol  in Excess of 1-2 drinks a day, Artificial Sweeteners, Coffee Creamer, and Sugar-free Products. This will help patient to have stable blood glucose  profile and potentially avoid unintended weight gain.   - I encouraged the patient to switch to unprocessed or minimally processed complex starch and increased protein intake (animal or plant source), fruits, and vegetables.   - Patient is advised to stick to a routine mealtimes to eat 3 meals a day and avoid unnecessary snacks (to snack only to correct hypoglycemia).  -Given her stable glycemic profile overall, no changes will  be made to her regimen today.  She is advised to continue Jardiance  10 mg po daily with breakfast per nephrology recommendations.   -She does not need to routinely monitor glucose due to safe regimen.  2) Lipids/HPL: Her most recent lipid panel from 02/07/24 shows uncontrolled LDL of 116 and elevated triglycerides of 242- worsening.  She is advised to restart Lipitor 40 mg po daily at bedtime and Fish oil supplement- refill sent in today.  Side effects and precautions discussed with her.  3) HTN Her blood pressure is controlled to target.  Will defer any med changes to nephrology or PCP.  4) Hypothyroidism: Her previsit TFTs are consistent with appropriate hormone replacement.  She is advised to continue Levothyroxine  50 mcg po daily before breakfast.     - We discussed about the correct intake of her thyroid  hormone, on empty stomach at fasting, with water , separated by at least 30 minutes from breakfast and other medications,  and separated by more than 4 hours from calcium , iron, multivitamins, acid reflux medications (PPIs). -Patient is made aware of the fact that thyroid  hormone replacement is needed for life, dose to be adjusted by periodic monitoring of thyroid  function tests.  5) Chronic heavy smoking: The patient was counseled on the dangers of tobacco use, and was advised to quit.  Reviewed strategies to maximize success, including removing cigarettes and smoking materials from environment.  6) Weight management:  Her Body mass index is 25.63 kg/m.  She is not a candidate for major weight loss.  Exercise and carbs regimen discussed in detail with her.   - I advised patient to maintain close follow up with Theotis Haze ORN, NP for primary care needs.     I spent  27  minutes in the care of the patient today including review of labs from CMP, Lipids, Thyroid  Function, Hematology (current and previous including abstractions from other facilities); face-to-face time discussing  her  blood glucose readings/logs, discussing hypoglycemia and hyperglycemia episodes and symptoms, medications doses, her options of short and long term treatment based on the latest standards of care / guidelines;  discussion about incorporating lifestyle medicine;  and documenting the encounter. Risk reduction counseling performed per USPSTF guidelines to reduce obesity and cardiovascular risk factors.     Please refer to Patient Instructions for Blood Glucose Monitoring and Insulin /Medications Dosing Guide  in media tab for additional information. Please  also refer to  Patient Self Inventory in the Media  tab for reviewed elements of pertinent patient history.  Veva FORBES Combes participated in the discussions, expressed understanding, and voiced agreement with the above plans.  All questions were answered to her satisfaction. she is encouraged to contact clinic should she have any questions or concerns prior to her return visit.   Follow up plan: - Return in about 6 months (around 01/13/2025) for Diabetes F/U with A1c in office, Thyroid  follow up, Previsit labs, Bring meter and logs.  Benton Rio, [facility] [address] [place], [place] [address] Phone: [phone] Fax: [phone]  07/15/2024, 2:35 PM

## 2024-07-18 ENCOUNTER — Other Ambulatory Visit: Payer: Self-pay | Admitting: Nurse Practitioner

## 2024-07-18 DIAGNOSIS — I10 Essential (primary) hypertension: Secondary | ICD-10-CM

## 2024-07-20 ENCOUNTER — Telehealth: Payer: Self-pay | Admitting: Nurse Practitioner

## 2024-07-20 ENCOUNTER — Ambulatory Visit: Admitting: Nurse Practitioner

## 2024-07-20 NOTE — Telephone Encounter (Signed)
 Patient was contacted regarding missed appointment on 12/8. Voicemail left requesting a call back to reschedule.

## 2024-08-31 ENCOUNTER — Telehealth: Payer: Self-pay | Admitting: Nurse Practitioner

## 2024-08-31 NOTE — Telephone Encounter (Signed)
 Pt confirmed appt

## 2024-09-01 ENCOUNTER — Ambulatory Visit: Payer: Self-pay | Attending: Nurse Practitioner | Admitting: Nurse Practitioner

## 2024-09-01 DIAGNOSIS — J329 Chronic sinusitis, unspecified: Secondary | ICD-10-CM | POA: Diagnosis not present

## 2024-09-01 DIAGNOSIS — K219 Gastro-esophageal reflux disease without esophagitis: Secondary | ICD-10-CM | POA: Diagnosis not present

## 2024-09-01 DIAGNOSIS — I10 Essential (primary) hypertension: Secondary | ICD-10-CM

## 2024-09-01 DIAGNOSIS — M5441 Lumbago with sciatica, right side: Secondary | ICD-10-CM

## 2024-09-01 DIAGNOSIS — G8929 Other chronic pain: Secondary | ICD-10-CM | POA: Diagnosis not present

## 2024-09-01 DIAGNOSIS — B9689 Other specified bacterial agents as the cause of diseases classified elsewhere: Secondary | ICD-10-CM

## 2024-09-01 MED ORDER — CYCLOBENZAPRINE HCL 5 MG PO TABS: 5.0000 mg | ORAL_TABLET | Freq: Three times a day (TID) | ORAL | 2 refills | Status: AC | PRN

## 2024-09-01 MED ORDER — HYDRALAZINE HCL 50 MG PO TABS: 50.0000 mg | ORAL_TABLET | Freq: Two times a day (BID) | ORAL | 1 refills | Status: AC

## 2024-09-01 MED ORDER — PANTOPRAZOLE SODIUM 40 MG PO TBEC: 40.0000 mg | DELAYED_RELEASE_TABLET | Freq: Every day | ORAL | 1 refills | Status: AC

## 2024-09-01 MED ORDER — FLUTICASONE PROPIONATE 50 MCG/ACT NA SUSP: 1.0000 | Freq: Every day | NASAL | 6 refills | Status: AC

## 2024-09-01 NOTE — Progress Notes (Signed)
 "  Assessment & Plan:  Sydney Morrow was seen today for medical management of chronic issues.  Diagnoses and all orders for this visit:  Primary hypertension -     hydrALAZINE  (APRESOLINE ) 50 MG tablet; Take 1 tablet (50 mg total) by mouth 2 (two) times daily. Continue all antihypertensives as prescribed.  Reminded to bring in blood pressure log for follow  up appointment.  RECOMMENDATIONS: DASH/Mediterranean Diets are healthier choices for HTN.    Bacterial sinusitis -     fluticasone  (FLONASE ) 50 MCG/ACT nasal spray; Place 1 spray into both nostrils daily. -     doxycycline  (VIBRA -TABS) 100 MG tablet; Take 1 tablet (100 mg total) by mouth 2 (two) times daily for 5 days.  Chronic right-sided low back pain with right-sided sciatica -     cyclobenzaprine  (FLEXERIL ) 5 MG tablet; Take 1 tablet (5 mg total) by mouth 3 (three) times daily as needed for muscle spasms.  GERD without esophagitis Symptoms well controlled.  -     pantoprazole  (PROTONIX ) 40 MG tablet; Take 1 tablet (40 mg total) by mouth daily. INSTRUCTIONS: Avoid GERD Triggers: acidic, spicy or fried foods, caffeine, coffee, sodas,  alcohol  and chocolate.      Patient has been counseled on age-appropriate routine health concerns for screening and prevention. These are reviewed and up-to-date. Referrals have been placed accordingly. Immunizations are up-to-date or declined.    Subjective:   Chief Complaint  Patient presents with   Medical Management of Chronic Issues    Sydney Morrow 64 y.o. female presents to office today for follow up to HTN  She has a past medical history of Acid reflux, DM2, Anemia, History of hiatal hernia, Hypercholesterolemia, Hypertension, and CKD stage 4 (followed by nephrology)   HTN Blood pressure is elevated today.  She is currently prescribed amlodipine  10 mg daily, hyzaar 50-12.5 mg daily, and hydralazine  50 mg BID. She has not been taking all of her medications as prescribed.  BP Readings from  Last 3 Encounters:  09/01/24 (!) 178/75  07/15/24 130/60  02/07/24 (!) 164/69   She is experiencing significant grief following the recent passing of her mother, which occurred less than a month ago. She describes her heart as 'so broken' and mentions crying frequently, sometimes all day. 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 She declines SSRI or referral for grief counseling.  Sinus Pain: Patient complains of facial pain, nasal congestion, post nasal drip, and sinus pressure. Symptoms include congestion, coryza, and cough described as harsh with no fever, chills, night sweats or weight loss. Onset of symptoms was several weeks ago, unchanged since that time. She is drinking plenty of fluids.  Past history is significant for COPD. Patient is a current smoker.   BACK PAIN She has chronic intermittent right sided low back pain that radiates down the back of her leg to her foot and around the front of her leg as well. States this has been ongoing for the past few years.  Denies any injury to back  Review of Systems  Constitutional: Negative.   HENT:  Positive for congestion and sinus pain.   Eyes: Negative.   Respiratory:  Positive for cough.   Cardiovascular: Negative.   Gastrointestinal: Negative.   Genitourinary: Negative.   Musculoskeletal:  Positive for back pain and myalgias.  Neurological: Negative.   Psychiatric/Behavioral:  Positive for depression.      Past Medical History:  Diagnosis Date   Acid reflux    Diabetes (HCC)    History of anemia    History of hiatal hernia    Hypercholesterolemia    Hypertension    Jaw inflammation, right 01/30/2017    Past Surgical History:  Procedure Laterality Date   COLONOSCOPY N/A 03/05/2018   Procedure: COLONOSCOPY;  Surgeon: Harvey Margo CROME, MD;  Location: [facility];  Service: Endoscopy;  Laterality: N/A;  12:00pm   COLONOSCOPY N/A  03/06/2018   Procedure: COLONOSCOPY;  Surgeon: Harvey Margo CROME, MD;  Location: [facility];  Service: Endoscopy;  Laterality: N/A;   DENTAL SURGERY     ESOPHAGOGASTRODUODENOSCOPY  2011   white, raised, somewhat fixed plaques esophageal mucosa concerning for possible Candida esophagitis s/p brushing. Multiple gastric antral ulcerations, multiple small bulbar erosions and ulcerations.    ESOPHAGOGASTRODUODENOSCOPY N/A 03/05/2018   Procedure: ESOPHAGOGASTRODUODENOSCOPY (EGD);  Surgeon: Harvey Margo CROME, MD;  Location: [facility];  Service: Endoscopy;  Laterality: N/A;   ESOPHAGOGASTRODUODENOSCOPY N/A 03/06/2018   Procedure: ESOPHAGOGASTRODUODENOSCOPY (EGD);  Surgeon: Harvey Margo CROME, MD;  Location: [facility];  Service: Endoscopy;  Laterality: N/A;   TOOTH EXTRACTION N/A 02/01/2017   Procedure: FULL DENTAL EXTRACTIONS;  Surgeon: Sheryle Hamilton, DDS;  Location: [facility] OR;  Service: Oral Surgery;  Laterality: N/A;   TUBAL LIGATION      Family History  Problem Relation Age of Onset   Breast cancer Mother    Bronchitis Daughter    Asthma Grandson    Asthma Grandson    Asthma Grandson    Asthma Granddaughter    Colon cancer Neg Hx    Colon polyps Neg Hx     Social History Reviewed with no changes to be made today.   Outpatient Medications Prior to Visit  Medication Sig Dispense Refill   Accu-Chek Softclix Lancets lancets Patient is to check her blood sugar daily before breakfast 100 each 3   amLODipine  (NORVASC ) 10 MG tablet Take 1 tablet (10 mg total) by mouth daily. 90 tablet 1   atorvastatin  (LIPITOR) 40 MG tablet Take 1 tablet (40 mg total) by mouth daily. 90 tablet 3   azelastine  (ASTELIN ) 0.1 % nasal spray Place 1 spray into both nostrils 2 (two) times daily as needed for rhinitis. Use in each nostril as directed 30 mL 11   Blood Glucose Monitoring Suppl (ACCU-CHEK GUIDE ME) w/Device KIT Use to check glucose twice daily 1 kit 0   Cholecalciferol (VITAMIN D ) 125 MCG (5000 UT) CAPS Take  5,000 Int'l Units/kg by mouth daily.     Coenzyme Q10 (CO Q 10 PO) Take by mouth daily.     fexofenadine  (ALLEGRA  ALLERGY ) 60 MG tablet Take 1 tablet (60 mg total) by mouth 2 (two) times daily as needed for allergies or rhinitis. 60 tablet 1   glucose blood (ACCU-CHEK GUIDE) test strip Patient is to check blood sugar daily before breakfast 100 each 3   JARDIANCE  10 MG TABS tablet Take 1 tablet (10 mg total) by mouth daily. 90 tablet 3   levothyroxine  (SYNTHROID ) 50 MCG tablet TAKE 1 TABLET BY MOUTH ONCE DAILY BEFORE BREAKFAST 90 tablet 3   LOKELMA 10 g PACK packet Take 10 g by mouth. Patient takes 1 pack 3 times a week     losartan -hydrochlorothiazide  (HYZAAR) 100-25 MG tablet Take 0.5 tablets by mouth daily. 45 tablet 1   metoprolol  tartrate (LOPRESSOR ) 50 MG tablet Take 1 tablet (50 mg total) by mouth 2 (two) times daily. 180 tablet  1   Omega-3 Fatty Acids (FISH OIL PO) Take by mouth 2 (two) times a day.     sodium bicarbonate  650 MG tablet Take 650 mg by mouth 2 (two) times daily.     TRUEplus Lancets 28G MISC Use as directed. 100 each 11   vitamin B-12 (CYANOCOBALAMIN) 500 MCG tablet Take 500 mcg by mouth daily.     cyclobenzaprine  (FLEXERIL ) 5 MG tablet Take 1 tablet (5 mg total) by mouth 3 (three) times daily as needed for muscle spasms. 30 tablet 0   fluticasone  (FLONASE ) 50 MCG/ACT nasal spray Use 1 spray(s) in each nostril once daily 16 g 0   hydrALAZINE  (APRESOLINE ) 50 MG tablet Take 1 tablet by mouth twice daily 30 tablet 0   pantoprazole  (PROTONIX ) 40 MG tablet Take 1 tablet by mouth once daily 30 tablet 0   No facility-administered medications prior to visit.    Allergies[1]     Objective:    BP (!) 178/75 (BP Location: Left Arm, Patient Position: Sitting, Cuff Size: Small)   Pulse 63   Ht 5' 6 (1.676 m)   Wt 159 lb 12.8 oz (72.5 kg)   LMP 08/13/2014   SpO2 100%   BMI 25.79 kg/m  Wt Readings from Last 3 Encounters:  09/01/24 159 lb 12.8 oz (72.5 kg)  07/15/24 158 lb  12.8 oz (72 kg)  02/07/24 156 lb 9.6 oz (71 kg)    Physical Exam Vitals and nursing note reviewed.  Constitutional:      Appearance: She is well-developed.  HENT:     Head: Normocephalic and atraumatic.     Nose:     Right Turbinates: Enlarged, swollen and pale.     Left Turbinates: Enlarged, swollen and pale.  Cardiovascular:     Rate and Rhythm: Normal rate and regular rhythm.     Heart sounds: Normal heart sounds. No murmur heard.    No friction rub. No gallop.  Pulmonary:     Effort: Pulmonary effort is normal. No tachypnea or respiratory distress.     Breath sounds: Normal breath sounds. No decreased breath sounds, wheezing, rhonchi or rales.  Chest:     Chest wall: No tenderness.  Musculoskeletal:        General: Normal range of motion.     Cervical back: Normal range of motion.  Skin:    General: Skin is warm and dry.  Neurological:     Mental Status: She is alert and oriented to person, place, and time.     Coordination: Coordination normal.  Psychiatric:        Behavior: Behavior normal. Behavior is cooperative.        Thought Content: Thought content normal.        Judgment: Judgment normal.          Patient has been counseled extensively about nutrition and exercise as well as the importance of adherence with medications and regular follow-up. The patient was given clear instructions to go to ER or return to medical center if symptoms don't improve, worsen or new problems develop. The patient verbalized understanding.   Follow-up: Return in about 4 months (around 12/30/2024) for a1c.   Haze LELON Servant, FNP-BC [facility] and [facility] [place], [place] [phone]   09/08/2024, 11:28 AM     [1]  Allergies Allergen Reactions   Aspirin Other (See Comments)    Mouth, upper lip, nose area becomes numb   Morphine  And Codeine Other (See Comments)  Did not feel good   Other     Cats   Peach Flavoring Agent (Non-Screening)      Mouth and body itches.    Penicillins Other (See Comments)    Unknown Has patient had a PCN reaction causing immediate rash, facial/tongue/throat swelling, SOB or lightheadedness with hypotension: Unknown Has patient had a PCN reaction causing severe rash involving mucus membranes or skin necrosis: Unknown Has patient had a PCN reaction that required hospitalization: No Has patient had a PCN reaction occurring within the last 10 years: No If all of the above answers are NO, then may proceed with Cephalosporin use.    "

## 2024-09-08 ENCOUNTER — Encounter: Payer: Self-pay | Admitting: Nurse Practitioner

## 2024-09-08 MED ORDER — DOXYCYCLINE HYCLATE 100 MG PO TABS: 100.0000 mg | ORAL_TABLET | Freq: Two times a day (BID) | ORAL | 0 refills | Status: AC

## 2024-12-30 ENCOUNTER — Ambulatory Visit: Payer: Self-pay | Admitting: Nurse Practitioner

## 2025-01-13 ENCOUNTER — Ambulatory Visit: Admitting: Nurse Practitioner
